# Patient Record
Sex: Male | Born: 1960
Health system: Southern US, Community
[De-identification: ages and names within clinical notes are randomized; demographics above are authoritative.]

## PROBLEM LIST (undated history)

## (undated) DIAGNOSIS — I639 Cerebral infarction, unspecified: Secondary | ICD-10-CM

## (undated) DIAGNOSIS — C61 Malignant neoplasm of prostate: Secondary | ICD-10-CM

## (undated) DIAGNOSIS — G709 Myoneural disorder, unspecified: Secondary | ICD-10-CM

## (undated) DIAGNOSIS — G40909 Epilepsy, unspecified, not intractable, without status epilepticus: Secondary | ICD-10-CM

## (undated) DIAGNOSIS — I6789 Other cerebrovascular disease: Secondary | ICD-10-CM

## (undated) HISTORY — DX: Malignant neoplasm of prostate: C61

---

## 2009-04-27 ENCOUNTER — Ambulatory Visit: Payer: Self-pay

## 2009-05-17 ENCOUNTER — Ambulatory Visit: Payer: Self-pay

## 2009-05-18 ENCOUNTER — Ambulatory Visit: Payer: Self-pay

## 2009-05-28 ENCOUNTER — Ambulatory Visit: Payer: Self-pay

## 2010-03-01 ENCOUNTER — Emergency Department: Payer: Self-pay | Admitting: Emergency Medicine

## 2010-09-09 ENCOUNTER — Emergency Department: Payer: Self-pay | Admitting: Emergency Medicine

## 2010-09-18 ENCOUNTER — Emergency Department: Payer: Self-pay | Admitting: Emergency Medicine

## 2011-05-08 ENCOUNTER — Ambulatory Visit: Payer: Self-pay

## 2011-05-29 ENCOUNTER — Emergency Department: Payer: Self-pay | Admitting: Unknown Physician Specialty

## 2011-08-25 DIAGNOSIS — S5290XA Unspecified fracture of unspecified forearm, initial encounter for closed fracture: Secondary | ICD-10-CM | POA: Diagnosis not present

## 2011-10-23 DIAGNOSIS — S5290XA Unspecified fracture of unspecified forearm, initial encounter for closed fracture: Secondary | ICD-10-CM | POA: Diagnosis not present

## 2011-11-05 ENCOUNTER — Emergency Department: Payer: Self-pay | Admitting: Emergency Medicine

## 2011-11-05 DIAGNOSIS — N201 Calculus of ureter: Secondary | ICD-10-CM | POA: Diagnosis not present

## 2011-11-05 DIAGNOSIS — Z8673 Personal history of transient ischemic attack (TIA), and cerebral infarction without residual deficits: Secondary | ICD-10-CM | POA: Diagnosis not present

## 2011-11-05 DIAGNOSIS — F17200 Nicotine dependence, unspecified, uncomplicated: Secondary | ICD-10-CM | POA: Diagnosis not present

## 2011-11-05 LAB — URINALYSIS, COMPLETE
Bilirubin,UR: NEGATIVE
Ketone: NEGATIVE
Leukocyte Esterase: NEGATIVE
Ph: 5 (ref 4.5–8.0)
RBC,UR: 446 /HPF (ref 0–5)
Squamous Epithelial: <1

## 2011-11-05 LAB — BASIC METABOLIC PANEL
Anion Gap: 8 (ref 7–16)
Calcium, Total: 9 mg/dL (ref 8.5–10.1)
Chloride: 106 mmol/L (ref 98–107)
Creatinine: 0.7 mg/dL (ref 0.60–1.30)
EGFR (African American): >60
EGFR (Non-African Amer.): >60
Glucose: 105 mg/dL — ABNORMAL HIGH (ref 65–99)
Potassium: 3.6 mmol/L (ref 3.5–5.1)

## 2011-11-05 LAB — CBC
HGB: 14.9 g/dL (ref 13.0–18.0)
MCH: 32.6 pg (ref 26.0–34.0)
MCHC: 34 g/dL (ref 32.0–36.0)
MCV: 96 fL (ref 80–100)
Platelet: 177 10*3/uL (ref 150–440)
RBC: 4.57 10*6/uL (ref 4.40–5.90)

## 2011-11-10 DIAGNOSIS — H04129 Dry eye syndrome of unspecified lacrimal gland: Secondary | ICD-10-CM | POA: Insufficient documentation

## 2011-11-10 DIAGNOSIS — D332 Benign neoplasm of brain, unspecified: Secondary | ICD-10-CM | POA: Diagnosis not present

## 2011-11-10 DIAGNOSIS — H47629 Disorders of visual cortex in (due to) inflammatory disorders, unspecified side of brain: Secondary | ICD-10-CM | POA: Diagnosis not present

## 2011-11-10 DIAGNOSIS — H02059 Trichiasis without entropian unspecified eye, unspecified eyelid: Secondary | ICD-10-CM | POA: Insufficient documentation

## 2011-11-13 ENCOUNTER — Ambulatory Visit: Payer: Self-pay | Admitting: Oncology

## 2012-11-06 ENCOUNTER — Emergency Department: Payer: Self-pay | Admitting: Emergency Medicine

## 2012-11-06 DIAGNOSIS — F17200 Nicotine dependence, unspecified, uncomplicated: Secondary | ICD-10-CM | POA: Diagnosis not present

## 2012-11-06 DIAGNOSIS — Z8673 Personal history of transient ischemic attack (TIA), and cerebral infarction without residual deficits: Secondary | ICD-10-CM | POA: Diagnosis not present

## 2012-11-06 DIAGNOSIS — R109 Unspecified abdominal pain: Secondary | ICD-10-CM | POA: Diagnosis not present

## 2012-11-06 DIAGNOSIS — R319 Hematuria, unspecified: Secondary | ICD-10-CM | POA: Diagnosis not present

## 2012-11-06 DIAGNOSIS — R339 Retention of urine, unspecified: Secondary | ICD-10-CM | POA: Diagnosis not present

## 2012-11-06 LAB — URINALYSIS, COMPLETE
Bacteria: NONE SEEN
Bilirubin,UR: NEGATIVE
Glucose,UR: NEGATIVE mg/dL (ref 0–75)
Protein: NEGATIVE
Squamous Epithelial: <1

## 2012-11-06 LAB — BASIC METABOLIC PANEL
Calcium, Total: 8.8 mg/dL (ref 8.5–10.1)
Chloride: 108 mmol/L — ABNORMAL HIGH (ref 98–107)
Co2: 23 mmol/L (ref 21–32)
Creatinine: 0.56 mg/dL — ABNORMAL LOW (ref 0.60–1.30)
EGFR (African American): >60
EGFR (Non-African Amer.): >60
Glucose: 84 mg/dL (ref 65–99)
Osmolality: 277 (ref 275–301)
Potassium: 4 mmol/L (ref 3.5–5.1)

## 2012-11-06 LAB — CBC
MCH: 32.3 pg (ref 26.0–34.0)
MCV: 95 fL (ref 80–100)
RDW: 13.5 % (ref 11.5–14.5)
WBC: 14.6 10*3/uL — ABNORMAL HIGH (ref 3.8–10.6)

## 2014-04-19 DIAGNOSIS — H53459 Other localized visual field defect, unspecified eye: Secondary | ICD-10-CM | POA: Diagnosis not present

## 2014-06-19 DIAGNOSIS — R29898 Other symptoms and signs involving the musculoskeletal system: Secondary | ICD-10-CM | POA: Diagnosis not present

## 2014-06-19 DIAGNOSIS — C719 Malignant neoplasm of brain, unspecified: Secondary | ICD-10-CM | POA: Diagnosis not present

## 2014-06-26 ENCOUNTER — Emergency Department: Payer: Self-pay | Admitting: Student

## 2014-06-26 DIAGNOSIS — N508 Other specified disorders of male genital organs: Secondary | ICD-10-CM | POA: Diagnosis not present

## 2014-06-26 DIAGNOSIS — K8020 Calculus of gallbladder without cholecystitis without obstruction: Secondary | ICD-10-CM | POA: Diagnosis not present

## 2014-06-26 DIAGNOSIS — R109 Unspecified abdominal pain: Secondary | ICD-10-CM | POA: Diagnosis not present

## 2014-06-26 DIAGNOSIS — R319 Hematuria, unspecified: Secondary | ICD-10-CM | POA: Diagnosis not present

## 2014-06-27 DIAGNOSIS — R109 Unspecified abdominal pain: Secondary | ICD-10-CM | POA: Diagnosis not present

## 2014-06-27 DIAGNOSIS — N508 Other specified disorders of male genital organs: Secondary | ICD-10-CM | POA: Diagnosis not present

## 2014-06-27 DIAGNOSIS — K8020 Calculus of gallbladder without cholecystitis without obstruction: Secondary | ICD-10-CM | POA: Diagnosis not present

## 2014-06-27 LAB — COMPREHENSIVE METABOLIC PANEL
ALBUMIN: 3.9 g/dL (ref 3.4–5.0)
ANION GAP: 11 (ref 7–16)
AST: 19 U/L (ref 15–37)
Alkaline Phosphatase: 114 U/L
BILIRUBIN TOTAL: 0.6 mg/dL (ref 0.2–1.0)
BUN: 9 mg/dL (ref 7–18)
CALCIUM: 9.1 mg/dL (ref 8.5–10.1)
CHLORIDE: 101 mmol/L (ref 98–107)
CO2: 24 mmol/L (ref 21–32)
CREATININE: 0.78 mg/dL (ref 0.60–1.30)
EGFR (African American): >60
GLUCOSE: 124 mg/dL — AB (ref 65–99)
Osmolality: 272 (ref 275–301)
Potassium: 3.7 mmol/L (ref 3.5–5.1)
SGPT (ALT): 35 U/L
Sodium: 136 mmol/L (ref 136–145)
TOTAL PROTEIN: 7.3 g/dL (ref 6.4–8.2)

## 2014-06-27 LAB — CBC
HCT: 42.4 % (ref 40.0–52.0)
HGB: 14.4 g/dL (ref 13.0–18.0)
MCH: 31.7 pg (ref 26.0–34.0)
MCHC: 33.8 g/dL (ref 32.0–36.0)
MCV: 94 fL (ref 80–100)
Platelet: 225 10*3/uL (ref 150–440)
RBC: 4.53 10*6/uL (ref 4.40–5.90)
RDW: 13.3 % (ref 11.5–14.5)
WBC: 11 10*3/uL — ABNORMAL HIGH (ref 3.8–10.6)

## 2014-06-27 LAB — URINALYSIS, COMPLETE
Bacteria: NONE SEEN
Bilirubin,UR: NEGATIVE
Glucose,UR: 50 mg/dL (ref 0–75)
Ketone: NEGATIVE
LEUKOCYTE ESTERASE: NEGATIVE
NITRITE: NEGATIVE
PH: 5 (ref 4.5–8.0)
Protein: NEGATIVE
SQUAMOUS EPITHELIAL: NONE SEEN
Specific Gravity: 1.025 (ref 1.003–1.030)
WBC UR: 2 /HPF (ref 0–5)

## 2014-06-28 LAB — URINE CULTURE

## 2014-07-05 DIAGNOSIS — N400 Enlarged prostate without lower urinary tract symptoms: Secondary | ICD-10-CM | POA: Diagnosis not present

## 2014-07-05 DIAGNOSIS — R310 Gross hematuria: Secondary | ICD-10-CM | POA: Diagnosis not present

## 2014-07-12 DIAGNOSIS — G40909 Epilepsy, unspecified, not intractable, without status epilepticus: Secondary | ICD-10-CM | POA: Diagnosis not present

## 2014-07-12 DIAGNOSIS — Z0000 Encounter for general adult medical examination without abnormal findings: Secondary | ICD-10-CM | POA: Diagnosis not present

## 2014-07-12 DIAGNOSIS — C719 Malignant neoplasm of brain, unspecified: Secondary | ICD-10-CM | POA: Diagnosis not present

## 2014-07-12 DIAGNOSIS — R29898 Other symptoms and signs involving the musculoskeletal system: Secondary | ICD-10-CM | POA: Diagnosis not present

## 2014-07-16 DIAGNOSIS — C719 Malignant neoplasm of brain, unspecified: Secondary | ICD-10-CM | POA: Insufficient documentation

## 2014-07-16 DIAGNOSIS — G40909 Epilepsy, unspecified, not intractable, without status epilepticus: Secondary | ICD-10-CM | POA: Insufficient documentation

## 2014-07-16 DIAGNOSIS — R29898 Other symptoms and signs involving the musculoskeletal system: Secondary | ICD-10-CM | POA: Insufficient documentation

## 2014-07-16 DIAGNOSIS — I6789 Other cerebrovascular disease: Secondary | ICD-10-CM

## 2014-08-01 DIAGNOSIS — Z0000 Encounter for general adult medical examination without abnormal findings: Secondary | ICD-10-CM | POA: Diagnosis not present

## 2014-08-15 DIAGNOSIS — R972 Elevated prostate specific antigen [PSA]: Secondary | ICD-10-CM | POA: Diagnosis not present

## 2014-08-22 ENCOUNTER — Ambulatory Visit: Payer: Self-pay | Admitting: Urology

## 2014-09-06 ENCOUNTER — Ambulatory Visit: Payer: Self-pay | Admitting: Urology

## 2014-09-06 DIAGNOSIS — R310 Gross hematuria: Secondary | ICD-10-CM | POA: Diagnosis not present

## 2014-09-06 DIAGNOSIS — Z8673 Personal history of transient ischemic attack (TIA), and cerebral infarction without residual deficits: Secondary | ICD-10-CM | POA: Diagnosis not present

## 2014-09-06 DIAGNOSIS — C7911 Secondary malignant neoplasm of bladder: Secondary | ICD-10-CM | POA: Diagnosis not present

## 2014-09-06 DIAGNOSIS — I69351 Hemiplegia and hemiparesis following cerebral infarction affecting right dominant side: Secondary | ICD-10-CM | POA: Diagnosis not present

## 2014-09-06 DIAGNOSIS — N329 Bladder disorder, unspecified: Secondary | ICD-10-CM | POA: Diagnosis not present

## 2014-09-06 DIAGNOSIS — C61 Malignant neoplasm of prostate: Secondary | ICD-10-CM | POA: Diagnosis not present

## 2014-09-06 DIAGNOSIS — N402 Nodular prostate without lower urinary tract symptoms: Secondary | ICD-10-CM | POA: Diagnosis not present

## 2014-09-06 DIAGNOSIS — N403 Nodular prostate with lower urinary tract symptoms: Secondary | ICD-10-CM | POA: Diagnosis not present

## 2014-09-12 DIAGNOSIS — C61 Malignant neoplasm of prostate: Secondary | ICD-10-CM | POA: Diagnosis not present

## 2014-10-11 ENCOUNTER — Ambulatory Visit: Payer: Self-pay | Admitting: Urology

## 2014-10-11 DIAGNOSIS — M419 Scoliosis, unspecified: Secondary | ICD-10-CM | POA: Diagnosis not present

## 2014-10-11 DIAGNOSIS — M19012 Primary osteoarthritis, left shoulder: Secondary | ICD-10-CM | POA: Diagnosis not present

## 2014-10-11 DIAGNOSIS — C61 Malignant neoplasm of prostate: Secondary | ICD-10-CM | POA: Diagnosis not present

## 2014-10-17 ENCOUNTER — Ambulatory Visit: Payer: Self-pay | Admitting: Urology

## 2014-10-17 DIAGNOSIS — R160 Hepatomegaly, not elsewhere classified: Secondary | ICD-10-CM | POA: Diagnosis not present

## 2014-10-17 DIAGNOSIS — K828 Other specified diseases of gallbladder: Secondary | ICD-10-CM | POA: Diagnosis not present

## 2014-10-17 DIAGNOSIS — C61 Malignant neoplasm of prostate: Secondary | ICD-10-CM | POA: Diagnosis not present

## 2014-10-31 DIAGNOSIS — K7689 Other specified diseases of liver: Secondary | ICD-10-CM | POA: Diagnosis not present

## 2014-10-31 DIAGNOSIS — C61 Malignant neoplasm of prostate: Secondary | ICD-10-CM | POA: Diagnosis not present

## 2014-11-07 ENCOUNTER — Ambulatory Visit
Admit: 2014-11-07 | Disposition: A | Discharge status: Home / Self Care | Payer: Self-pay | Attending: Oncology | Admitting: Oncology

## 2014-11-08 ENCOUNTER — Ambulatory Visit
Admit: 2014-11-08 | Disposition: A | Discharge status: Home / Self Care | Payer: Self-pay | Attending: Oncology | Admitting: Oncology

## 2014-11-08 ENCOUNTER — Ambulatory Visit
Admit: 2014-11-08 | Disposition: A | Discharge status: Home / Self Care | Payer: Self-pay | Attending: Urology | Admitting: Urology

## 2014-11-08 DIAGNOSIS — C787 Secondary malignant neoplasm of liver and intrahepatic bile duct: Secondary | ICD-10-CM | POA: Diagnosis not present

## 2014-11-08 DIAGNOSIS — R911 Solitary pulmonary nodule: Secondary | ICD-10-CM | POA: Diagnosis not present

## 2014-11-08 DIAGNOSIS — J432 Centrilobular emphysema: Secondary | ICD-10-CM | POA: Diagnosis not present

## 2014-11-08 DIAGNOSIS — K769 Liver disease, unspecified: Secondary | ICD-10-CM | POA: Diagnosis not present

## 2014-11-08 DIAGNOSIS — C61 Malignant neoplasm of prostate: Secondary | ICD-10-CM | POA: Diagnosis not present

## 2014-11-08 DIAGNOSIS — K828 Other specified diseases of gallbladder: Secondary | ICD-10-CM | POA: Diagnosis not present

## 2014-11-08 DIAGNOSIS — I2510 Atherosclerotic heart disease of native coronary artery without angina pectoris: Secondary | ICD-10-CM | POA: Diagnosis not present

## 2014-11-08 DIAGNOSIS — C7951 Secondary malignant neoplasm of bone: Secondary | ICD-10-CM | POA: Diagnosis not present

## 2014-11-08 LAB — CBC CANCER CENTER
BASOS ABS: 0.1 x10 3/mm (ref 0.0–0.1)
Basophil %: 0.6 %
Eosinophil #: 0.1 x10 3/mm (ref 0.0–0.7)
Eosinophil %: 0.9 %
HCT: 44.5 % (ref 40.0–52.0)
HGB: 15.1 g/dL (ref 13.0–18.0)
LYMPHS PCT: 17.5 %
Lymphocyte #: 2 x10 3/mm (ref 1.0–3.6)
MCH: 31.2 pg (ref 26.0–34.0)
MCHC: 34 g/dL (ref 32.0–36.0)
MCV: 92 fL (ref 80–100)
Monocyte #: 0.8 x10 3/mm (ref 0.2–1.0)
Monocyte %: 7.4 %
NEUTROS ABS: 8.3 x10 3/mm — AB (ref 1.4–6.5)
Neutrophil %: 73.6 %
PLATELETS: 250 x10 3/mm (ref 150–440)
RBC: 4.84 10*6/uL (ref 4.40–5.90)
RDW: 13.2 % (ref 11.5–14.5)
WBC: 11.2 x10 3/mm — AB (ref 3.8–10.6)

## 2014-11-08 LAB — COMPREHENSIVE METABOLIC PANEL
ANION GAP: 7 (ref 7–16)
Albumin: 4.1 g/dL
Alkaline Phosphatase: 137 U/L — ABNORMAL HIGH
BILIRUBIN TOTAL: 0.5 mg/dL
BUN: 12 mg/dL
CHLORIDE: 105 mmol/L
CO2: 22 mmol/L
CREATININE: 0.59 mg/dL — AB
Calcium, Total: 9.2 mg/dL
EGFR (African American): >60
Glucose: 111 mg/dL — ABNORMAL HIGH
Potassium: 3.7 mmol/L
SGOT(AST): 27 U/L
SGPT (ALT): 26 U/L
Sodium: 134 mmol/L — ABNORMAL LOW
Total Protein: 7.3 g/dL

## 2014-11-08 LAB — APTT: Activated PTT: 28.9 secs (ref 23.6–35.9)

## 2014-11-08 LAB — PROTIME-INR
INR: 0.9
Prothrombin Time: 12.8 secs

## 2014-11-09 LAB — AFP TUMOR MARKER: AFP-Tumor Marker: 2.9 ng/mL

## 2014-11-09 LAB — CEA: CEA: 2.6 ng/mL

## 2014-11-20 ENCOUNTER — Ambulatory Visit
Admit: 2014-11-20 | Disposition: A | Discharge status: Home / Self Care | Payer: Self-pay | Attending: Oncology | Admitting: Oncology

## 2014-11-20 DIAGNOSIS — R160 Hepatomegaly, not elsewhere classified: Secondary | ICD-10-CM | POA: Diagnosis not present

## 2014-11-20 DIAGNOSIS — C61 Malignant neoplasm of prostate: Secondary | ICD-10-CM | POA: Diagnosis not present

## 2014-11-20 DIAGNOSIS — F17200 Nicotine dependence, unspecified, uncomplicated: Secondary | ICD-10-CM | POA: Diagnosis not present

## 2014-11-20 DIAGNOSIS — K769 Liver disease, unspecified: Secondary | ICD-10-CM | POA: Diagnosis not present

## 2014-11-20 DIAGNOSIS — Z8673 Personal history of transient ischemic attack (TIA), and cerebral infarction without residual deficits: Secondary | ICD-10-CM | POA: Diagnosis not present

## 2014-11-20 DIAGNOSIS — Z85841 Personal history of malignant neoplasm of brain: Secondary | ICD-10-CM | POA: Diagnosis not present

## 2014-11-20 DIAGNOSIS — K7581 Nonalcoholic steatohepatitis (NASH): Secondary | ICD-10-CM | POA: Diagnosis not present

## 2014-11-20 LAB — SURGICAL PATHOLOGY

## 2014-11-23 ENCOUNTER — Ambulatory Visit
Admit: 2014-11-23 | Disposition: A | Discharge status: Home / Self Care | Payer: Self-pay | Attending: Oncology | Admitting: Oncology

## 2014-11-23 LAB — SURGICAL PATHOLOGY

## 2014-11-24 ENCOUNTER — Other Ambulatory Visit: Payer: Self-pay | Admitting: Oncology

## 2014-11-24 DIAGNOSIS — K769 Liver disease, unspecified: Secondary | ICD-10-CM

## 2014-11-26 NOTE — Op Note (Signed)
PATIENT NAME:  Roger Sparks, Roger Sparks MR#:  [id number] DATE OF BIRTH:  Dec 05, 1960  DATE OF PROCEDURE:  09/06/2014  PREOPERATIVE DIAGNOSIS: Nodular prostate, hematuria.   POSTOPERATIVE DIAGNOSES: Nodular prostate, hematuria, bladder and prostatic urethral mass.     PROCEDURE PERFORMED:  Cystoscopy, bladder prostatic urethral biopsy, transrectal ultrasound guided prostate biopsy.   ANESTHESIA: General anesthesia.   ATTENDING SURGEON: Sherlynn Stalls, MD.   ESTIMATED BLOOD LOSS: Minimal.   DRAINS: None.   COMPLICATIONS: None.   SPECIMENS: Bladder/prostatic urethral biopsy, standard 12 core prostate biopsy, 4 random prostate biopsies.   INDICATION: This is a 54 year old male who presented for evaluation for a history of nodular prostate and elevated PSA to 6.2, he also has a history of hematuria. He underwent attempted transrectal biopsy of his prostate in the office, however was unable to tolerate this and now presents to have this done under anesthesia in the operating room. Risks and benefits of the procedure were explained in detail to the patient who agreed to proceed as planned.   PROCEDURE: The patient was correctly identified in the preoperative holding area and informed consent was confirmed. He was brought to the operating suite and placed on the table in the supine position. At this time universal timeout protocol was performed. All team members were identified. Venodyne boots were placed and he was administered 500 mg of IV Levaquin and 80 mg of IV gentamicin in the preoperative period. He was then placed under general anesthesia, repositioned lower in the bed in the dorsal lithotomy position, and prepped and draped in standard surgical fashion. Given his history of hematuria the plan was to proceed with cystoscopy and bilateral retrogrades.  At this point in time a rigid cystoscope using a 42 French access sheath was advanced per urethra into the bladder.  Within the prostatic urethra there  was an unusual-appearing prostatic fossa mass on the patient's left side, which appeared whitish-colored, round, circular, cluster type lesion with a mucosal overlay. This mass was large and had some good mass effect within the prostatic urethra. This extended all the way up into the bladder neck on the left side. Within the bladder several additional satellite lesions of this mass were seen on the right hemi-trigone which were on a stalk with this white clustered appearance covered in mucosa, they appeared much like fish eggs in appearance, there were 2-3 satellite lesions within the bladder in addition to the prostatic urethra. The remainder of the bladder was unremarkable. Given the mass effect and these lesions I was unable to identify the UOs to do bilateral retrograde pyelogram. At this point in time we did go ahead and biopsy several areas using cold cup biopsy forceps. The base of these lesions were fulgurated using Bugbee for hemostasis. I did not completely transect these lesions as given the unusual appearance it was very unclear what the pathologic diagnosis was at this point and also unclear if he would benefit from resection at this time given the good amount of hematuria at this point despite the Bugbee.  I did go ahead and place an 26 [place] Coude catheter for the purpose of urinary drainage and clot irrigation. The scope was then removed. The patient was repositioned in the lateral decubitus position with the left side down and his knees were brought to his chest. A transrectal ultrasound probe was then introduced per rectum. At this point in time due to some technical difficulties with the ultrasound probe we went ahead and took four random prostate biopsy  cores which were passed off the field as random prostate biopsies. After some adjustment to the ultrasound device we were eventually able to get the image adjusted in order to perform a standard 12 core prostate biopsy including 6 biopsies on the  left and right, starting at the base down to the apex including lateral cores.  I was unable to obtain a prostate volume at this time. Of note prior to performing the transrectal ultrasound I did perform a rectal exam which was grossly abnormal, firm prostate with multiple concerning nodules throughout the prostate bilaterally. The prostate was approximately 50 mL on examination. The patient was then returned to the supine position, reversed from anesthesia and taken to the PACU in stable condition. Of note I did remove the Foley catheter prior to extubating the patient as his urine had nearly completely cleared and the catheter was not deemed necessary.   PLAN: The patient will follow up in 1 week to discuss his pathology results. He will also eventually need a CT urogram to evaluate his upper tracts as this workup was incomplete given the inability to identify the UOs bilaterally. Of note in the PACU the patient did void a small amount, but had a markedly elevated PVR to nearly 700 mL, therefore a 16 French Coude catheter was replaced. This will be removed at the time of followup next week.     ____________________________ Sherlynn Stalls, MD ajb:bu D: 09/07/2014 15:07:56 ET T: 09/07/2014 19:35:52 ET JOB#: [id number]  cc: Sherlynn Stalls, MD, <Dictator> Sherlynn Stalls MD ELECTRONICALLY SIGNED 09/19/2014 12:37

## 2014-11-28 ENCOUNTER — Other Ambulatory Visit: Payer: Self-pay | Admitting: Radiology

## 2014-11-28 ENCOUNTER — Other Ambulatory Visit: Payer: Self-pay | Admitting: Interventional Radiology

## 2014-11-29 ENCOUNTER — Ambulatory Visit
Admission: RE | Admit: 2014-11-29 | Discharge: 2014-11-29 | Disposition: A | Discharge status: Home / Self Care | Payer: Medicare Other | Source: Ambulatory Visit | Attending: Oncology | Admitting: Oncology

## 2014-11-29 DIAGNOSIS — C787 Secondary malignant neoplasm of liver and intrahepatic bile duct: Secondary | ICD-10-CM | POA: Diagnosis not present

## 2014-11-29 DIAGNOSIS — G709 Myoneural disorder, unspecified: Secondary | ICD-10-CM | POA: Insufficient documentation

## 2014-11-29 DIAGNOSIS — K769 Liver disease, unspecified: Secondary | ICD-10-CM | POA: Diagnosis not present

## 2014-11-29 DIAGNOSIS — C61 Malignant neoplasm of prostate: Secondary | ICD-10-CM | POA: Diagnosis not present

## 2014-11-29 DIAGNOSIS — Z8546 Personal history of malignant neoplasm of prostate: Secondary | ICD-10-CM | POA: Diagnosis not present

## 2014-11-29 DIAGNOSIS — Z8673 Personal history of transient ischemic attack (TIA), and cerebral infarction without residual deficits: Secondary | ICD-10-CM | POA: Insufficient documentation

## 2014-11-29 DIAGNOSIS — K7689 Other specified diseases of liver: Secondary | ICD-10-CM | POA: Diagnosis present

## 2014-11-29 HISTORY — DX: Myoneural disorder, unspecified: G70.9

## 2014-11-29 HISTORY — DX: Cerebral infarction, unspecified: I63.9

## 2014-11-29 MED ORDER — FENTANYL CITRATE (PF) 100 MCG/2ML IJ SOLN
INTRAMUSCULAR | Status: AC
  Filled 2014-11-29: qty 2

## 2014-11-29 MED ORDER — MIDAZOLAM HCL 5 MG/5ML IJ SOLN
INTRAMUSCULAR | Status: AC | PRN
  Administered 2014-11-29 (×2): 1 mg via INTRAVENOUS

## 2014-11-29 MED ORDER — FENTANYL CITRATE (PF) 100 MCG/2ML IJ SOLN
INTRAMUSCULAR | Status: AC | PRN
  Administered 2014-11-29: 50 ug via INTRAVENOUS

## 2014-11-29 MED ORDER — MIDAZOLAM HCL 5 MG/5ML IJ SOLN
INTRAMUSCULAR | Status: AC
  Filled 2014-11-29: qty 5

## 2014-11-29 MED ORDER — SODIUM CHLORIDE 0.9 % IV SOLN
INTRAVENOUS | Status: DC
  Administered 2014-11-29: 09:00:00 via INTRAVENOUS

## 2014-11-29 NOTE — Consult Note (Signed)
Chief Complaint: No chief complaint on file.   Referring Physician(s): Choksi,Janak  History of Present Illness: Roger Sparks. is a 54 y.o. male with liver lesions. He underwent recent liver biopsy which was negative for malignancy. Repeat biopsy is requested.  Past Medical History  Diagnosis Date  . Stroke     at 54 years of age  . Neuromuscular disorder     brain tumor at 54 years of age  . Cancer     No past surgical history on file.  Allergies: Review of patient's allergies indicates no known allergies.  Medications: none Prior to Admission medications   Not on File     No family history on file.  History   Social History  . Marital Status: Married    Spouse Name: N/A  . Number of Children: N/A  . Years of Education: N/A   Social History Main Topics  . Smoking status: Not on file  . Smokeless tobacco: Not on file  . Alcohol Use: Not on file  . Drug Use: Not on file  . Sexual Activity: Not on file   Other Topics Concern  . Not on file   Social History Narrative  . No narrative on file     Review of Systems: A 12 point ROS discussed and pertinent positives are indicated in the HPI above.  All other systems are negative.  Review of Systems  Vital Signs: BP 125/91 mmHg  Pulse 95  Temp(Src) 98.4 F (36.9 C) (Oral)  Ht 5\' 7"  (1.702 m)  Wt 175 lb (79.379 kg)  BMI 27.40 kg/m2  Physical Exam  Constitutional: He is oriented to person, place, and time. He appears well-developed and well-nourished.  HENT:  Head: Normocephalic.  Neck: Normal range of motion. Neck supple.  Cardiovascular: Normal rate and regular rhythm.   Pulmonary/Chest: Effort normal and breath sounds normal.  Neurological: He is alert and oriented to person, place, and time.  RUE weakness  Skin: Skin is warm and dry.    Mallampati Score: 1    Imaging: US Thyroid Biopsy  11/20/2014   CLINICAL DATA:  Hepatic lesions. Current history of prostate cancer.  EXAM:  ULTRASOUND GUIDED core BIOPSY OF right hepatic lobe lesion.  MEDICATIONS: 3.0 mg IV Versed; 125 mcg IV Fentanyl  Total Moderate Sedation Time: 30 minutes.  PROCEDURE: The procedure, risks, benefits, and alternatives were explained to the patient, including infection, bleeding, pain and pneumothorax requiring chest tube placement and hospitalization. Questions regarding the procedure were encouraged and answered. The patient understands and consents to the procedure.  The right mid axillary line region was prepped with chlorhexidine in a sterile fashion, and a sterile drape was applied covering the operative field. Sterile gloves were used for the procedure. Local anesthesia was provided with 1% Lidocaine.  Under real-time ultrasound guidance, 17 gauge guiding needle was directed toward lesion in right hepatic lobe seen on prior CT scan. The needle appeared to be directed toward the inferior portion of the lesion. Two core samples were obtained using 18 gauge needle. Attempts were then made to direct the needle toward the more central portion of the lesion. However, the bulk of the lesion was underneath the rib making visualization difficult at the time of the procedure, and the procedure was discontinued as patient was developing abdominal pain. Consideration was then given to performing biopsy under CT guidance, but the scanner was not available as it was not operational today due to maintenance issues. Also,  Gel-Foam was not administered as the needle came out of hepatic parenchyma in attempting to redirect the needle tip. Appropriate dressing was applied.  COMPLICATIONS: Patient developed pain during procedure. Vital signs remain stable throughout the procedure.  FINDINGS: Right hepatic lesion as seen on prior CT scan.  IMPRESSION: Under real-time ultrasound guidance, biopsy was performed of right hepatic lesion. However, it was difficult to completely visualize lesion due to its position underneath rib. Two core  samples were obtained of what appears to be the inferior portion of the lesion. See above regarding attempts to direct more centrally. If pathology results are negative for malignancy or neoplasm, repeat biopsy under CT guidance with then be recommended.   Electronically Signed   By: Marijo Conception, M.D.   On: 11/20/2014 09:54    Labs:  CBC:  Recent Labs  06/27/14 0222 11/08/14 1009  WBC 11.0* 11.2*  HGB 14.4 15.1  HCT 42.4 44.5  PLT 225 250    COAGS:  Recent Labs  11/08/14 1009  INR 0.9  APTT 28.9    BMP:  Recent Labs  06/27/14 0222 11/08/14 1009  NA 136 134*  K 3.7 3.7  CL 101 105  CO2 24 22  GLUCOSE 124* 111*  BUN 9 12  CALCIUM 9.1 9.2  CREATININE 0.78 0.59*  GFRNONAA  --  >60  GFRAA  --  >60    LIVER FUNCTION TESTS:  Recent Labs  06/27/14 0222 11/08/14 1009  AST 19 27  ALT 35 26  ALKPHOS 114 137*  PROT 7.3 7.3  ALBUMIN 3.9 4.1    TUMOR MARKERS:  Recent Labs  11/08/14 1009  AFPTM 2.9  CEA 2.6    Assessment and Plan:  Here for liver biopsy.  Thank you for this interesting consult.  I greatly enjoyed meeting Roger Sparks. and look forward to participating in their care.  Signed: Anuj Summons, ART A 11/29/2014, 7:53 AM   I spent a total of 10 Minutes in face to face in clinical consultation, greater than 50% of which was counseling/coordinating care for liver lesions.

## 2014-11-29 NOTE — Procedures (Signed)
R lobe liver lesion Bx 18 g core times three No comp

## 2014-12-04 LAB — SURGICAL PATHOLOGY

## 2014-12-07 ENCOUNTER — Inpatient Hospital Stay: Payer: Medicare Other | Attending: Oncology | Admitting: Oncology

## 2014-12-07 ENCOUNTER — Telehealth: Payer: Self-pay

## 2014-12-07 VITALS — BP 114/84 | HR 90 | Wt 163.6 lb

## 2014-12-07 DIAGNOSIS — K7581 Nonalcoholic steatohepatitis (NASH): Secondary | ICD-10-CM | POA: Diagnosis not present

## 2014-12-07 DIAGNOSIS — C787 Secondary malignant neoplasm of liver and intrahepatic bile duct: Secondary | ICD-10-CM | POA: Diagnosis not present

## 2014-12-07 DIAGNOSIS — F17210 Nicotine dependence, cigarettes, uncomplicated: Secondary | ICD-10-CM

## 2014-12-07 DIAGNOSIS — Z8673 Personal history of transient ischemic attack (TIA), and cerebral infarction without residual deficits: Secondary | ICD-10-CM | POA: Insufficient documentation

## 2014-12-07 DIAGNOSIS — C61 Malignant neoplasm of prostate: Secondary | ICD-10-CM | POA: Diagnosis not present

## 2014-12-07 DIAGNOSIS — Z85841 Personal history of malignant neoplasm of brain: Secondary | ICD-10-CM | POA: Diagnosis not present

## 2014-12-07 DIAGNOSIS — K769 Liver disease, unspecified: Secondary | ICD-10-CM | POA: Diagnosis not present

## 2014-12-07 NOTE — Telephone Encounter (Signed)
Called Mr Pickrel to confirm appt today at 11:00. Pt confirmed. Readback performed.

## 2014-12-07 NOTE — Progress Notes (Signed)
At present Roger Sparks is refusing any further treatment, including his Lupron injections, stating he doesn't want anything else in his body. Educated him at length about port a caths and how they used. Showed and allowed him to handle a port a cath model. Explained process of accessing the port including numbing skin being accessing. Spoke with his wife, who states she wants him to get treatment but that it is his decision. He is to think about it and call me if he changes his mind regarding treatment. Notified Dr Erlene Quan as well.

## 2014-12-09 ENCOUNTER — Encounter: Payer: Self-pay | Admitting: Oncology

## 2014-12-09 DIAGNOSIS — C61 Malignant neoplasm of prostate: Secondary | ICD-10-CM

## 2014-12-09 DIAGNOSIS — C7951 Secondary malignant neoplasm of bone: Secondary | ICD-10-CM

## 2014-12-09 HISTORY — DX: Malignant neoplasm of prostate: C61

## 2014-12-09 NOTE — Progress Notes (Signed)
Roger Sparks @ [facility] [phone]  Fax:(336) [place]. OB: 01/27/1961  MR#: [id number]  JYN#:[id number]  Patient Care Team: No Pcp Per Patient as PCP - General (General Practice) Clent Jacks, RN as Registered Nurse  CHIEF COMPLAINT:  Chief Complaint  Patient presents with  . Follow-up    Oncology History   Carcinoma prostate adenocarcinoma in multiple fragments.  Gleason grade 8.  Involving neutral space.  Large duct and S cylinder-type.  Poorly differentiated.  Lateral neck involvement.Marland Kitchen PSAs low 2.4.  CT scan of abdomen was abnormal with multiple liver lesions.  Diagnosis in April of 2016 Clinical staging T1 cN0 M1 stage IV disease 2, CT-guided liver biopsies consistent with metastases from the prostate cancer        Cancer of prostate   12/09/2014 Initial Diagnosis Cancer of prostate    No flowsheet data found.  INTERVAL HISTORY: 54 year old gentleman went to liver biopsy which was consistent with metastases from the prostate gland came today for the follow-up.  Patient has a progressive disease even on Lupron.  Patient does not want androgen deprivation therapy.  Says that it feels like cement going to his body.  A very difficult patient with lack of understanding of the disease process. Here to discuss the results and further planning of treatment  REVIEW OF SYSTEMS:   Gen. status: Very angry and frustrated patient not any acute distress HEENT: No difficulty swallowing no soreness in the mouth.  No headache.  Cardiac: No chest pain or shortness of breath lungs: No cough.  Continues to smoke.  GI: No nausea no vomiting or diarrhea GU: No hematuria no dysuria skin: No rash.  Musculoskeletal system no bony As per HPI. Otherwise, a complete review of systems is negatve neurological system: Weakness in the right upper and lower extremity.  PAST MEDICAL HISTORY: Past Medical History  Diagnosis Date  . Stroke     at 54 years of age  .  Neuromuscular disorder     brain tumor at 54 years of age  . Cancer   . Cancer of prostate 12/09/2014    PAST SURGICAL HISTORY: No past surgical history on file.  FAMILY HISTORY No family history on file.  GYNECOLOGIC HISTORY:  No LMP for male patient.     ADVANCED DIRECTIVES:   does not have advanced directive  HEALTH MAINTENANCE: History  Substance Use Topics  . Smoking status: Current Every Day Smoker -- 0.50 packs/day    Types: Cigarettes  . Smokeless tobacco: Not on file  . Alcohol Use: No     Colonoscopy:  PAP:  Bone density:  Lipid panel:  No Known Allergies  No current outpatient prescriptions on file.   No current facility-administered medications for this visit.    OBJECTIVE:  Filed Vitals:   12/07/14 1109  BP: 114/84  Pulse: 90     Body mass index is 25.61 kg/(m^2).    ECOG FS:1 - Symptomatic but completely ambulatory  PHYSICAL EXAM: GENERAL:  Well developed, well nourished, sitting comfortably in the exam room in no acute distress. MENTAL STATUS:  Alert and oriented to person, place and time. HEAD:  No abnormality detected ENT:  Oropharynx clear without lesion.  Tongue normal. Mucous membranes moist.  RESPIRATORY:  Clear to auscultation without rales, wheezes or rhonchi. CARDIOVASCULAR:  Regular rate and rhythm without murmur, rub or gallop. . ABDOMEN:  Soft, non-tender, with active bowel sounds, and no hepatosplenomegaly.  No masses. BACK:  No  CVA tenderness.  No tenderness on percussion of the back or rib cage. SKIN:  No rashes, ulcers or lesions. EXTREMITIES: No edema, no skin discoloration or tenderness.  No palpable cords. LYMPH NODES: No palpable cervical, supraclavicular, axillary or inguinal adenopathy  NEUROLOGICAL: Weakness in the right side PSYCH:  Appropriate.   LAB RESULTS:  No visits with results within 3 Day(s) from this visit. Latest known visit with results is:  Hospital Outpatient Visit on 11/20/2014  Component Date  Value Ref Range Status  . SURGICAL PATHOLOGY 11/20/2014    Final                   Value:Surgical Procedure CASE: [id number] PATIENT: Roger Sparks Surgical Pathology Report     SPECIMEN SUBMITTED: A. Liver, biopsy  CLINICAL HISTORY: None provided  PRE-OPERATIVE DIAGNOSIS:   POST-OPERATIVE DIAGNOSIS: None provided     DIAGNOSIS: LIVER MASS; NEEDLE BIOPSIES: - STEATOHEPATITIS WITH MILD ACTIVITY. - INCREASED STAINABLE IRON (3+/4+) IN HEPATOCYTES AND KUPFFER CELLS. - ANISONUCLEOSIS OF HEPATOCYTES, MILD TO MODERATE. - NO INCREASED FIBROSIS OR MALIGNANCY SEEN.  Comment: The following stains were performed and the results incorporated into the final diagnosis: iron, trichrome, and reticulin. The controls stain appropriately.   GROSS DESCRIPTION: A. Labeled: Liver mass Tissue Fragment(s): 2 Measurement: 1.2-1.5 cm in length by 0.1 cm in diameter Comment: Pink red cylindrically shaped tissue fragments  Entirely submitted in cassette(s): 1-2    Final Diagnosis performed by Hassan Buckler, MD.  Electronically signed 11/23/2014 12:31:2                         8PM    The electronic signature indicates that the named Attending Pathologist has evaluated the specimen  Technical component performed at [facility], [address], [place], [place] [address] Lab: [phone] Dir: Darrick Penna. Evette Doffing, MD  Professional component performed at [facility], [facility], [place], [place], [place] [address] Lab: [phone] Dir: Dellia Nims. Rubinas, MD      No results found for: LABCA2 No results found for: CA199 Lab Results  Component Value Date   CEA 2.6 11/08/2014   No results found for: PSA No results found for: CA125   STUDIES: US Thyroid Biopsy  11/20/2014   CLINICAL DATA:  Hepatic lesions. Current history of prostate cancer.  EXAM: ULTRASOUND GUIDED core BIOPSY OF right hepatic lobe lesion.  MEDICATIONS: 3.0 mg IV Versed; 125 mcg IV  Fentanyl  Total Moderate Sedation Time: 30 minutes.  PROCEDURE: The procedure, risks, benefits, and alternatives were explained to the patient, including infection, bleeding, pain and pneumothorax requiring chest tube placement and hospitalization. Questions regarding the procedure were encouraged and answered. The patient understands and consents to the procedure.  The right mid axillary line region was prepped with chlorhexidine in a sterile fashion, and a sterile drape was applied covering the operative field. Sterile gloves were used for the procedure. Local anesthesia was provided with 1% Lidocaine.  Under real-time ultrasound guidance, 17 gauge guiding needle was directed toward lesion in right hepatic lobe seen on prior CT scan. The needle appeared to be directed toward the inferior portion of the lesion. Two core samples were obtained using 18 gauge needle. Attempts were then made to direct the needle toward the more central portion of the lesion. However, the bulk of the lesion was underneath the rib making visualization difficult at the time of the procedure, and the procedure was discontinued as patient was developing abdominal pain. Consideration was  then given to performing biopsy under CT guidance, but the scanner was not available as it was not operational today due to maintenance issues. Also, Gel-Foam was not administered as the needle came out of hepatic parenchyma in attempting to redirect the needle tip. Appropriate dressing was applied.  COMPLICATIONS: Patient developed pain during procedure. Vital signs remain stable throughout the procedure.  FINDINGS: Right hepatic lesion as seen on prior CT scan.  IMPRESSION: Under real-time ultrasound guidance, biopsy was performed of right hepatic lesion. However, it was difficult to completely visualize lesion due to its position underneath rib. Two core samples were obtained of what appears to be the inferior portion of the lesion. See above regarding  attempts to direct more centrally. If pathology results are negative for malignancy or neoplasm, repeat biopsy under CT guidance with then be recommended.   Electronically Signed   By: Marijo Conception, M.D.   On: 11/20/2014 09:54   Ct Biopsy  11/29/2014   CLINICAL DATA:  liver lesion  EXAM: CT-GUIDED BIOPSY OF A RIGHT LOBE LIVER LESION.  CORE.  MEDICATIONS AND MEDICAL HISTORY: Versed two mg, Fentanyl 50 mcg.  Additional Medications: None.  ANESTHESIA/SEDATION: Moderate sedation time: 10 minutes  PROCEDURE: The procedure, risks, benefits, and alternatives were explained to the patient. Questions regarding the procedure were encouraged and answered. The patient understands and consents to the procedure.  The right upper quadrant was prepped with Betadine in a sterile fashion, and a sterile drape was applied covering the operative field. A sterile gown and sterile gloves were used for the procedure.  Under CT guidance, a(n) 17 gauge guide needle was advanced into the right lobe liver lesion. Subsequently 3 18 gauge core biopsies were obtained. Gel-Foam slurry was injected into the tract. The guide needle was removed. Final imaging was performed.  Patient tolerated the procedure well without complication. Vital sign monitoring by nursing staff during the procedure will continue as patient is in the special procedures unit for post procedure observation.  FINDINGS: The images document guide needle placement within the right lobe liver lesion. Post biopsy images demonstrate no hemorrhage.  COMPLICATIONS: None  IMPRESSION: Successful CT-guided core biopsy of a right lobe liver lesion.   Electronically Signed   By: Marybelle Killings M.D.   On: 11/29/2014 12:59    ASSESSMENT: Carcinoma prostate with metastases to liver biopsy on May 4 is consistent with prostatitic carcinoma. Previous history of glioma  MEDICAL DECISION MAKING:  I reviewed pathology report I discussed situation with Dr. Elson Areas pathologist.   Started discussing possibility of requiring chemotherapy patient was adamant that does not want to get a port placed.  Patient does not want any androgen deprivation therapy.  We went on discussing all options possibility of using ZYTIGA or XTANDI should be considered.  Taxotere would be a better choice at present time in addition to androgen deprivation therapy After prolonged discussion patient and family to make decision. Total duration of visit was 45 minutes.  50% or more time was spent in counseling patient and family regarding prognosis and options of treatment and available resources  Patient expressed understanding and was in agreement with this plan. He also understands that He can call clinic at any time with any questions, concerns, or complaints.    Cancer of prostate   Staging form: Prostate, AJCC 7th Edition     Clinical: Stage IV (T1c, N0, M1, PSA: Less than 10, Gleason 8-10 - Poorly differentiated/undifferentiated (marked anaplasia)) - Signed by Forest Gleason, MD  on 12/09/2014   Forest Gleason, MD   12/09/2014 8:00 AM

## 2014-12-13 ENCOUNTER — Telehealth: Payer: Self-pay

## 2014-12-13 ENCOUNTER — Inpatient Hospital Stay (HOSPITAL_BASED_OUTPATIENT_CLINIC_OR_DEPARTMENT_OTHER): Payer: Medicare Other | Admitting: Oncology

## 2014-12-13 VITALS — BP 121/85 | HR 73 | Temp 96.6°F | Wt 163.4 lb

## 2014-12-13 DIAGNOSIS — K7581 Nonalcoholic steatohepatitis (NASH): Secondary | ICD-10-CM

## 2014-12-13 DIAGNOSIS — Z8673 Personal history of transient ischemic attack (TIA), and cerebral infarction without residual deficits: Secondary | ICD-10-CM

## 2014-12-13 DIAGNOSIS — Z85841 Personal history of malignant neoplasm of brain: Secondary | ICD-10-CM

## 2014-12-13 DIAGNOSIS — C787 Secondary malignant neoplasm of liver and intrahepatic bile duct: Secondary | ICD-10-CM | POA: Diagnosis not present

## 2014-12-13 DIAGNOSIS — C61 Malignant neoplasm of prostate: Secondary | ICD-10-CM

## 2014-12-13 DIAGNOSIS — F17210 Nicotine dependence, cigarettes, uncomplicated: Secondary | ICD-10-CM | POA: Diagnosis not present

## 2014-12-13 DIAGNOSIS — K769 Liver disease, unspecified: Secondary | ICD-10-CM | POA: Diagnosis not present

## 2014-12-13 NOTE — Telephone Encounter (Signed)
Oncology Nurse Navigator Documentation  Oncology Nurse Navigator Flowsheets 12/13/2014  Navigator Encounter Type Telephone  Time Spent with Patient 30   Spoke with Roger Sparks on the phone. He is able to come in today to discuss Zytiga with Dr Oliva Bustard as a treatment option. Appt arranged for 1530. He was made aware.

## 2014-12-20 ENCOUNTER — Telehealth: Payer: Self-pay | Admitting: *Deleted

## 2014-12-20 DIAGNOSIS — C61 Malignant neoplasm of prostate: Secondary | ICD-10-CM

## 2014-12-20 NOTE — Telephone Encounter (Signed)
Start zytiga and prednisone today, appt to see md in 4 weeks with CBC, METC , PSA. Returned call to wife who said he will start it in the morning and will wait to here from scheduler for appt

## 2014-12-24 ENCOUNTER — Encounter: Payer: Self-pay | Admitting: Oncology

## 2014-12-24 NOTE — Progress Notes (Signed)
[place] @ [facility] [phone]  Fax:([phone] [place]. OB: [birth year]  MR#: [id number]  JQZ#:[id number]  Patient Care Team: No Pcp Per Patient as PCP - General (General Practice) Clent Jacks, RN as Registered Nurse  CHIEF COMPLAINT:  Chief Complaint  Patient presents with  . Follow-up    Oncology History   Carcinoma prostate adenocarcinoma in multiple fragments.  Gleason grade 8.  Involving neutral space.  Large duct and S cylinder-type.  Poorly differentiated.  Lateral neck involvement.Marland Kitchen PSAs low 2.4.  CT scan of abdomen was abnormal with multiple liver lesions.  Diagnosis in April of 2016 Clinical staging T1 cN0 M1 stage IV disease 2, CT-guided liver biopsies consistent with metastases from the prostate cancer        Cancer of prostate   12/09/2014 Initial Diagnosis Cancer of prostate    No flowsheet data found.  INTERVAL HISTORY:   Street 54 year old gentleman with a history of stage IV carcinoma of prostate refuses to get androgen deprivation therapy with Lupron.  Patient refuses port placement and unwilling to get Taxotere chemotherapy Here to discuss further planning of treatment. REVIEW OF SYSTEMS:   GENERAL:  Feels good.  Active.  No fevers, sweats or weight loss. PERFORMANCE STATUS (ECOG):01 HEENT:  No visual changes, runny nose, sore throat, mouth sores or tenderness. Lungs: No shortness of breath or cough.  No hemoptysis. Cardiac:  No chest pain, palpitations, orthopnea, or PND. GI:  No nausea, vomiting, diarrhea, constipation, melena or hematochezia. GU:  No urgency, frequency, dysuria, or hematuria. Musculoskeletal:  No back pain.  No joint pain.  No muscle tenderness. Extremities:  No pain or swelling. Skin:  No rashes or skin changes. Neuro:  No headache, numbness or weakness, balance or coordination issues. Endocrine:  No diabetes, thyroid issues, hot flashes or night sweats. Psych:  No mood changes, depression or  anxiety. Pain:  No focal pain. Review of systems:  All other systems reviewed and found to be negative.  As per HPI. Otherwise, a complete review of systems is negatve.  PAST MEDICAL HISTORY: Past Medical History  Diagnosis Date  . Stroke     at 54 years of age  . Neuromuscular disorder     brain tumor at 54 years of age  . Cancer   . Cancer of prostate 12/09/2014    PAST SURGICAL HISTORY: No past surgical history on file.  FAMILY HISTORY No family history on file.  GYNECOLOGIC HISTORY:  No LMP for male patient.     ADVANCED DIRECTIVES:    HEALTH MAINTENANCE: History  Substance Use Topics  . Smoking status: Current Every Day Smoker -- 0.50 packs/day    Types: Cigarettes  . Smokeless tobacco: Not on file  . Alcohol Use: No     Colonoscopy:  PAP:  Bone density:  Lipid panel:  No Known Allergies  No current outpatient prescriptions on file.   No current facility-administered medications for this visit.    OBJECTIVE:  Filed Vitals:   12/13/14 1539  BP: 121/85  Pulse: 73  Temp: 96.6 F (35.9 C)     Body mass index is 25.58 kg/(m^2).    ECOG FS:0 - Asymptomatic  PHYSICAL EXAM Goal status: Performance status is good.  Patient has not lost significant weight HEENT: No evidence of stomatitis. Sclera and conjunctivae :: No jaundice.   pale looking. Lungs: Air  entry equal on both sides.  No rhonchi.  No rales.  Cardiac: Heart sounds are  normal.  No pericardial rub.  No murmur. Lymphatic system: Cervical, axillary, inguinal, lymph nodes not palpable GI: Abdomen is soft.  No ascites.  Liver spleen not palpable.  No tenderness.  Bowel sounds are within normal limit Lower extremity: No edema Neurological system: Higher functions, cranial nerves intact no evidence of peripheral neuropathy. Skin: No rash.  No ecchymosis.Marland Kitchen   LAB RESULTS:  No visits with results within 3 Day(s) from this visit. Latest known visit with results is:  Hospital Outpatient Visit on  11/20/2014  Component Date Value Ref Range Status  . SURGICAL PATHOLOGY 11/20/2014    Final                   Value:Surgical Procedure CASE: [id number] PATIENT: Theodosia Paling Surgical Pathology Report     SPECIMEN SUBMITTED: A. Liver, biopsy  CLINICAL HISTORY: None provided  PRE-OPERATIVE DIAGNOSIS:   POST-OPERATIVE DIAGNOSIS: None provided     DIAGNOSIS: LIVER MASS; NEEDLE BIOPSIES: - STEATOHEPATITIS WITH MILD ACTIVITY. - INCREASED STAINABLE IRON (3+/4+) IN HEPATOCYTES AND KUPFFER CELLS. - ANISONUCLEOSIS OF HEPATOCYTES, MILD TO MODERATE. - NO INCREASED FIBROSIS OR MALIGNANCY SEEN.  Comment: The following stains were performed and the results incorporated into the final diagnosis: iron, trichrome, and reticulin. The controls stain appropriately.   GROSS DESCRIPTION: A. Labeled: Liver mass Tissue Fragment(s): 2 Measurement: 1.2-1.5 cm in length by 0.1 cm in diameter Comment: Pink red cylindrically shaped tissue fragments  Entirely submitted in cassette(s): 1-2    Final Diagnosis performed by Hassan Buckler, MD.  Electronically signed 11/23/2014 12:31:2                         8PM    The electronic signature indicates that the named Attending Pathologist has evaluated the specimen  Technical component performed at [facility], [address], [place], [place] [address] Lab: [phone] Dir: Darrick Penna. Evette Doffing, MD  Professional component performed at [facility], [facility], [place], [place], [place] [address] Lab: [phone] Dir: Dellia Nims. Rubinas, MD      No results found for: LABCA2 No results found for: CA199 Lab Results  Component Value Date   CEA 2.6 11/08/2014   No results found for: PSA No results found for: CA125   STUDIES: Ct Biopsy  11/29/2014   CLINICAL DATA:  liver lesion  EXAM: CT-GUIDED BIOPSY OF A RIGHT LOBE LIVER LESION.  CORE.  MEDICATIONS AND MEDICAL HISTORY: Versed two mg, Fentanyl 50 mcg.  Additional  Medications: None.  ANESTHESIA/SEDATION: Moderate sedation time: 10 minutes  PROCEDURE: The procedure, risks, benefits, and alternatives were explained to the patient. Questions regarding the procedure were encouraged and answered. The patient understands and consents to the procedure.  The right upper quadrant was prepped with Betadine in a sterile fashion, and a sterile drape was applied covering the operative field. A sterile gown and sterile gloves were used for the procedure.  Under CT guidance, a(n) 17 gauge guide needle was advanced into the right lobe liver lesion. Subsequently 3 18 gauge core biopsies were obtained. Gel-Foam slurry was injected into the tract. The guide needle was removed. Final imaging was performed.  Patient tolerated the procedure well without complication. Vital sign monitoring by nursing staff during the procedure will continue as patient is in the special procedures unit for post procedure observation.  FINDINGS: The images document guide needle placement within the right lobe liver lesion. Post biopsy images demonstrate no hemorrhage.  COMPLICATIONS: None  IMPRESSION: Successful CT-guided core  biopsy of a right lobe liver lesion.   Electronically Signed   By: Marybelle Killings M.D.   On: 11/29/2014 12:59    ASSESSMENT: 54 year old gentleman with stage IV carcinoma of prostate Refuses   any further androgen deprivation therapy with Lupron or any other injection    MEDICAL DECISION MAKING:  I had a long discussion with patient and family wife was present.  Our nurse navigator was present. Patient absolutely refuses port placement any chemotherapy or any other form of injection Possibility of no further therapy and palliative care option versus possibility of trying ZYTIGA had been discussed. Patient will make up his mind and will let us know Total duration of visit was 45 minutes.  50% or more time was spent in counseling patient and family regarding prognosis and options of  treatment and available resources  Patient expressed understanding and was in agreement with this plan. He also understands that He can call clinic at any time with any questions, concerns, or complaints.    Cancer of prostate   Staging form: Prostate, AJCC 7th Edition     Clinical: Stage IV (T1c, N0, M1, PSA: Less than 10, Gleason 8-10 - Poorly differentiated/undifferentiated (marked anaplasia)) - Signed by Forest Gleason, MD on 12/09/2014   Forest Gleason, MD   12/24/2014 11:25 AM

## 2015-01-17 ENCOUNTER — Inpatient Hospital Stay: Payer: Medicare Other | Attending: Oncology | Admitting: Oncology

## 2015-01-17 ENCOUNTER — Other Ambulatory Visit: Payer: Medicare Other

## 2015-01-17 ENCOUNTER — Inpatient Hospital Stay: Payer: Medicare Other

## 2015-01-17 ENCOUNTER — Ambulatory Visit: Payer: Medicare Other | Admitting: Oncology

## 2015-01-17 VITALS — BP 148/97 | HR 84 | Temp 97.1°F | Wt 163.1 lb

## 2015-01-17 DIAGNOSIS — Z79899 Other long term (current) drug therapy: Secondary | ICD-10-CM | POA: Diagnosis not present

## 2015-01-17 DIAGNOSIS — Z7952 Long term (current) use of systemic steroids: Secondary | ICD-10-CM | POA: Diagnosis not present

## 2015-01-17 DIAGNOSIS — F17210 Nicotine dependence, cigarettes, uncomplicated: Secondary | ICD-10-CM | POA: Diagnosis not present

## 2015-01-17 DIAGNOSIS — C61 Malignant neoplasm of prostate: Secondary | ICD-10-CM | POA: Diagnosis not present

## 2015-01-17 DIAGNOSIS — Z8673 Personal history of transient ischemic attack (TIA), and cerebral infarction without residual deficits: Secondary | ICD-10-CM | POA: Insufficient documentation

## 2015-01-17 DIAGNOSIS — C787 Secondary malignant neoplasm of liver and intrahepatic bile duct: Secondary | ICD-10-CM | POA: Diagnosis not present

## 2015-01-17 LAB — COMPREHENSIVE METABOLIC PANEL
ALK PHOS: 124 U/L (ref 38–126)
ALT: 22 U/L (ref 17–63)
AST: 22 U/L (ref 15–41)
Albumin: 4 g/dL (ref 3.5–5.0)
Anion gap: 6 (ref 5–15)
BILIRUBIN TOTAL: 0.4 mg/dL (ref 0.3–1.2)
BUN: 10 mg/dL (ref 6–20)
CHLORIDE: 103 mmol/L (ref 101–111)
CO2: 27 mmol/L (ref 22–32)
Calcium: 8.9 mg/dL (ref 8.9–10.3)
Creatinine, Ser: 0.65 mg/dL (ref 0.61–1.24)
GFR calc non Af Amer: >60 mL/min (ref >60)
GLUCOSE: 99 mg/dL (ref 65–99)
POTASSIUM: 3.3 mmol/L — AB (ref 3.5–5.1)
Sodium: 136 mmol/L (ref 135–145)
Total Protein: 7.2 g/dL (ref 6.5–8.1)

## 2015-01-17 LAB — PSA: PSA: 0.03 ng/mL (ref 0.00–4.00)

## 2015-01-17 LAB — CBC WITH DIFFERENTIAL/PLATELET
Basophils Absolute: 0.1 10*3/uL (ref 0–0.1)
Basophils Relative: 1 %
EOS ABS: 0.1 10*3/uL (ref 0–0.7)
EOS PCT: 1 %
HCT: 43.4 % (ref 40.0–52.0)
Hemoglobin: 14.7 g/dL (ref 13.0–18.0)
LYMPHS ABS: 2.3 10*3/uL (ref 1.0–3.6)
LYMPHS PCT: 27 %
MCH: 31.2 pg (ref 26.0–34.0)
MCHC: 34 g/dL (ref 32.0–36.0)
MCV: 91.7 fL (ref 80.0–100.0)
Monocytes Absolute: 0.8 10*3/uL (ref 0.2–1.0)
Monocytes Relative: 9 %
Neutro Abs: 5.1 10*3/uL (ref 1.4–6.5)
Neutrophils Relative %: 62 %
Platelets: 241 10*3/uL (ref 150–440)
RBC: 4.73 MIL/uL (ref 4.40–5.90)
RDW: 13.7 % (ref 11.5–14.5)
WBC: 8.3 10*3/uL (ref 3.8–10.6)

## 2015-01-17 MED ORDER — POTASSIUM CHLORIDE CRYS ER 20 MEQ PO TBCR: 20.0000 meq | EXTENDED_RELEASE_TABLET | Freq: Every day | ORAL | Status: DC

## 2015-01-17 NOTE — Progress Notes (Signed)
Patient does not have living will.  Currently smokes. 

## 2015-01-30 ENCOUNTER — Encounter: Payer: Self-pay | Admitting: Oncology

## 2015-01-30 NOTE — Progress Notes (Signed)
Cancer [facility] @ [facility] [phone]  [phone]     Roger Sparks. OB: 01/07/1961  MR#: [id number]  CSN#:[id number]  Patient Care Team: No Pcp Per Patient as PCP - General (General Practice) Kristi D Stanton, RN as Registered Nurse  CHIEF COMPLAINT:  Chief Complaint  Patient presents with  . Follow-up    Oncology History   Carcinoma prostate adenocarcinoma in multiple fragments.  Gleason grade 8.  Involving neutral space.  Large duct and S cylinder-type.  Poorly differentiated.  Lateral neck involvement.. PSAs low 2.4.  CT scan of abdomen was abnormal with multiple liver lesions.  Diagnosis in April of 2016 Clinical staging T1 cN0 M1 stage IV disease 2, CT-guided liver biopsies consistent with metastases from the prostate cancer   3.  Patient refused any chemotherapy or Lupron Started on ZYTIGA and prednisone from May of 2016     Cancer of prostate   12/09/2014 Initial Diagnosis Cancer of prostate    No flowsheet data found.  INTERVAL HISTORY:   53-year-old gentleman with a history of stage IV carcinoma of prostate refuses to get androgen deprivation therapy with Lupron.  Patient refuses port placement and unwilling to get Taxotere chemotherapy Here to discuss further planning of treatment.. January 17, 2015 Patient is here for further evaluation regarding stage IV carcinoma prostate.  Taking ZYTIGA and tolerating very well.  In the past patient has refused chemotherapy as well as Lupron therapy. No nausea no vomiting no diarrhea.  Patient is getting regular supply of ZYTIGA.  REVIEW OF SYSTEMS:   GENERAL:  Feels good.  Active.  No fevers, sweats or weight loss. PERFORMANCE STATUS (ECOG):01 HEENT:  No visual changes, runny nose, sore throat, mouth sores or tenderness. Lungs: No shortness of breath or cough.  No hemoptysis. Cardiac:  No chest pain, palpitations, orthopnea, or PND. GI:  No nausea, vomiting, diarrhea, constipation, melena or  hematochezia. GU:  No urgency, frequency, dysuria, or hematuria. Musculoskeletal:  No back pain.  No joint pain.  No muscle tenderness. Extremities:  No pain or swelling. Skin:  No rashes or skin changes. Neuro:  No headache, numbness or weakness, balance or coordination issues. Endocrine:  No diabetes, thyroid issues, hot flashes or night sweats. Psych:  No mood changes, depression or anxiety. Pain:  No focal pain. Review of systems:  All other systems reviewed and found to be negative.  As per HPI. Otherwise, a complete review of systems is negatve.  PAST MEDICAL HISTORY: Past Medical History  Diagnosis Date  . Stroke     at 54 years of age  . Neuromuscular disorder     brain tumor at 54 years of age  . Cancer   . Cancer of prostate 12/09/2014    PAST SURGICAL HISTORY: H/0 glioma and status post resection. FAMILY HISTORY There is no significant family history of breast cancer, ovarian cancer, colon cancer      ADVANCED DIRECTIVES: Does not have any living will refuse to consider at this point in time HEALTH MAINTENANCE: History  Substance Use Topics  . Smoking status: Current Every Day Smoker -- 0.50 packs/day    Types: Cigarettes  . Smokeless tobacco: Not on file  . Alcohol Use: No     Colonoscopy:  PAP:  Bone density:  Lipid panel:  No Known Allergies Current Outpatient Prescriptions  Medication Sig Dispense Refill  . potassium chloride SA (K-DUR,KLOR-CON) 20 MEQ tablet Take 1 tablet (20 mEq total) by mouth daily. 30 tablet 3  . predniSONE (  DELTASONE) 5 MG tablet     . ZYTIGA 250 MG tablet      No current facility-administered medications for this visit.    OBJECTIVE:  Filed Vitals:   01/17/15 1129  BP: 148/97  Pulse: 84  Temp: 97.1 F (36.2 C)     Body mass index is 25.55 kg/(m^2).    ECOG FS:0 - Asymptomatic  PHYSICAL EXAM Goal status: Performance status is good.  Patient has not lost significant weight HEENT: No evidence of stomatitis. Sclera  and conjunctivae :: No jaundice.   pale looking. Lungs: Air  entry equal on both sides.  No rhonchi.  No rales.  Cardiac: Heart sounds are normal.  No pericardial rub.  No murmur. Lymphatic system: Cervical, axillary, inguinal, lymph nodes not palpable GI: Abdomen is soft.  No ascites.  Liver spleen not palpable.  No tenderness.  Bowel sounds are within normal limit Lower extremity: No edema Neurological system: Higher functions, cranial nerves intact no evidence of peripheral neuropathy. Skin: No rash.  No ecchymosis.Marland Kitchen   LAB RESULTS:  Appointment on 01/17/2015  Component Date Value Ref Range Status  . WBC 01/17/2015 8.3  3.8 - 10.6 K/uL Final  . RBC 01/17/2015 4.73  4.40 - 5.90 MIL/uL Final  . Hemoglobin 01/17/2015 14.7  13.0 - 18.0 g/dL Final  . HCT 01/17/2015 43.4  40.0 - 52.0 % Final  . MCV 01/17/2015 91.7  80.0 - 100.0 fL Final  . MCH 01/17/2015 31.2  26.0 - 34.0 pg Final  . MCHC 01/17/2015 34.0  32.0 - 36.0 g/dL Final  . RDW 01/17/2015 13.7  11.5 - 14.5 % Final  . Platelets 01/17/2015 241  150 - 440 K/uL Final  . Neutrophils Relative % 01/17/2015 62   Final  . Neutro Abs 01/17/2015 5.1  1.4 - 6.5 K/uL Final  . Lymphocytes Relative 01/17/2015 27   Final  . Lymphs Abs 01/17/2015 2.3  1.0 - 3.6 K/uL Final  . Monocytes Relative 01/17/2015 9   Final  . Monocytes Absolute 01/17/2015 0.8  0.2 - 1.0 K/uL Final  . Eosinophils Relative 01/17/2015 1   Final  . Eosinophils Absolute 01/17/2015 0.1  0 - 0.7 K/uL Final  . Basophils Relative 01/17/2015 1   Final  . Basophils Absolute 01/17/2015 0.1  0 - 0.1 K/uL Final  . Sodium 01/17/2015 136  135 - 145 mmol/L Final  . Potassium 01/17/2015 3.3* 3.5 - 5.1 mmol/L Final  . Chloride 01/17/2015 103  101 - 111 mmol/L Final  . CO2 01/17/2015 27  22 - 32 mmol/L Final  . Glucose, Bld 01/17/2015 99  65 - 99 mg/dL Final  . BUN 01/17/2015 10  6 - 20 mg/dL Final  . Creatinine, Ser 01/17/2015 0.65  0.61 - 1.24 mg/dL Final  . Calcium 01/17/2015 8.9   8.9 - 10.3 mg/dL Final  . Total Protein 01/17/2015 7.2  6.5 - 8.1 g/dL Final  . Albumin 01/17/2015 4.0  3.5 - 5.0 g/dL Final  . AST 01/17/2015 22  15 - 41 U/L Final  . ALT 01/17/2015 22  17 - 63 U/L Final  . Alkaline Phosphatase 01/17/2015 124  38 - 126 U/L Final  . Total Bilirubin 01/17/2015 0.4  0.3 - 1.2 mg/dL Final  . GFR calc non Af Amer 01/17/2015 >60  >60 mL/min Final  . GFR calc Af Amer 01/17/2015 >60  >60 mL/min Final   Comment: (NOTE) The eGFR has been calculated using the CKD EPI equation. This calculation has not been  validated in all clinical situations. eGFR's persistently <60 mL/min signify possible Chronic Kidney Disease.   . Anion gap 01/17/2015 6  5 - 15 Final  . PSA 01/17/2015 0.03  0.00 - 4.00 ng/mL Final   Comment: (NOTE) While PSA levels of <=4.0 ng/ml are reported as reference range, some men with levels below 4.0 ng/ml can have prostate cancer and many men with PSA above 4.0 ng/ml do not have prostate cancer.  Other tests such as free PSA, age specific reference ranges, PSA velocity and PSA doubling time may be helpful especially in men less than 48 years old. Performed at [facility]     No results found for: LABCA2 No results found for: CA199 Lab Results  Component Value Date   CEA 2.6 11/08/2014    Lab Results  Component Value Date   PSA 0.03 01/17/2015     ASSESSMENT: 54 year old gentleman with stage IV carcinoma of prostate Patient is on ZYTIGA are tolerating treatment very well.  In the past patient has refused Lupron or Taxotere chemotherapy   MEDICAL DECISION MAKING:  All lab data has been reviewed.  Index 3 months patient will undergo repeat CT scan of abdomen to assess liver metastases  Patient expressed understanding and was in agreement with this plan. He also understands that He can call clinic at any time with any questions, concerns, or complaints.    Cancer of prostate   Staging form: Prostate, AJCC 7th Edition      Clinical: Stage IV (T1c, N0, M1, PSA: Less than 10, Gleason 8-10 - Poorly differentiated/undifferentiated (marked anaplasia)) - Signed by Forest Gleason, MD on 12/09/2014   Forest Gleason, MD   01/30/2015 8:42 AM

## 2015-02-13 ENCOUNTER — Other Ambulatory Visit: Payer: Self-pay | Admitting: *Deleted

## 2015-02-13 DIAGNOSIS — C61 Malignant neoplasm of prostate: Secondary | ICD-10-CM

## 2015-02-14 ENCOUNTER — Inpatient Hospital Stay: Payer: Medicare Other | Attending: Oncology | Admitting: Oncology

## 2015-02-14 ENCOUNTER — Inpatient Hospital Stay: Payer: Medicare Other

## 2015-02-14 ENCOUNTER — Encounter: Payer: Self-pay | Admitting: Oncology

## 2015-02-14 VITALS — BP 111/71 | HR 103 | Temp 97.8°F | Wt 161.2 lb

## 2015-02-14 DIAGNOSIS — R109 Unspecified abdominal pain: Secondary | ICD-10-CM | POA: Diagnosis not present

## 2015-02-14 DIAGNOSIS — Z8669 Personal history of other diseases of the nervous system and sense organs: Secondary | ICD-10-CM | POA: Diagnosis not present

## 2015-02-14 DIAGNOSIS — C787 Secondary malignant neoplasm of liver and intrahepatic bile duct: Secondary | ICD-10-CM | POA: Diagnosis not present

## 2015-02-14 DIAGNOSIS — Z79899 Other long term (current) drug therapy: Secondary | ICD-10-CM | POA: Insufficient documentation

## 2015-02-14 DIAGNOSIS — F17210 Nicotine dependence, cigarettes, uncomplicated: Secondary | ICD-10-CM | POA: Diagnosis not present

## 2015-02-14 DIAGNOSIS — C61 Malignant neoplasm of prostate: Secondary | ICD-10-CM

## 2015-02-14 DIAGNOSIS — Z8673 Personal history of transient ischemic attack (TIA), and cerebral infarction without residual deficits: Secondary | ICD-10-CM | POA: Insufficient documentation

## 2015-02-14 LAB — COMPREHENSIVE METABOLIC PANEL
ALBUMIN: 4.4 g/dL (ref 3.5–5.0)
ALT: 17 U/L (ref 17–63)
AST: 23 U/L (ref 15–41)
Alkaline Phosphatase: 136 U/L — ABNORMAL HIGH (ref 38–126)
Anion gap: 7 (ref 5–15)
BUN: 11 mg/dL (ref 6–20)
CALCIUM: 9.1 mg/dL (ref 8.9–10.3)
CO2: 26 mmol/L (ref 22–32)
Chloride: 104 mmol/L (ref 101–111)
Creatinine, Ser: 0.76 mg/dL (ref 0.61–1.24)
GFR calc Af Amer: >60 mL/min (ref >60)
GFR calc non Af Amer: >60 mL/min (ref >60)
Glucose, Bld: 105 mg/dL — ABNORMAL HIGH (ref 65–99)
POTASSIUM: 3.3 mmol/L — AB (ref 3.5–5.1)
SODIUM: 137 mmol/L (ref 135–145)
TOTAL PROTEIN: 7.3 g/dL (ref 6.5–8.1)
Total Bilirubin: 0.9 mg/dL (ref 0.3–1.2)

## 2015-02-14 LAB — CBC WITH DIFFERENTIAL/PLATELET
BASOS PCT: 1 %
Basophils Absolute: 0.1 10*3/uL (ref 0–0.1)
Eosinophils Absolute: 0.1 10*3/uL (ref 0–0.7)
Eosinophils Relative: 2 %
HEMATOCRIT: 45.3 % (ref 40.0–52.0)
HEMOGLOBIN: 15.4 g/dL (ref 13.0–18.0)
LYMPHS PCT: 30 %
Lymphs Abs: 2.6 10*3/uL (ref 1.0–3.6)
MCH: 31.4 pg (ref 26.0–34.0)
MCHC: 34.1 g/dL (ref 32.0–36.0)
MCV: 92 fL (ref 80.0–100.0)
MONOS PCT: 10 %
Monocytes Absolute: 0.8 10*3/uL (ref 0.2–1.0)
NEUTROS PCT: 57 %
Neutro Abs: 5 10*3/uL (ref 1.4–6.5)
PLATELETS: 234 10*3/uL (ref 150–440)
RBC: 4.92 MIL/uL (ref 4.40–5.90)
RDW: 13.6 % (ref 11.5–14.5)
WBC: 8.6 10*3/uL (ref 3.8–10.6)

## 2015-02-14 LAB — PSA: PSA: 0.02 ng/mL (ref 0.00–4.00)

## 2015-02-14 MED ORDER — OMEPRAZOLE 20 MG PO CPDR: 20.0000 mg | DELAYED_RELEASE_CAPSULE | Freq: Every day | ORAL | Status: DC

## 2015-02-14 MED ORDER — ALUM & MAG HYDROXIDE-SIMETH 200-200-20 MG/5ML PO SUSP: 15.0000 mL | Freq: Every day | ORAL | Status: DC

## 2015-02-14 NOTE — Progress Notes (Signed)
[place] @ [facility] [phone]) [phone]  Fax:(336) [place]. OB: 03/01/1961  MR#: [id number]  FOY#:[id number]  Patient Care Team: No Pcp Per Patient as PCP - General (General Practice) Clent Jacks, RN as Registered Nurse  CHIEF COMPLAINT:  Chief Complaint  Patient presents with  . Follow-up    Oncology History   Carcinoma prostate adenocarcinoma in multiple fragments.  Gleason grade 8.  Involving neutral space.  Large duct and S cylinder-type.  Poorly differentiated.  Lateral neck involvement.Marland Kitchen PSAs low 2.4.  CT scan of abdomen was abnormal with multiple liver lesions.  Diagnosis in April of 2016 Clinical staging T1 cN0 M1 stage IV disease 2, CT-guided liver biopsies consistent with metastases from the prostate cancer   3.  Patient refused any chemotherapy or Lupron Started on ZYTIGA and prednisone from May of 2016     Cancer of prostate   12/09/2014 Initial Diagnosis Cancer of prostate    No flowsheet data found.  INTERVAL HISTORY:   54 year old gentleman with a history of stage IV carcinoma of prostate refuses to get androgen deprivation therapy with Lupron.  Patient refuses port placement and unwilling to get Taxotere chemotherapy Here to discuss further planning of treatment.. January 17, 2015 Patient is here for further evaluation regarding stage IV carcinoma prostate.  Taking ZYTIGA and tolerating very well.  In the past patient has refused chemotherapy as well as Lupron therapy. No nausea no vomiting no diarrhea.  Patient is getting regular supply of ZYTIGA. February 14, 2015 Patient is here for ongoing evaluation regarding stage IV carcinoma prostate would refused any further androgen deprivation therapy.  And was started on Zytiga now patient complains of epigastric discomfort with prednisone and is stop taking prednisone.  Appetite has been stable.  Patient continues to smoke and refuses to quit smoking.  No nausea or vomiting.  REVIEW OF  SYSTEMS:   GENERAL:  Feels good.  Active.  No fevers, sweats or weight loss. PERFORMANCE STATUS (ECOG):01 HEENT:  No visual changes, runny nose, sore throat, mouth sores or tenderness. Lungs: No shortness of breath or cough.  No hemoptysis. Cardiac:  No chest pain, palpitations, orthopnea, or PND. GI:  No nausea, vomiting, diarrhea, constipation, melena or hematochezia. GU:  No urgency, frequency, dysuria, or hematuria. Musculoskeletal:  No back pain.  No joint pain.  No muscle tenderness. Extremities:  No pain or swelling. Skin:  No rashes or skin changes. Neuro:  No headache, numbness or weakness, balance or coordination issues. Endocrine:  No diabetes, thyroid issues, hot flashes or night sweats. Psych:  No mood changes, depression or anxiety. Pain:  No focal pain. Review of systems:  All other systems reviewed and found to be negative.  As per HPI. Otherwise, a complete review of systems is negatve.  PAST MEDICAL HISTORY: Past Medical History  Diagnosis Date  . Stroke     at 54 years of age  . Neuromuscular disorder     brain tumor at 54 years of age  . Cancer   . Cancer of prostate 12/09/2014    PAST SURGICAL HISTORY: H/0 glioma and status post resection. FAMILY HISTORY There is no significant family history of breast cancer, ovarian cancer, colon cancer      ADVANCED DIRECTIVES: Does not have any living will refuse to consider at this point in time HEALTH MAINTENANCE: History  Substance Use Topics  . Smoking status: Current Every Day Smoker -- 0.50 packs/day    Types: Cigarettes  .  Smokeless tobacco: Not on file  . Alcohol Use: No     Colonoscopy:  PAP:  Bone density:  Lipid panel:  No Known Allergies Current Outpatient Prescriptions  Medication Sig Dispense Refill  . potassium chloride SA (K-DUR,KLOR-CON) 20 MEQ tablet Take 1 tablet (20 mEq total) by mouth daily. 30 tablet 3  . ZYTIGA 250 MG tablet     . alum & mag hydroxide-simeth (MYLANTA) 200-200-20  MG/5ML suspension Take 15 mLs by mouth daily. Before prednisone. 355 mL 3  . omeprazole (PRILOSEC) 20 MG capsule Take 1 capsule (20 mg total) by mouth daily. 30 capsule 3  . predniSONE (DELTASONE) 5 MG tablet      No current facility-administered medications for this visit.    OBJECTIVE:  Filed Vitals:   02/14/15 1029  BP: 111/71  Pulse: 103  Temp: 97.8 F (36.6 C)     Body mass index is 25.23 kg/(m^2).    ECOG FS:0 - Asymptomatic  PHYSICAL EXAM Goal status: Performance status is good.  Patient has not lost significant weight HEENT: No evidence of stomatitis. Sclera and conjunctivae :: No jaundice.   pale looking. Lungs: Air  entry equal on both sides.  No rhonchi.  No rales.  Cardiac: Heart sounds are normal.  No pericardial rub.  No murmur. Lymphatic system: Cervical, axillary, inguinal, lymph nodes not palpable GI: Abdomen is soft.  No ascites.  Liver spleen not palpable.  No tenderness.  Bowel sounds are within normal limit Lower extremity: No edema Neurological system: Higher functions, cranial nerves intact no evidence of peripheral neuropathy. Skin: No rash.  No ecchymosis.Marland Kitchen   LAB RESULTS:  Appointment on 02/14/2015  Component Date Value Ref Range Status  . WBC 02/14/2015 8.6  3.8 - 10.6 K/uL Final  . RBC 02/14/2015 4.92  4.40 - 5.90 MIL/uL Final  . Hemoglobin 02/14/2015 15.4  13.0 - 18.0 g/dL Final  . HCT 02/14/2015 45.3  40.0 - 52.0 % Final  . MCV 02/14/2015 92.0  80.0 - 100.0 fL Final  . MCH 02/14/2015 31.4  26.0 - 34.0 pg Final  . MCHC 02/14/2015 34.1  32.0 - 36.0 g/dL Final  . RDW 02/14/2015 13.6  11.5 - 14.5 % Final  . Platelets 02/14/2015 234  150 - 440 K/uL Final  . Neutrophils Relative % 02/14/2015 57   Final  . Neutro Abs 02/14/2015 5.0  1.4 - 6.5 K/uL Final  . Lymphocytes Relative 02/14/2015 30   Final  . Lymphs Abs 02/14/2015 2.6  1.0 - 3.6 K/uL Final  . Monocytes Relative 02/14/2015 10   Final  . Monocytes Absolute 02/14/2015 0.8  0.2 - 1.0 K/uL Final   . Eosinophils Relative 02/14/2015 2   Final  . Eosinophils Absolute 02/14/2015 0.1  0 - 0.7 K/uL Final  . Basophils Relative 02/14/2015 1   Final  . Basophils Absolute 02/14/2015 0.1  0 - 0.1 K/uL Final  . Sodium 02/14/2015 137  135 - 145 mmol/L Final  . Potassium 02/14/2015 3.3* 3.5 - 5.1 mmol/L Final  . Chloride 02/14/2015 104  101 - 111 mmol/L Final  . CO2 02/14/2015 26  22 - 32 mmol/L Final  . Glucose, Bld 02/14/2015 105* 65 - 99 mg/dL Final  . BUN 02/14/2015 11  6 - 20 mg/dL Final  . Creatinine, Ser 02/14/2015 0.76  0.61 - 1.24 mg/dL Final  . Calcium 02/14/2015 9.1  8.9 - 10.3 mg/dL Final  . Total Protein 02/14/2015 7.3  6.5 - 8.1 g/dL Final  . Albumin  02/14/2015 4.4  3.5 - 5.0 g/dL Final  . AST 02/14/2015 23  15 - 41 U/L Final  . ALT 02/14/2015 17  17 - 63 U/L Final  . Alkaline Phosphatase 02/14/2015 136* 38 - 126 U/L Final  . Total Bilirubin 02/14/2015 0.9  0.3 - 1.2 mg/dL Final  . GFR calc non Af Amer 02/14/2015 >60  >60 mL/min Final  . GFR calc Af Amer 02/14/2015 >60  >60 mL/min Final   Comment: (NOTE) The eGFR has been calculated using the CKD EPI equation. This calculation has not been validated in all clinical situations. eGFR's persistently <60 mL/min signify possible Chronic Kidney Disease.   . Anion gap 02/14/2015 7  5 - 15 Final    No results found for: LABCA2 No results found for: CA199 Lab Results  Component Value Date   CEA 2.6 11/08/2014    Lab Results  Component Value Date   PSA 0.03 01/17/2015     ASSESSMENT: 54 year old gentleman with stage IV carcinoma of prostate Patient is on ZYTIGA are tolerating treatment very well.  In the past patient has refused Lupron or Taxotere chemotherapy Epigastric comfort either secondary to prednisone therapy or due to progressing cancer in the liver.  MEDICAL DECISION MAKING:  All lab data has been reviewed.  Index 3 months patient will undergo repeat CT scan of abdomen to assess liver metastases Detailed  discussion with patient that he has to take prednisone otherwise will have to stop ZYTIGA.  We prescribed Mylanta and Prilosec before prednisone Also will repeat CT scan prior to next appointment to see whether ZYTIGA is helping. Total duration of visit was 28mnutes.  50% or more time was spent in counseling patient and family regarding prognosis and options of treatment and available resources  Patient expressed understanding and was in agreement with this plan. He also understands that He can call clinic at any time with any questions, concerns, or complaints.    Cancer of prostate   Staging form: Prostate, AJCC 7th Edition     Clinical: Stage IV (T1c, N0, M1, PSA: Less than 10, Gleason 8-10 - Poorly differentiated/undifferentiated (marked anaplasia)) - Signed by JForest Gleason MD on 12/09/2014   JForest Gleason MD   02/14/2015 11:00 AM

## 2015-02-14 NOTE — Progress Notes (Signed)
Patient does not have living will.  Currently smokes. 

## 2015-03-12 ENCOUNTER — Ambulatory Visit: Payer: Medicare Other

## 2015-03-13 ENCOUNTER — Ambulatory Visit
Admission: RE | Admit: 2015-03-13 | Discharge: 2015-03-13 | Disposition: A | Discharge status: Home / Self Care | Payer: Medicare Other | Source: Ambulatory Visit | Attending: Oncology | Admitting: Oncology

## 2015-03-13 ENCOUNTER — Encounter
Admission: RE | Admit: 2015-03-13 | Discharge: 2015-03-13 | Disposition: A | Discharge status: Home / Self Care | Payer: Medicare Other | Source: Ambulatory Visit | Attending: Oncology | Admitting: Oncology

## 2015-03-13 ENCOUNTER — Ambulatory Visit: Payer: Medicare Other

## 2015-03-13 DIAGNOSIS — M25512 Pain in left shoulder: Secondary | ICD-10-CM | POA: Insufficient documentation

## 2015-03-13 DIAGNOSIS — M25511 Pain in right shoulder: Secondary | ICD-10-CM | POA: Insufficient documentation

## 2015-03-13 DIAGNOSIS — C787 Secondary malignant neoplasm of liver and intrahepatic bile duct: Secondary | ICD-10-CM | POA: Diagnosis present

## 2015-03-13 DIAGNOSIS — M25519 Pain in unspecified shoulder: Secondary | ICD-10-CM | POA: Diagnosis present

## 2015-03-13 DIAGNOSIS — C61 Malignant neoplasm of prostate: Secondary | ICD-10-CM

## 2015-03-13 DIAGNOSIS — K8020 Calculus of gallbladder without cholecystitis without obstruction: Secondary | ICD-10-CM | POA: Diagnosis not present

## 2015-03-13 MED ORDER — TECHNETIUM TC 99M MEDRONATE IV KIT
25.0000 | PACK | Freq: Once | INTRAVENOUS | Status: AC | PRN
  Administered 2015-03-13: 22.77 via INTRAVENOUS

## 2015-03-13 MED ORDER — IOHEXOL 300 MG/ML  SOLN
100.0000 mL | Freq: Once | INTRAMUSCULAR | Status: AC | PRN
  Administered 2015-03-13: 100 mL via INTRAVENOUS

## 2015-03-14 ENCOUNTER — Inpatient Hospital Stay: Payer: Medicare Other | Attending: Oncology | Admitting: Oncology

## 2015-03-14 ENCOUNTER — Inpatient Hospital Stay: Payer: Medicare Other

## 2015-03-14 VITALS — BP 148/82 | HR 86 | Temp 97.5°F | Wt 159.5 lb

## 2015-03-14 DIAGNOSIS — Z7952 Long term (current) use of systemic steroids: Secondary | ICD-10-CM | POA: Diagnosis not present

## 2015-03-14 DIAGNOSIS — K8020 Calculus of gallbladder without cholecystitis without obstruction: Secondary | ICD-10-CM | POA: Insufficient documentation

## 2015-03-14 DIAGNOSIS — Z79899 Other long term (current) drug therapy: Secondary | ICD-10-CM | POA: Insufficient documentation

## 2015-03-14 DIAGNOSIS — Z8673 Personal history of transient ischemic attack (TIA), and cerebral infarction without residual deficits: Secondary | ICD-10-CM | POA: Diagnosis not present

## 2015-03-14 DIAGNOSIS — Z8589 Personal history of malignant neoplasm of other organs and systems: Secondary | ICD-10-CM

## 2015-03-14 DIAGNOSIS — F17210 Nicotine dependence, cigarettes, uncomplicated: Secondary | ICD-10-CM

## 2015-03-14 DIAGNOSIS — E876 Hypokalemia: Secondary | ICD-10-CM | POA: Insufficient documentation

## 2015-03-14 DIAGNOSIS — C61 Malignant neoplasm of prostate: Secondary | ICD-10-CM | POA: Diagnosis not present

## 2015-03-14 DIAGNOSIS — C787 Secondary malignant neoplasm of liver and intrahepatic bile duct: Secondary | ICD-10-CM | POA: Insufficient documentation

## 2015-03-14 LAB — COMPREHENSIVE METABOLIC PANEL
ALT: 14 U/L — ABNORMAL LOW (ref 17–63)
AST: 19 U/L (ref 15–41)
Albumin: 4.1 g/dL (ref 3.5–5.0)
Alkaline Phosphatase: 116 U/L (ref 38–126)
Anion gap: 6 (ref 5–15)
BUN: 9 mg/dL (ref 6–20)
CHLORIDE: 102 mmol/L (ref 101–111)
CO2: 29 mmol/L (ref 22–32)
Calcium: 8.7 mg/dL — ABNORMAL LOW (ref 8.9–10.3)
Creatinine, Ser: 0.64 mg/dL (ref 0.61–1.24)
GFR calc Af Amer: >60 mL/min (ref >60)
GFR calc non Af Amer: >60 mL/min (ref >60)
Glucose, Bld: 98 mg/dL (ref 65–99)
Potassium: 2.8 mmol/L — CL (ref 3.5–5.1)
SODIUM: 137 mmol/L (ref 135–145)
Total Bilirubin: 0.4 mg/dL (ref 0.3–1.2)
Total Protein: 7.2 g/dL (ref 6.5–8.1)

## 2015-03-14 LAB — CBC WITH DIFFERENTIAL/PLATELET
BASOS PCT: 1 %
Basophils Absolute: 0.1 10*3/uL (ref 0–0.1)
EOS ABS: 0.1 10*3/uL (ref 0–0.7)
Eosinophils Relative: 1 %
HCT: 43.4 % (ref 40.0–52.0)
Hemoglobin: 15.2 g/dL (ref 13.0–18.0)
Lymphocytes Relative: 15 %
Lymphs Abs: 2 10*3/uL (ref 1.0–3.6)
MCH: 31.7 pg (ref 26.0–34.0)
MCHC: 34.9 g/dL (ref 32.0–36.0)
MCV: 90.6 fL (ref 80.0–100.0)
MONO ABS: 0.8 10*3/uL (ref 0.2–1.0)
Monocytes Relative: 6 %
Neutro Abs: 10.8 10*3/uL — ABNORMAL HIGH (ref 1.4–6.5)
Neutrophils Relative %: 77 %
PLATELETS: 232 10*3/uL (ref 150–440)
RBC: 4.79 MIL/uL (ref 4.40–5.90)
RDW: 13.3 % (ref 11.5–14.5)
WBC: 13.8 10*3/uL — AB (ref 3.8–10.6)

## 2015-03-14 MED ORDER — POTASSIUM CHLORIDE CRYS ER 20 MEQ PO TBCR: 20.0000 meq | EXTENDED_RELEASE_TABLET | Freq: Two times a day (BID) | ORAL | Status: DC

## 2015-03-14 NOTE — Progress Notes (Signed)
MD aware of critical potassium of 2.8. 03/14/15 @ 1145

## 2015-03-14 NOTE — Progress Notes (Signed)
Patient does not have living will.  Current every day smoker.  States he is aching all over.

## 2015-03-24 ENCOUNTER — Encounter: Payer: Self-pay | Admitting: Oncology

## 2015-03-24 NOTE — Progress Notes (Signed)
[place] @ [facility] [phone]) [phone]  Fax:(336) [place]. OB: 10-15-1960  MR#: [id number]  ENI#:[id number]  Patient Care Team: No Pcp Per Patient as PCP - General (General Practice) Clent Jacks, RN as Registered Nurse  CHIEF COMPLAINT:  Chief Complaint  Patient presents with  . Follow-up    Oncology History   Carcinoma prostate adenocarcinoma in multiple fragments.  Gleason grade 8.  Involving neutral space.  Large duct and S cylinder-type.  Poorly differentiated.  Lateral neck involvement.Marland Kitchen PSAs low 2.4.  CT scan of abdomen was abnormal with multiple liver lesions.  Diagnosis in April of 2016 Clinical staging T1 cN0 M1 stage IV disease 2, CT-guided liver biopsies consistent with metastases from the prostate cancer   3.  Patient refused any chemotherapy or Lupron Started on ZYTIGA and prednisone from May of 2016     Cancer of prostate   12/09/2014 Initial Diagnosis Cancer of prostate    No flowsheet data found.  INTERVAL HISTORY:   54 year old gentleman with a history of stage IV carcinoma of prostate refuses to get androgen deprivation therapy with Lupron.  Patient refuses port placement and unwilling to get Taxotere chemotherapy Here to discuss further planning of treatment.. January 17, 2015 Patient is here for further evaluation regarding stage IV carcinoma prostate.  Taking ZYTIGA and tolerating very well.  In the past patient has refused chemotherapy as well as Lupron therapy. No nausea no vomiting no diarrhea.  Patient is getting regular supply of ZYTIGA. February 14, 2015 Patient is here for ongoing evaluation regarding stage IV carcinoma prostate would refused any further androgen deprivation therapy.  And was started on Zytiga now patient complains of epigastric discomfort with prednisone and is stop taking prednisone.  Appetite has been stable.  Patient continues to smoke and refuses to quit smoking.  No nausea or vomiting. August of  2016  Patient is here for further follow-up and treatment consideration.  Taking ZYTIGA and prednisone.  Tolerating treatment very well.  Patient had a CT scan and bone scan done  tolerated treatment very well. No bony pains.  No difficulty passing urine  REVIEW OF SYSTEMS:   GENERAL:  Feels good.  Active.  No fevers, sweats or weight loss. PERFORMANCE STATUS (ECOG):01 HEENT:  No visual changes, runny nose, sore throat, mouth sores or tenderness. Lungs: No shortness of breath or cough.  No hemoptysis. Cardiac:  No chest pain, palpitations, orthopnea, or PND. GI:  No nausea, vomiting, diarrhea, constipation, melena or hematochezia. GU:  No urgency, frequency, dysuria, or hematuria. Musculoskeletal:  No back pain.  No joint pain.  No muscle tenderness. Extremities:  No pain or swelling. Skin:  No rashes or skin changes. Neuro:  No headache, numbness or weakness, balance or coordination issues. Endocrine:  No diabetes, thyroid issues, hot flashes or night sweats. Psych:  No mood changes, depression or anxiety. Pain:  No focal pain. Review of systems:  All other systems reviewed and found to be negative.  As per HPI. Otherwise, a complete review of systems is negatve.  PAST MEDICAL HISTORY: Past Medical History  Diagnosis Date  . Stroke     at 54 years of age  . Neuromuscular disorder     brain tumor at 54 years of age  . Cancer   . Cancer of prostate 12/09/2014    PAST SURGICAL HISTORY: H/0 glioma and status post resection. FAMILY HISTORY There is no significant family history of breast cancer, ovarian cancer, colon  cancer      ADVANCED DIRECTIVES: Does not have any living will refuse to consider at this point in time HEALTH MAINTENANCE: Social History  Substance Use Topics  . Smoking status: Current Every Day Smoker -- 0.50 packs/day    Types: Cigarettes  . Smokeless tobacco: None  . Alcohol Use: No     Colonoscopy:  PAP:  Bone density:  Lipid panel:  No Known  Allergies Current Outpatient Prescriptions  Medication Sig Dispense Refill  . alum & mag hydroxide-simeth (MYLANTA) 200-200-20 MG/5ML suspension Take 15 mLs by mouth daily. Before prednisone. 355 mL 3  . omeprazole (PRILOSEC) 20 MG capsule Take 1 capsule (20 mg total) by mouth daily. 30 capsule 3  . potassium chloride SA (K-DUR,KLOR-CON) 20 MEQ tablet Take 1 tablet (20 mEq total) by mouth 2 (two) times daily. 30 tablet 3  . predniSONE (DELTASONE) 5 MG tablet     . ZYTIGA 250 MG tablet      No current facility-administered medications for this visit.    OBJECTIVE:  Filed Vitals:   03/14/15 1053  BP: 148/82  Pulse: 86  Temp: 97.5 F (36.4 C)     Body mass index is 24.98 kg/(m^2).    ECOG FS:0 - Asymptomatic  PHYSICAL EXAM Goal status: Performance status is good.  Patient has not lost significant weight HEENT: No evidence of stomatitis. Sclera and conjunctivae :: No jaundice.   pale looking. Lungs: Air  entry equal on both sides.  No rhonchi.  No rales.  Cardiac: Heart sounds are normal.  No pericardial rub.  No murmur. Lymphatic system: Cervical, axillary, inguinal, lymph nodes not palpable GI: Abdomen is soft.  No ascites.  Liver spleen not palpable.  No tenderness.  Bowel sounds are within normal limit Lower extremity: No edema Neurological system: Higher functions, cranial nerves intact no evidence of peripheral neuropathy. Skin: No rash.  No ecchymosis.Marland Kitchen   LAB RESULTS:  Appointment on 03/14/2015  Component Date Value Ref Range Status  . WBC 03/14/2015 13.8* 3.8 - 10.6 K/uL Final  . RBC 03/14/2015 4.79  4.40 - 5.90 MIL/uL Final  . Hemoglobin 03/14/2015 15.2  13.0 - 18.0 g/dL Final  . HCT 03/14/2015 43.4  40.0 - 52.0 % Final  . MCV 03/14/2015 90.6  80.0 - 100.0 fL Final  . MCH 03/14/2015 31.7  26.0 - 34.0 pg Final  . MCHC 03/14/2015 34.9  32.0 - 36.0 g/dL Final  . RDW 03/14/2015 13.3  11.5 - 14.5 % Final  . Platelets 03/14/2015 232  150 - 440 K/uL Final  . Neutrophils  Relative % 03/14/2015 77   Final  . Neutro Abs 03/14/2015 10.8* 1.4 - 6.5 K/uL Final  . Lymphocytes Relative 03/14/2015 15   Final  . Lymphs Abs 03/14/2015 2.0  1.0 - 3.6 K/uL Final  . Monocytes Relative 03/14/2015 6   Final  . Monocytes Absolute 03/14/2015 0.8  0.2 - 1.0 K/uL Final  . Eosinophils Relative 03/14/2015 1   Final  . Eosinophils Absolute 03/14/2015 0.1  0 - 0.7 K/uL Final  . Basophils Relative 03/14/2015 1   Final  . Basophils Absolute 03/14/2015 0.1  0 - 0.1 K/uL Final  . Sodium 03/14/2015 137  135 - 145 mmol/L Final  . Potassium 03/14/2015 2.8* 3.5 - 5.1 mmol/L Final   Comment: RESULTS VERIFIED BY REPEAT TESTING CRITICAL RESULT CALLED TO, READ BACK BY AND VERIFIED WITH NYO TO HAYLEY RHODE 03/14/15 1107   . Chloride 03/14/2015 102  101 - 111 mmol/L  Final  . CO2 03/14/2015 29  22 - 32 mmol/L Final  . Glucose, Bld 03/14/2015 98  65 - 99 mg/dL Final  . BUN 03/14/2015 9  6 - 20 mg/dL Final  . Creatinine, Ser 03/14/2015 0.64  0.61 - 1.24 mg/dL Final  . Calcium 03/14/2015 8.7* 8.9 - 10.3 mg/dL Final  . Total Protein 03/14/2015 7.2  6.5 - 8.1 g/dL Final  . Albumin 03/14/2015 4.1  3.5 - 5.0 g/dL Final  . AST 03/14/2015 19  15 - 41 U/L Final  . ALT 03/14/2015 14* 17 - 63 U/L Final  . Alkaline Phosphatase 03/14/2015 116  38 - 126 U/L Final  . Total Bilirubin 03/14/2015 0.4  0.3 - 1.2 mg/dL Final  . GFR calc non Af Amer 03/14/2015 >60  >60 mL/min Final  . GFR calc Af Amer 03/14/2015 >60  >60 mL/min Final   Comment: (NOTE) The eGFR has been calculated using the CKD EPI equation. This calculation has not been validated in all clinical situations. eGFR's persistently <60 mL/min signify possible Chronic Kidney Disease.   . Anion gap 03/14/2015 6  5 - 15 Final    No results found for: LABCA2 No results found for: CA199 Lab Results  Component Value Date   CEA 2.6 11/08/2014    Lab Results  Component Value Date   PSA 0.02 02/14/2015   CT scan of abdomen on March 13, 2015 IMPRESSION: 1. Interval decrease in volume of hepatic metastasis. 2. No evidence of periaortic retroperitoneal adenopathy. 3. Cholelithiasis Bone scan (August, 2016) MPRESSION: 1. Increased activity previously noted of the left shoulder is again noted but is increased in prominence from prior exam. Although these changes are most likely degenerative, shoulder series suggested for further evaluation. 2. Subtle increased activity noted over the left ischium. Pelvic series suggested for further evaluation  ASSESSMENT: 54 year old gentleman with stage IV carcinoma of prostate Patient is on ZYTIGA are tolerating treatment very well.  In the past patient has refused Lupron or Taxotere chemotherapy Hypokalemia Patient did not want any intravenous potassium Finally convinced her to take by mouth potassium Problem associated including cardiac address has been explained to him with hypokalemia .  MEDICAL DECISION MAKING:   All lab data has been reviewed.  CT scan and bone scan has been reviewed independently.  There is no evidence of progressive disease.  I had a lot of explanation to do with patient's refusal to take prednisone.  Finally agreed to take a small dose with 4. Patient expressed understanding and was in agreement with this plan. He also understands that He can call clinic at any time with any questions, concerns, or complaints.    Cancer of prostate   Staging form: Prostate, AJCC 7th Edition     Clinical: Stage IV (T1c, N0, M1, PSA: Less than 10, Gleason 8-10 - Poorly differentiated/undifferentiated (marked anaplasia)) - Signed by Forest Gleason, MD on 12/09/2014   Forest Gleason, MD   03/24/2015 4:27 PM

## 2015-04-11 ENCOUNTER — Encounter: Payer: Self-pay | Admitting: Oncology

## 2015-04-11 ENCOUNTER — Ambulatory Visit: Payer: Medicare Other | Admitting: Oncology

## 2015-04-11 ENCOUNTER — Other Ambulatory Visit: Payer: Medicare Other

## 2015-04-11 ENCOUNTER — Inpatient Hospital Stay (HOSPITAL_BASED_OUTPATIENT_CLINIC_OR_DEPARTMENT_OTHER): Payer: Medicare Other | Admitting: Oncology

## 2015-04-11 ENCOUNTER — Inpatient Hospital Stay: Payer: Medicare Other | Attending: Oncology

## 2015-04-11 VITALS — BP 125/89 | HR 88 | Temp 98.4°F | Wt 159.8 lb

## 2015-04-11 DIAGNOSIS — E876 Hypokalemia: Secondary | ICD-10-CM

## 2015-04-11 DIAGNOSIS — Z79899 Other long term (current) drug therapy: Secondary | ICD-10-CM

## 2015-04-11 DIAGNOSIS — Z85841 Personal history of malignant neoplasm of brain: Secondary | ICD-10-CM | POA: Diagnosis not present

## 2015-04-11 DIAGNOSIS — C787 Secondary malignant neoplasm of liver and intrahepatic bile duct: Secondary | ICD-10-CM

## 2015-04-11 DIAGNOSIS — C61 Malignant neoplasm of prostate: Secondary | ICD-10-CM | POA: Insufficient documentation

## 2015-04-11 DIAGNOSIS — Z7952 Long term (current) use of systemic steroids: Secondary | ICD-10-CM | POA: Diagnosis not present

## 2015-04-11 DIAGNOSIS — F17210 Nicotine dependence, cigarettes, uncomplicated: Secondary | ICD-10-CM

## 2015-04-11 DIAGNOSIS — Z8673 Personal history of transient ischemic attack (TIA), and cerebral infarction without residual deficits: Secondary | ICD-10-CM | POA: Diagnosis not present

## 2015-04-11 LAB — COMPREHENSIVE METABOLIC PANEL
ALBUMIN: 4.1 g/dL (ref 3.5–5.0)
ALT: 12 U/L — ABNORMAL LOW (ref 17–63)
ANION GAP: 7 (ref 5–15)
AST: 20 U/L (ref 15–41)
Alkaline Phosphatase: 127 U/L — ABNORMAL HIGH (ref 38–126)
BUN: 11 mg/dL (ref 6–20)
CHLORIDE: 104 mmol/L (ref 101–111)
CO2: 28 mmol/L (ref 22–32)
Calcium: 9.6 mg/dL (ref 8.9–10.3)
Creatinine, Ser: 0.66 mg/dL (ref 0.61–1.24)
GFR calc Af Amer: >60 mL/min (ref >60)
GFR calc non Af Amer: >60 mL/min (ref >60)
GLUCOSE: 80 mg/dL (ref 65–99)
POTASSIUM: 3.1 mmol/L — AB (ref 3.5–5.1)
SODIUM: 139 mmol/L (ref 135–145)
Total Bilirubin: 0.7 mg/dL (ref 0.3–1.2)
Total Protein: 7.2 g/dL (ref 6.5–8.1)

## 2015-04-11 LAB — PSA: PSA: 0.01 ng/mL (ref 0.00–4.00)

## 2015-04-11 LAB — CBC WITH DIFFERENTIAL/PLATELET
BASOS PCT: 0 %
Basophils Absolute: 0 10*3/uL (ref 0–0.1)
EOS ABS: 0.1 10*3/uL (ref 0–0.7)
Eosinophils Relative: 1 %
HCT: 45.2 % (ref 40.0–52.0)
HEMOGLOBIN: 15.3 g/dL (ref 13.0–18.0)
Lymphocytes Relative: 28 %
Lymphs Abs: 2.3 10*3/uL (ref 1.0–3.6)
MCH: 31.7 pg (ref 26.0–34.0)
MCHC: 33.9 g/dL (ref 32.0–36.0)
MCV: 93.5 fL (ref 80.0–100.0)
Monocytes Absolute: 0.9 10*3/uL (ref 0.2–1.0)
Monocytes Relative: 10 %
NEUTROS PCT: 61 %
Neutro Abs: 5.1 10*3/uL (ref 1.4–6.5)
Platelets: 213 10*3/uL (ref 150–440)
RBC: 4.83 MIL/uL (ref 4.40–5.90)
RDW: 13.5 % (ref 11.5–14.5)
WBC: 8.4 10*3/uL (ref 3.8–10.6)

## 2015-04-11 MED ORDER — POTASSIUM CHLORIDE CRYS ER 20 MEQ PO TBCR: 20.0000 meq | EXTENDED_RELEASE_TABLET | Freq: Two times a day (BID) | ORAL | Status: DC

## 2015-04-11 NOTE — Progress Notes (Signed)
[place] @ [facility] Telephone:(336) [phone]  Fax:(336) [place]. OB: [birth year]  MR#: [id number]  YQI#:[id number]  Patient Care Team: No Pcp Per Patient as PCP - General (General Practice) Clent Jacks, RN as Registered Nurse  CHIEF COMPLAINT:  No chief complaint on file.   Oncology History   Carcinoma prostate adenocarcinoma in multiple fragments.  Gleason grade 8.  Involving neutral space.  Large duct and S cylinder-type.  Poorly differentiated.  Lateral neck involvement.Marland Kitchen PSAs low 2.4.  CT scan of abdomen was abnormal with multiple liver lesions.  Diagnosis in April of 2016 Clinical staging T1 cN0 M1 stage IV disease 2, CT-guided liver biopsies consistent with metastases from the prostate cancer   3.  Patient refused any chemotherapy or Lupron Started on ZYTIGA and prednisone from May of 2016     Cancer of prostate   12/09/2014 Initial Diagnosis Cancer of prostate    No flowsheet data found.  INTERVAL HISTORY:   54 year old gentleman with a history of stage IV carcinoma of prostate refuses to get androgen deprivation therapy with Lupron.  Patient refuses port placement and unwilling to get Taxotere chemotherapy Here to discuss further planning of treatment.. January 17, 2015 Patient is here for further evaluation regarding stage IV carcinoma prostate.  Taking ZYTIGA and tolerating very well.  In the past patient has refused chemotherapy as well as Lupron therapy. No nausea no vomiting no diarrhea.  Patient is getting regular supply of ZYTIGA. February 14, 2015 Patient is here for ongoing evaluation regarding stage IV carcinoma prostate would refused any further androgen deprivation therapy.  And was started on Zytiga now patient complains of epigastric discomfort with prednisone and is stop taking prednisone.  Appetite has been stable.  Patient continues to smoke and refuses to quit smoking.  No nausea or vomiting. August of 2016  Patient is here for  further follow-up and treatment consideration.  Taking ZYTIGA and prednisone.  Tolerating treatment very well.  Patient had a CT scan and bone scan done  tolerated treatment very well. No bony pains.  No difficulty passing urine  REVIEW OF SYSTEMS:   GENERAL:  Feels good.  Active.  No fevers, sweats or weight loss. PERFORMANCE STATUS (ECOG):01 HEENT:  No visual changes, runny nose, sore throat, mouth sores or tenderness. Lungs: No shortness of breath or cough.  No hemoptysis. Cardiac:  No chest pain, palpitations, orthopnea, or PND. GI:  No nausea, vomiting, diarrhea, constipation, melena or hematochezia. GU:  No urgency, frequency, dysuria, or hematuria. Musculoskeletal:  No back pain.  No joint pain.  No muscle tenderness. Extremities:  No pain or swelling. Skin:  No rashes or skin changes. Neuro:  No headache, numbness or weakness, balance or coordination issues. Endocrine:  No diabetes, thyroid issues, hot flashes or night sweats. Psych:  No mood changes, depression or anxiety. Pain:  No focal pain. Review of systems:  All other systems reviewed and found to be negative.  As per HPI. Otherwise, a complete review of systems is negatve.  PAST MEDICAL HISTORY: Past Medical History  Diagnosis Date  . Stroke     at 54 years of age  . Neuromuscular disorder     brain tumor at 54 years of age  . Cancer   . Cancer of prostate 12/09/2014    PAST SURGICAL HISTORY: H/0 glioma and status post resection. FAMILY HISTORY There is no significant family history of breast cancer, ovarian cancer, colon cancer  ADVANCED DIRECTIVES: Does not have any living will refuse to consider at this point in time HEALTH MAINTENANCE: Social History  Substance Use Topics  . Smoking status: Current Every Day Smoker -- 0.50 packs/day    Types: Cigarettes  . Smokeless tobacco: None  . Alcohol Use: No     Colonoscopy:  PAP:  Bone density:  Lipid panel:  No Known Allergies Current Outpatient  Prescriptions  Medication Sig Dispense Refill  . alum & mag hydroxide-simeth (MYLANTA) 200-200-20 MG/5ML suspension Take 15 mLs by mouth daily. Before prednisone. 355 mL 3  . omeprazole (PRILOSEC) 20 MG capsule Take 1 capsule (20 mg total) by mouth daily. 30 capsule 3  . potassium chloride SA (K-DUR,KLOR-CON) 20 MEQ tablet Take 1 tablet (20 mEq total) by mouth 2 (two) times daily. 30 tablet 3  . predniSONE (DELTASONE) 5 MG tablet     . ZYTIGA 250 MG tablet      No current facility-administered medications for this visit.    OBJECTIVE:  Filed Vitals:   04/11/15 1047  BP: 125/89  Pulse: 88  Temp: 98.4 F (36.9 C)     Body mass index is 25.03 kg/(m^2).    ECOG FS:0 - Asymptomatic  PHYSICAL EXAM Goal status: Performance status is good.  Patient has not lost significant weight HEENT: No evidence of stomatitis. Sclera and conjunctivae :: No jaundice.   pale looking. Lungs: Air  entry equal on both sides.  No rhonchi.  No rales.  Cardiac: Heart sounds are normal.  No pericardial rub.  No murmur. Lymphatic system: Cervical, axillary, inguinal, lymph nodes not palpable GI: Abdomen is soft.  No ascites.  Liver spleen not palpable.  No tenderness.  Bowel sounds are within normal limit Lower extremity: No edema Neurological system: Higher functions, cranial nerves intact no evidence of peripheral neuropathy. Skin: No rash.  No ecchymosis.Marland Kitchen   LAB RESULTS:  Appointment on 04/11/2015  Component Date Value Ref Range Status  . WBC 04/11/2015 8.4  3.8 - 10.6 K/uL Final  . RBC 04/11/2015 4.83  4.40 - 5.90 MIL/uL Final  . Hemoglobin 04/11/2015 15.3  13.0 - 18.0 g/dL Final  . HCT 04/11/2015 45.2  40.0 - 52.0 % Final  . MCV 04/11/2015 93.5  80.0 - 100.0 fL Final  . MCH 04/11/2015 31.7  26.0 - 34.0 pg Final  . MCHC 04/11/2015 33.9  32.0 - 36.0 g/dL Final  . RDW 04/11/2015 13.5  11.5 - 14.5 % Final  . Platelets 04/11/2015 213  150 - 440 K/uL Final  . Neutrophils Relative % 04/11/2015 61   Final    . Neutro Abs 04/11/2015 5.1  1.4 - 6.5 K/uL Final  . Lymphocytes Relative 04/11/2015 28   Final  . Lymphs Abs 04/11/2015 2.3  1.0 - 3.6 K/uL Final  . Monocytes Relative 04/11/2015 10   Final  . Monocytes Absolute 04/11/2015 0.9  0.2 - 1.0 K/uL Final  . Eosinophils Relative 04/11/2015 1   Final  . Eosinophils Absolute 04/11/2015 0.1  0 - 0.7 K/uL Final  . Basophils Relative 04/11/2015 0   Final  . Basophils Absolute 04/11/2015 0.0  0 - 0.1 K/uL Final    No results found for: LABCA2 No results found for: CA199 Lab Results  Component Value Date   CEA 2.6 11/08/2014    Lab Results  Component Value Date   PSA 0.02 02/14/2015   CT scan of abdomen on March 13, 2015 IMPRESSION: 1. Interval decrease in volume of hepatic metastasis. 2. No  evidence of periaortic retroperitoneal adenopathy. 3. Cholelithiasis Bone scan (August, 2016) MPRESSION: 1. Increased activity previously noted of the left shoulder is again noted but is increased in prominence from prior exam. Although these changes are most likely degenerative, shoulder series suggested for further evaluation. 2. Subtle increased activity noted over the left ischium. Pelvic series suggested for further evaluation  ASSESSMENT: 54 year old gentleman with stage IV carcinoma of prostate Patient is on ZYTIGA are tolerating treatment very well.  In the past patient has refused Lupron or Taxotere chemotherapy Hypokalemia. Potassium level is improved to 3.1 continue potassium supplement Patient did not want any intravenous potassium Finally convinced her to take by mouth potassium Problem associated including cardiac address has been explained to him with hypokalemia .  MEDICAL DECISION MAKING:   All lab data has been reviewed.  CT scan and bone scan has been reviewed independently.  There is no evidence of progressive disease.  I had a lot of explanation to do with patient's refusal to take prednisone.  Finally agreed to take a  small dose with 4. Patient expressed understanding and was in agreement with this plan. He also understands that He can call clinic at any time with any questions, concerns, or complaints.    Cancer of prostate   Staging form: Prostate, AJCC 7th Edition     Clinical: Stage IV (T1c, N0, M1, PSA: Less than 10, Gleason 8-10 - Poorly differentiated/undifferentiated (marked anaplasia)) - Signed by Forest Gleason, MD on 12/09/2014   Forest Gleason, MD   04/11/2015 11:12 AM

## 2015-05-09 ENCOUNTER — Ambulatory Visit: Payer: Medicare Other | Admitting: Oncology

## 2015-05-09 ENCOUNTER — Inpatient Hospital Stay: Payer: Medicare Other

## 2015-05-09 ENCOUNTER — Inpatient Hospital Stay: Payer: Medicare Other | Admitting: Oncology

## 2015-05-09 ENCOUNTER — Other Ambulatory Visit: Payer: Medicare Other

## 2015-05-24 ENCOUNTER — Encounter: Payer: Self-pay | Admitting: Oncology

## 2015-05-24 ENCOUNTER — Inpatient Hospital Stay: Payer: Medicare Other | Attending: Oncology

## 2015-05-24 ENCOUNTER — Inpatient Hospital Stay (HOSPITAL_BASED_OUTPATIENT_CLINIC_OR_DEPARTMENT_OTHER): Payer: Medicare Other | Admitting: Oncology

## 2015-05-24 VITALS — BP 131/86 | HR 76 | Temp 97.7°F | Ht 67.0 in | Wt 163.1 lb

## 2015-05-24 DIAGNOSIS — Z79899 Other long term (current) drug therapy: Secondary | ICD-10-CM

## 2015-05-24 DIAGNOSIS — C61 Malignant neoplasm of prostate: Secondary | ICD-10-CM | POA: Diagnosis not present

## 2015-05-24 DIAGNOSIS — C787 Secondary malignant neoplasm of liver and intrahepatic bile duct: Secondary | ICD-10-CM | POA: Diagnosis not present

## 2015-05-24 DIAGNOSIS — E876 Hypokalemia: Secondary | ICD-10-CM | POA: Diagnosis not present

## 2015-05-24 DIAGNOSIS — Z8673 Personal history of transient ischemic attack (TIA), and cerebral infarction without residual deficits: Secondary | ICD-10-CM | POA: Diagnosis not present

## 2015-05-24 DIAGNOSIS — F17210 Nicotine dependence, cigarettes, uncomplicated: Secondary | ICD-10-CM

## 2015-05-24 LAB — CBC WITH DIFFERENTIAL/PLATELET
BASOS ABS: 0.1 10*3/uL (ref 0–0.1)
Basophils Relative: 1 %
Eosinophils Absolute: 0.1 10*3/uL (ref 0–0.7)
Eosinophils Relative: 1 %
HEMATOCRIT: 43.4 % (ref 40.0–52.0)
Hemoglobin: 15 g/dL (ref 13.0–18.0)
LYMPHS PCT: 23 %
Lymphs Abs: 2.2 10*3/uL (ref 1.0–3.6)
MCH: 31.5 pg (ref 26.0–34.0)
MCHC: 34.5 g/dL (ref 32.0–36.0)
MCV: 91.4 fL (ref 80.0–100.0)
Monocytes Absolute: 0.8 10*3/uL (ref 0.2–1.0)
Monocytes Relative: 8 %
NEUTROS ABS: 6.5 10*3/uL (ref 1.4–6.5)
Neutrophils Relative %: 67 %
PLATELETS: 277 10*3/uL (ref 150–440)
RBC: 4.75 MIL/uL (ref 4.40–5.90)
RDW: 13.4 % (ref 11.5–14.5)
WBC: 9.7 10*3/uL (ref 3.8–10.6)

## 2015-05-24 LAB — COMPREHENSIVE METABOLIC PANEL
ALT: 12 U/L — ABNORMAL LOW (ref 17–63)
ANION GAP: 7 (ref 5–15)
AST: 18 U/L (ref 15–41)
Albumin: 4.2 g/dL (ref 3.5–5.0)
Alkaline Phosphatase: 130 U/L — ABNORMAL HIGH (ref 38–126)
BILIRUBIN TOTAL: 0.4 mg/dL (ref 0.3–1.2)
BUN: 9 mg/dL (ref 6–20)
CO2: 27 mmol/L (ref 22–32)
Calcium: 8.5 mg/dL — ABNORMAL LOW (ref 8.9–10.3)
Chloride: 101 mmol/L (ref 101–111)
Creatinine, Ser: 0.62 mg/dL (ref 0.61–1.24)
Glucose, Bld: 99 mg/dL (ref 65–99)
Potassium: 2.8 mmol/L — CL (ref 3.5–5.1)
Sodium: 135 mmol/L (ref 135–145)
Total Protein: 7.3 g/dL (ref 6.5–8.1)

## 2015-05-24 LAB — PSA: PSA: 0.02 ng/mL (ref 0.00–4.00)

## 2015-05-24 MED ORDER — POTASSIUM CHLORIDE 20 MEQ/15ML (10%) PO SOLN: 20.0000 meq | Freq: Two times a day (BID) | ORAL | Status: DC

## 2015-05-24 MED ORDER — POTASSIUM CHLORIDE 20 MEQ PO PACK: 20.0000 meq | PACK | Freq: Two times a day (BID) | ORAL | Status: DC

## 2015-05-24 NOTE — Progress Notes (Signed)
Unable to verify current medication, patient did not know names of medication.

## 2015-05-26 ENCOUNTER — Encounter: Payer: Self-pay | Admitting: Oncology

## 2015-05-26 NOTE — Progress Notes (Signed)
[place] @ [facility] [phone]) [phone]  Fax:(336) [place]. OB: [birth year]  MR#: [id number]  TWS#:[id number]  Patient Care Team: No Pcp Per Patient as PCP - General (General Practice) Clent Jacks, RN as Registered Nurse  CHIEF COMPLAINT:  Chief Complaint  Patient presents with  . Prostate Cancer    Oncology History   Carcinoma prostate adenocarcinoma in multiple fragments.  Gleason grade 8.  Involving neutral space.  Large duct and S cylinder-type.  Poorly differentiated.  Lateral neck involvement.Marland Kitchen PSAs low 2.4.  CT scan of abdomen was abnormal with multiple liver lesions.  Diagnosis in April of 2016 Clinical staging T1 cN0 M1 stage IV disease 2, CT-guided liver biopsies consistent with metastases from the prostate cancer   3.  Patient refused any chemotherapy or Lupron Started on ZYTIGA and prednisone from May of 2016     Cancer of prostate [facility])   12/09/2014 Initial Diagnosis Cancer of prostate     INTERVAL HISTORY:   54 year old gentleman with a history of stage IV carcinoma of prostate refuses to get androgen deprivation therapy with Lupron.  Patient refuses port placement and unwilling to get Taxotere chemotherapy Here to discuss further planning of treatment.. Patient is here for further follow-up regarding carcinoma prostate.  Patient is getting ZYTIGA with taking prednisone only once a day. No chills.  No fever.  No nausea.  No vomiting.  No diarrhea. Patient refuses to take oral potassium because his is difficulty swallowing does not want intravenous potassium.  He remains hypokalemic.  REVIEW OF SYSTEMS:   GENERAL:  Feels good.  Active.  No fevers, sweats or weight loss. PERFORMANCE STATUS (ECOG):01 HEENT:  No visual changes, runny nose, sore throat, mouth sores or tenderness. Lungs: No shortness of breath or cough.  No hemoptysis. Cardiac:  No chest pain, palpitations, orthopnea, or PND. GI:  No nausea, vomiting, diarrhea,  constipation, melena or hematochezia. GU:  No urgency, frequency, dysuria, or hematuria. Musculoskeletal:  No back pain.  No joint pain.  No muscle tenderness. Extremities:  No pain or swelling. Skin:  No rashes or skin changes. Neuro:  No headache, numbness or weakness, balance or coordination issues. Endocrine:  No diabetes, thyroid issues, hot flashes or night sweats. Psych:  No mood changes, depression or anxiety. Pain:  No focal pain. Review of systems:  All other systems reviewed and found to be negative.  As per HPI. Otherwise, a complete review of systems is negatve.  PAST MEDICAL HISTORY: Past Medical History  Diagnosis Date  . Stroke [facility])     at 54 years of age  . Neuromuscular disorder ([place])     brain tumor at 54 years of age  . Cancer ([place])   . Cancer of prostate ([place]) 12/09/2014    PAST SURGICAL HISTORY: H/0 glioma and status post resection. FAMILY HISTORY There is no significant family history of breast cancer, ovarian cancer, colon cancer      ADVANCED DIRECTIVES: Does not have any living will refuse to consider at this point in time HEALTH MAINTENANCE: Social History  Substance Use Topics  . Smoking status: Current Every Day Smoker -- 0.50 packs/day    Types: Cigarettes  . Smokeless tobacco: None  . Alcohol Use: No     Colonoscopy:  PAP:  Bone density:  Lipid panel:  No Known Allergies Current Outpatient Prescriptions  Medication Sig Dispense Refill  . omeprazole (PRILOSEC) 20 MG capsule Take 1 capsule (20 mg total) by mouth daily.  30 capsule 3  . potassium chloride (KLOR-CON) 20 MEQ packet Take 20 mEq by mouth 2 (two) times daily. 60 packet 3  . predniSONE (DELTASONE) 5 MG tablet     . ZYTIGA 250 MG tablet      No current facility-administered medications for this visit.    OBJECTIVE:  Filed Vitals:   05/24/15 1211  BP: 131/86  Pulse: 76  Temp: 97.7 F (36.5 C)     Body mass index is 25.55 kg/(m^2).    ECOG FS:0 -  Asymptomatic  PHYSICAL EXAM Goal status: Performance status is good.  Patient has not lost significant weight HEENT: No evidence of stomatitis. Sclera and conjunctivae :: No jaundice.   pale looking. Lungs: Air  entry equal on both sides.  No rhonchi.  No rales.  Cardiac: Heart sounds are normal.  No pericardial rub.  No murmur. Lymphatic system: Cervical, axillary, inguinal, lymph nodes not palpable GI: Abdomen is soft.  No ascites.  Liver spleen not palpable.  No tenderness.  Bowel sounds are within normal limit Lower extremity: No edema Neurological system: Higher functions, cranial nerves intact no evidence of peripheral neuropathy. Skin: No rash.  No ecchymosis.Marland Kitchen   LAB RESULTS:  Appointment on 05/24/2015  Component Date Value Ref Range Status  . WBC 05/24/2015 9.7  3.8 - 10.6 K/uL Final  . RBC 05/24/2015 4.75  4.40 - 5.90 MIL/uL Final  . Hemoglobin 05/24/2015 15.0  13.0 - 18.0 g/dL Final  . HCT 05/24/2015 43.4  40.0 - 52.0 % Final  . MCV 05/24/2015 91.4  80.0 - 100.0 fL Final  . MCH 05/24/2015 31.5  26.0 - 34.0 pg Final  . MCHC 05/24/2015 34.5  32.0 - 36.0 g/dL Final  . RDW 05/24/2015 13.4  11.5 - 14.5 % Final  . Platelets 05/24/2015 277  150 - 440 K/uL Final  . Neutrophils Relative % 05/24/2015 67   Final  . Neutro Abs 05/24/2015 6.5  1.4 - 6.5 K/uL Final  . Lymphocytes Relative 05/24/2015 23   Final  . Lymphs Abs 05/24/2015 2.2  1.0 - 3.6 K/uL Final  . Monocytes Relative 05/24/2015 8   Final  . Monocytes Absolute 05/24/2015 0.8  0.2 - 1.0 K/uL Final  . Eosinophils Relative 05/24/2015 1   Final  . Eosinophils Absolute 05/24/2015 0.1  0 - 0.7 K/uL Final  . Basophils Relative 05/24/2015 1   Final  . Basophils Absolute 05/24/2015 0.1  0 - 0.1 K/uL Final  . Sodium 05/24/2015 135  135 - 145 mmol/L Final  . Potassium 05/24/2015 2.8* 3.5 - 5.1 mmol/L Final   Comment: RESULT REPEATED AND VERIFIED CRITICAL RESULT CALLED TO, READ BACK BY AND VERIFIED WITH: NYO TO HAYLEY RHODE [id number]  05/24/15   . Chloride 05/24/2015 101  101 - 111 mmol/L Final  . CO2 05/24/2015 27  22 - 32 mmol/L Final  . Glucose, Bld 05/24/2015 99  65 - 99 mg/dL Final  . BUN 05/24/2015 9  6 - 20 mg/dL Final  . Creatinine, Ser 05/24/2015 0.62  0.61 - 1.24 mg/dL Final  . Calcium 05/24/2015 8.5* 8.9 - 10.3 mg/dL Final  . Total Protein 05/24/2015 7.3  6.5 - 8.1 g/dL Final  . Albumin 05/24/2015 4.2  3.5 - 5.0 g/dL Final  . AST 05/24/2015 18  15 - 41 U/L Final  . ALT 05/24/2015 12* 17 - 63 U/L Final  . Alkaline Phosphatase 05/24/2015 130* 38 - 126 U/L Final  . Total Bilirubin 05/24/2015 0.4  0.3 -  1.2 mg/dL Final  . GFR calc non Af Amer 05/24/2015 >60  >60 mL/min Final  . GFR calc Af Amer 05/24/2015 >60  >60 mL/min Final   Comment: (NOTE) The eGFR has been calculated using the CKD EPI equation. This calculation has not been validated in all clinical situations. eGFR's persistently <60 mL/min signify possible Chronic Kidney Disease.   . Anion gap 05/24/2015 7  5 - 15 Final  . PSA 05/24/2015 0.02  0.00 - 4.00 ng/mL Final   Comment: (NOTE) While PSA levels of <=4.0 ng/ml are reported as reference range, some men with levels below 4.0 ng/ml can have prostate cancer and many men with PSA above 4.0 ng/ml do not have prostate cancer.  Other tests such as free PSA, age specific reference ranges, PSA velocity and PSA doubling time may be helpful especially in men less than 24 years old. Performed at [facility]     No results found for: LABCA2 No results found for: CA199 Lab Results  Component Value Date   CEA 2.6 11/08/2014    Lab Results  Component Value Date   PSA 0.02 05/24/2015   CT scan of abdomen on March 13, 2015 IMPRESSION: 1. Interval decrease in volume of hepatic metastasis. 2. No evidence of periaortic retroperitoneal adenopathy. 3. Cholelithiasis Bone scan (August, 2016) MPRESSION: 1. Increased activity previously noted of the left shoulder is again noted but is  increased in prominence from prior exam. Although these changes are most likely degenerative, shoulder series suggested for further evaluation. 2. Subtle increased activity noted over the left ischium. Pelvic series suggested for further evaluation  ASSESSMENT: 54 year old gentleman with stage IV carcinoma of prostate Patient is on ZYTIGA are tolerating treatment very well.  In the past patient has refused Lupron or Taxotere chemotherapy Hypokalemia. Patient refuses intravenous potassium.  Has difficulty swallowing potassium by mouth.  So potassium powder or liquid potassium has been recommended Continue ZYTIGA Last CT scan was in August so will be repeated in February along with a bone scan .  MEDICAL DECISION MAKING:   Lab data has been reviewed  PSAs 0.02 Cancer of prostate   Staging form: Prostate, AJCC 7th Edition     Clinical: Stage IV (T1c, N0, M1, PSA: Less than 10, Gleason 8-10 - Poorly differentiated/undifferentiated (marked anaplasia)) - Signed by Forest Gleason, MD on 12/09/2014   Forest Gleason, MD   05/26/2015 7:57 AM

## 2015-07-05 ENCOUNTER — Encounter: Payer: Self-pay | Admitting: Oncology

## 2015-07-05 ENCOUNTER — Inpatient Hospital Stay (HOSPITAL_BASED_OUTPATIENT_CLINIC_OR_DEPARTMENT_OTHER): Payer: Medicare Other | Admitting: Oncology

## 2015-07-05 ENCOUNTER — Inpatient Hospital Stay: Payer: Medicare Other | Attending: Oncology

## 2015-07-05 VITALS — BP 127/86 | HR 85 | Temp 97.2°F | Wt 157.4 lb

## 2015-07-05 DIAGNOSIS — M899 Disorder of bone, unspecified: Secondary | ICD-10-CM

## 2015-07-05 DIAGNOSIS — Z79899 Other long term (current) drug therapy: Secondary | ICD-10-CM

## 2015-07-05 DIAGNOSIS — Z85841 Personal history of malignant neoplasm of brain: Secondary | ICD-10-CM

## 2015-07-05 DIAGNOSIS — C61 Malignant neoplasm of prostate: Secondary | ICD-10-CM | POA: Diagnosis not present

## 2015-07-05 DIAGNOSIS — Z8739 Personal history of other diseases of the musculoskeletal system and connective tissue: Secondary | ICD-10-CM

## 2015-07-05 DIAGNOSIS — K8020 Calculus of gallbladder without cholecystitis without obstruction: Secondary | ICD-10-CM | POA: Diagnosis not present

## 2015-07-05 DIAGNOSIS — Z7952 Long term (current) use of systemic steroids: Secondary | ICD-10-CM | POA: Diagnosis not present

## 2015-07-05 DIAGNOSIS — E876 Hypokalemia: Secondary | ICD-10-CM | POA: Insufficient documentation

## 2015-07-05 DIAGNOSIS — F17210 Nicotine dependence, cigarettes, uncomplicated: Secondary | ICD-10-CM | POA: Insufficient documentation

## 2015-07-05 DIAGNOSIS — C787 Secondary malignant neoplasm of liver and intrahepatic bile duct: Secondary | ICD-10-CM | POA: Insufficient documentation

## 2015-07-05 LAB — CBC WITH DIFFERENTIAL/PLATELET
BASOS PCT: 1 %
Basophils Absolute: 0.1 10*3/uL (ref 0–0.1)
Eosinophils Absolute: 0.1 10*3/uL (ref 0–0.7)
Eosinophils Relative: 1 %
HEMATOCRIT: 46.1 % (ref 40.0–52.0)
Hemoglobin: 15.8 g/dL (ref 13.0–18.0)
LYMPHS PCT: 26 %
Lymphs Abs: 2.8 10*3/uL (ref 1.0–3.6)
MCH: 31.7 pg (ref 26.0–34.0)
MCHC: 34.2 g/dL (ref 32.0–36.0)
MCV: 92.6 fL (ref 80.0–100.0)
MONO ABS: 0.9 10*3/uL (ref 0.2–1.0)
MONOS PCT: 9 %
NEUTROS ABS: 6.9 10*3/uL — AB (ref 1.4–6.5)
NEUTROS PCT: 63 %
Platelets: 218 10*3/uL (ref 150–440)
RBC: 4.98 MIL/uL (ref 4.40–5.90)
RDW: 13.8 % (ref 11.5–14.5)
WBC: 10.8 10*3/uL — ABNORMAL HIGH (ref 3.8–10.6)

## 2015-07-05 LAB — COMPREHENSIVE METABOLIC PANEL
ALBUMIN: 4.3 g/dL (ref 3.5–5.0)
ALK PHOS: 119 U/L (ref 38–126)
ALT: 13 U/L — ABNORMAL LOW (ref 17–63)
ANION GAP: 8 (ref 5–15)
AST: 16 U/L (ref 15–41)
BILIRUBIN TOTAL: 0.6 mg/dL (ref 0.3–1.2)
BUN: 13 mg/dL (ref 6–20)
CALCIUM: 9.4 mg/dL (ref 8.9–10.3)
CO2: 24 mmol/L (ref 22–32)
Chloride: 103 mmol/L (ref 101–111)
Creatinine, Ser: 0.57 mg/dL — ABNORMAL LOW (ref 0.61–1.24)
GFR calc Af Amer: >60 mL/min (ref >60)
GLUCOSE: 102 mg/dL — AB (ref 65–99)
Potassium: 3.4 mmol/L — ABNORMAL LOW (ref 3.5–5.1)
Sodium: 135 mmol/L (ref 135–145)
TOTAL PROTEIN: 7.3 g/dL (ref 6.5–8.1)

## 2015-07-05 LAB — PSA: PSA: 0.05 ng/mL (ref 0.00–4.00)

## 2015-07-05 NOTE — Progress Notes (Signed)
Patient states he is having joint pain in his hands.  Also complains of night sweats.

## 2015-07-06 ENCOUNTER — Encounter: Payer: Self-pay | Admitting: Oncology

## 2015-07-06 NOTE — Progress Notes (Signed)
[place] @ [facility] [phone]  Fax:([phone]) [place]. OB: Jun 08, 1961  MR#: [id number]  FAO#:[id number]  Patient Care Team: No Pcp Per Patient as PCP - General (General Practice) Clent Jacks, RN as Registered Nurse  CHIEF COMPLAINT:  Chief Complaint  Patient presents with  . Prostate Cancer    Oncology History   Carcinoma prostate adenocarcinoma in multiple fragments.  Gleason grade 8.  Involving neutral space.  Large duct and S cylinder-type.  Poorly differentiated.  Lateral neck involvement.Marland Kitchen PSAs low 2.4.  CT scan of abdomen was abnormal with multiple liver lesions.  Diagnosis in April of 2016 Clinical staging T1 cN0 M1 stage IV disease 2, CT-guided liver biopsies consistent with metastases from the prostate cancer   3.  Patient refused any chemotherapy or Lupron Started on ZYTIGA and prednisone from May of 2016     Cancer of prostate [facility])   12/09/2014 Initial Diagnosis Cancer of prostate     INTERVAL HISTORY:   54 year old gentleman with a history of stage IV carcinoma of prostate refuses to get androgen deprivation therapy with Lupron.  Patient refuses port placement and unwilling to get Taxotere chemotherapy Here to discuss further planning of treatment.. Patient is taking ZYTIGA not taking prednisone as prescribed.  Patient does not want any Lupron injection.  On Xgeva injection.  Here for stage IV carcinoma prostate Denies any aches and pains.  No nausea no vomiting no diarrhea appetite remains stable  REVIEW OF SYSTEMS:   GENERAL:  Feels good.  Active.  No fevers, sweats or weight loss. PERFORMANCE STATUS (ECOG):01 HEENT:  No visual changes, runny nose, sore throat, mouth sores or tenderness. Lungs: No shortness of breath or cough.  No hemoptysis. Cardiac:  No chest pain, palpitations, orthopnea, or PND. GI:  No nausea, vomiting, diarrhea, constipation, melena or hematochezia. GU:  No urgency, frequency, dysuria, or  hematuria. Musculoskeletal:  No back pain.  No joint pain.  No muscle tenderness. Extremities:  No pain or swelling. Skin:  No rashes or skin changes. Neuro:  No headache, numbness or weakness, balance or coordination issues. Endocrine:  No diabetes, thyroid issues, hot flashes or night sweats. Psych:  No mood changes, depression or anxiety. Pain:  No focal pain. Review of systems:  All other systems reviewed and found to be negative.  As per HPI. Otherwise, a complete review of systems is negatve.  PAST MEDICAL HISTORY: Past Medical History  Diagnosis Date  . Stroke [facility])     at 54 years of age  . Neuromuscular disorder ([place])     brain tumor at 54 years of age  . Cancer ([place])   . Cancer of prostate ([place]) 12/09/2014    PAST SURGICAL HISTORY: H/0 glioma and status post resection. FAMILY HISTORY There is no significant family history of breast cancer, ovarian cancer, colon cancer      ADVANCED DIRECTIVES: Does not have any living will refuse to consider at this point in time HEALTH MAINTENANCE: Social History  Substance Use Topics  . Smoking status: Current Every Day Smoker -- 0.50 packs/day    Types: Cigarettes  . Smokeless tobacco: None  . Alcohol Use: No     Colonoscopy:  PAP:  Bone density:  Lipid panel:  No Known Allergies Current Outpatient Prescriptions  Medication Sig Dispense Refill  . omeprazole (PRILOSEC) 20 MG capsule Take 1 capsule (20 mg total) by mouth daily. 30 capsule 3  . potassium chloride (KLOR-CON) 20 MEQ packet Take  20 mEq by mouth 2 (two) times daily. 60 packet 3  . predniSONE (DELTASONE) 5 MG tablet     . ZYTIGA 250 MG tablet      No current facility-administered medications for this visit.    OBJECTIVE:  Filed Vitals:   07/05/15 1120  BP: 127/86  Pulse: 85  Temp: 97.2 F (36.2 C)     Body mass index is 24.65 kg/(m^2).    ECOG FS:0 - Asymptomatic  PHYSICAL EXAM Goal status: Performance status is good.  Patient has not lost  significant weight HEENT: No evidence of stomatitis. Sclera and conjunctivae :: No jaundice.   pale looking. Lungs: Air  entry equal on both sides.  No rhonchi.  No rales.  Cardiac: Heart sounds are normal.  No pericardial rub.  No murmur. Lymphatic system: Cervical, axillary, inguinal, lymph nodes not palpable GI: Abdomen is soft.  No ascites.  Liver spleen not palpable.  No tenderness.  Bowel sounds are within normal limit Lower extremity: No edema Neurological system: Higher functions, cranial nerves intact no evidence of peripheral neuropathy. Skin: No rash.  No ecchymosis.Marland Kitchen   LAB RESULTS:  Appointment on 07/05/2015  Component Date Value Ref Range Status  . WBC 07/05/2015 10.8* 3.8 - 10.6 K/uL Final  . RBC 07/05/2015 4.98  4.40 - 5.90 MIL/uL Final  . Hemoglobin 07/05/2015 15.8  13.0 - 18.0 g/dL Final  . HCT 07/05/2015 46.1  40.0 - 52.0 % Final  . MCV 07/05/2015 92.6  80.0 - 100.0 fL Final  . MCH 07/05/2015 31.7  26.0 - 34.0 pg Final  . MCHC 07/05/2015 34.2  32.0 - 36.0 g/dL Final  . RDW 07/05/2015 13.8  11.5 - 14.5 % Final  . Platelets 07/05/2015 218  150 - 440 K/uL Final  . Neutrophils Relative % 07/05/2015 63   Final  . Neutro Abs 07/05/2015 6.9* 1.4 - 6.5 K/uL Final  . Lymphocytes Relative 07/05/2015 26   Final  . Lymphs Abs 07/05/2015 2.8  1.0 - 3.6 K/uL Final  . Monocytes Relative 07/05/2015 9   Final  . Monocytes Absolute 07/05/2015 0.9  0.2 - 1.0 K/uL Final  . Eosinophils Relative 07/05/2015 1   Final  . Eosinophils Absolute 07/05/2015 0.1  0 - 0.7 K/uL Final  . Basophils Relative 07/05/2015 1   Final  . Basophils Absolute 07/05/2015 0.1  0 - 0.1 K/uL Final  . Sodium 07/05/2015 135  135 - 145 mmol/L Final  . Potassium 07/05/2015 3.4* 3.5 - 5.1 mmol/L Final  . Chloride 07/05/2015 103  101 - 111 mmol/L Final  . CO2 07/05/2015 24  22 - 32 mmol/L Final  . Glucose, Bld 07/05/2015 102* 65 - 99 mg/dL Final  . BUN 07/05/2015 13  6 - 20 mg/dL Final  . Creatinine, Ser  07/05/2015 0.57* 0.61 - 1.24 mg/dL Final  . Calcium 07/05/2015 9.4  8.9 - 10.3 mg/dL Final  . Total Protein 07/05/2015 7.3  6.5 - 8.1 g/dL Final  . Albumin 07/05/2015 4.3  3.5 - 5.0 g/dL Final  . AST 07/05/2015 16  15 - 41 U/L Final  . ALT 07/05/2015 13* 17 - 63 U/L Final  . Alkaline Phosphatase 07/05/2015 119  38 - 126 U/L Final  . Total Bilirubin 07/05/2015 0.6  0.3 - 1.2 mg/dL Final  . GFR calc non Af Amer 07/05/2015 >60  >60 mL/min Final  . GFR calc Af Amer 07/05/2015 >60  >60 mL/min Final   Comment: (NOTE) The eGFR has been calculated using  the CKD EPI equation. This calculation has not been validated in all clinical situations. eGFR's persistently <60 mL/min signify possible Chronic Kidney Disease.   . Anion gap 07/05/2015 8  5 - 15 Final  . PSA 07/05/2015 0.05  0.00 - 4.00 ng/mL Final   Comment: (NOTE) While PSA levels of <=4.0 ng/ml are reported as reference range, some men with levels below 4.0 ng/ml can have prostate cancer and many men with PSA above 4.0 ng/ml do not have prostate cancer.  Other tests such as free PSA, age specific reference ranges, PSA velocity and PSA doubling time may be helpful especially in men less than 70 years old. Performed at [facility]     No results found for: LABCA2 No results found for: CA199 Lab Results  Component Value Date   CEA 2.6 11/08/2014    Lab Results  Component Value Date   PSA 0.05 07/05/2015   CT scan of abdomen on March 13, 2015 IMPRESSION: 1. Interval decrease in volume of hepatic metastasis. 2. No evidence of periaortic retroperitoneal adenopathy. 3. Cholelithiasis Bone scan (August, 2016) MPRESSION: 1. Increased activity previously noted of the left shoulder is again noted but is increased in prominence from prior exam. Although these changes are most likely degenerative, shoulder series suggested for further evaluation. 2. Subtle increased activity noted over the left ischium. Pelvic series  suggested for further evaluation  ASSESSMENT: 54 year old gentleman with stage IV carcinoma of prostate Patient is on ZYTIGA are tolerating treatment very well.  In the past patient has refused Lupron or Taxotere chemotherapy Hypokalemia. Patient refuses intravenous potassium.  Has difficulty swallowing potassium by mouth.  So potassium powder or liquid potassium has been recommended All lab data has been reviewed.  Potassium still low but improved than before.  Will continue oral potassium.  Patient denies any bony pain.  Does not want any flu shot already Lupron injection or any other chemotherapy .  MEDICAL DECISION MAKING:   Lab data has been reviewed  PSAs 0.02 Cancer of prostate   Staging form: Prostate, AJCC 7th Edition     Clinical: Stage IV (T1c, N0, M1, PSA: Less than 10, Gleason 8-10 - Poorly differentiated/undifferentiated (marked anaplasia)) - Signed by Forest Gleason, MD on 12/09/2014   Forest Gleason, MD   07/06/2015 2:41 PM

## 2015-08-02 ENCOUNTER — Inpatient Hospital Stay: Payer: Medicare Other | Attending: Oncology

## 2015-08-02 ENCOUNTER — Encounter: Payer: Self-pay | Admitting: Oncology

## 2015-08-02 ENCOUNTER — Inpatient Hospital Stay (HOSPITAL_BASED_OUTPATIENT_CLINIC_OR_DEPARTMENT_OTHER): Payer: Medicare Other | Admitting: Oncology

## 2015-08-02 VITALS — BP 142/98 | HR 79 | Temp 98.0°F | Resp 18 | Wt 160.2 lb

## 2015-08-02 DIAGNOSIS — C61 Malignant neoplasm of prostate: Secondary | ICD-10-CM | POA: Diagnosis not present

## 2015-08-02 DIAGNOSIS — E876 Hypokalemia: Secondary | ICD-10-CM | POA: Insufficient documentation

## 2015-08-02 DIAGNOSIS — C787 Secondary malignant neoplasm of liver and intrahepatic bile duct: Secondary | ICD-10-CM | POA: Diagnosis not present

## 2015-08-02 DIAGNOSIS — K8020 Calculus of gallbladder without cholecystitis without obstruction: Secondary | ICD-10-CM | POA: Diagnosis not present

## 2015-08-02 DIAGNOSIS — Z79899 Other long term (current) drug therapy: Secondary | ICD-10-CM

## 2015-08-02 DIAGNOSIS — Z8673 Personal history of transient ischemic attack (TIA), and cerebral infarction without residual deficits: Secondary | ICD-10-CM

## 2015-08-02 DIAGNOSIS — C7951 Secondary malignant neoplasm of bone: Secondary | ICD-10-CM

## 2015-08-02 DIAGNOSIS — Z7982 Long term (current) use of aspirin: Secondary | ICD-10-CM | POA: Insufficient documentation

## 2015-08-02 DIAGNOSIS — Z85841 Personal history of malignant neoplasm of brain: Secondary | ICD-10-CM

## 2015-08-02 LAB — CBC WITH DIFFERENTIAL/PLATELET
BASOS PCT: 1 %
Basophils Absolute: 0.1 10*3/uL (ref 0–0.1)
EOS ABS: 0.1 10*3/uL (ref 0–0.7)
EOS PCT: 1 %
HCT: 43.9 % (ref 40.0–52.0)
Hemoglobin: 15.2 g/dL (ref 13.0–18.0)
LYMPHS ABS: 2.6 10*3/uL (ref 1.0–3.6)
Lymphocytes Relative: 25 %
MCH: 31.8 pg (ref 26.0–34.0)
MCHC: 34.5 g/dL (ref 32.0–36.0)
MCV: 92.2 fL (ref 80.0–100.0)
MONOS PCT: 9 %
Monocytes Absolute: 0.9 10*3/uL (ref 0.2–1.0)
NEUTROS PCT: 64 %
Neutro Abs: 6.8 10*3/uL — ABNORMAL HIGH (ref 1.4–6.5)
PLATELETS: 233 10*3/uL (ref 150–440)
RBC: 4.76 MIL/uL (ref 4.40–5.90)
RDW: 13.5 % (ref 11.5–14.5)
WBC: 10.6 10*3/uL (ref 3.8–10.6)

## 2015-08-02 LAB — COMPREHENSIVE METABOLIC PANEL
ALBUMIN: 4.1 g/dL (ref 3.5–5.0)
ALT: 14 U/L — ABNORMAL LOW (ref 17–63)
ANION GAP: 6 (ref 5–15)
AST: 16 U/L (ref 15–41)
Alkaline Phosphatase: 130 U/L — ABNORMAL HIGH (ref 38–126)
BUN: 12 mg/dL (ref 6–20)
CALCIUM: 9.3 mg/dL (ref 8.9–10.3)
CHLORIDE: 103 mmol/L (ref 101–111)
CO2: 27 mmol/L (ref 22–32)
Creatinine, Ser: 0.61 mg/dL (ref 0.61–1.24)
GFR calc non Af Amer: >60 mL/min (ref >60)
GLUCOSE: 104 mg/dL — AB (ref 65–99)
POTASSIUM: 3.8 mmol/L (ref 3.5–5.1)
SODIUM: 136 mmol/L (ref 135–145)
Total Bilirubin: 0.5 mg/dL (ref 0.3–1.2)
Total Protein: 7.2 g/dL (ref 6.5–8.1)

## 2015-08-02 LAB — PSA: PSA: 0.07 ng/mL (ref 0.00–4.00)

## 2015-08-02 MED ORDER — POTASSIUM CHLORIDE 20 MEQ PO PACK: 20.0000 meq | PACK | Freq: Two times a day (BID) | ORAL | Status: DC

## 2015-08-02 NOTE — Progress Notes (Signed)
Patient states he is getting a cold. Otherwise, offers no complaints.

## 2015-08-03 ENCOUNTER — Encounter: Payer: Self-pay | Admitting: Oncology

## 2015-08-03 NOTE — Progress Notes (Signed)
[place] @ [facility] [phone]  Fax:(336) [place]. OB: January 18, 1961  MR#: [id number]  GGE#:[id number]  Patient Care Team: No Pcp Per Patient as PCP - General (General Practice) Clent Jacks, RN as Registered Nurse  CHIEF COMPLAINT:  Chief Complaint  Patient presents with  . Prostate Cancer    Oncology History   Carcinoma prostate adenocarcinoma in multiple fragments.  Gleason grade 8.  Involving neutral space.  Large duct and S cylinder-type.  Poorly differentiated.  Lateral neck involvement.Marland Kitchen PSAs low 2.4.  CT scan of abdomen was abnormal with multiple liver lesions.  Diagnosis in April of 2016 Clinical staging T1 cN0 M1 stage IV disease 2, CT-guided liver biopsies consistent with metastases from the prostate cancer   3.  Patient refused any chemotherapy or Lupron Started on ZYTIGA and prednisone from May of 2016     Cancer of prostate [facility])   12/09/2014 Initial Diagnosis Cancer of prostate     INTERVAL HISTORY:   55 year old gentleman with a history of stage IV carcinoma of prostate refuses to get androgen deprivation therapy with Lupron.  Patient refuses port placement and unwilling to get Taxotere chemotherapy Here to discuss further planning of treatment.. Patient is taking ZYTIGA not taking prednisone as prescribed.  Patient does not want any Lupron injection.  On Xgeva injection.  Here for stage IV carcinoma prostate Denies any aches and pains.  No nausea no vomiting no diarrhea appetite remains stable  REVIEW OF SYSTEMS:   GENERAL:  Feels good.  Active.  No fevers, sweats or weight loss. PERFORMANCE STATUS (ECOG):01 HEENT:  No visual changes, runny nose, sore throat, mouth sores or tenderness. Lungs: No shortness of breath or cough.  No hemoptysis. Cardiac:  No chest pain, palpitations, orthopnea, or PND. GI:  No nausea, vomiting, diarrhea, constipation, melena or hematochezia. GU:  No urgency, frequency, dysuria, or  hematuria. Musculoskeletal:  No back pain.  No joint pain.  No muscle tenderness. Extremities:  No pain or swelling. Skin:  No rashes or skin changes. Neuro:  No headache, numbness or weakness, balance or coordination issues. Endocrine:  No diabetes, thyroid issues, hot flashes or night sweats. Psych:  No mood changes, depression or anxiety. Pain:  No focal pain. Review of systems:  All other systems reviewed and found to be negative.  As per HPI. Otherwise, a complete review of systems is negatve.  PAST MEDICAL HISTORY: Past Medical History  Diagnosis Date  . Stroke [facility])     at 55 years of age  . Neuromuscular disorder ([place])     brain tumor at 56 years of age  . Cancer ([place])   . Cancer of prostate ([place]) 12/09/2014    PAST SURGICAL HISTORY: H/0 glioma and status post resection. FAMILY HISTORY There is no significant family history of breast cancer, ovarian cancer, colon cancer      ADVANCED DIRECTIVES: Does not have any living will refuse to consider at this point in time HEALTH MAINTENANCE   No Known Allergies Current Outpatient Prescriptions  Medication Sig Dispense Refill  . omeprazole (PRILOSEC) 20 MG capsule Take 1 capsule (20 mg total) by mouth daily. 30 capsule 3  . predniSONE (DELTASONE) 5 MG tablet Take 5 mg by mouth daily with breakfast.     . ZYTIGA 250 MG tablet Take 1,000 mg by mouth daily.     . potassium chloride (KLOR-CON) 20 MEQ packet Take 20 mEq by mouth 2 (two) times daily. 60 packet 3  No current facility-administered medications for this visit.    OBJECTIVE:  Filed Vitals:   08/02/15 1145  BP: 142/98  Pulse: 79  Temp: 98 F (36.7 C)  Resp: 18     Body mass index is 25.09 kg/(m^2).    ECOG FS:0 - Asymptomatic  PHYSICAL EXAM Goal status: Performance status is good.  Patient has not lost significant weight HEENT: No evidence of stomatitis. Sclera and conjunctivae :: No jaundice.   pale looking. Lungs: Air  entry equal on both sides.  No  rhonchi.  No rales.  Cardiac: Heart sounds are normal.  No pericardial rub.  No murmur. Lymphatic system: Cervical, axillary, inguinal, lymph nodes not palpable GI: Abdomen is soft.  No ascites.  Liver spleen not palpable.  No tenderness.  Bowel sounds are within normal limit Lower extremity: No edema Neurological system: Higher functions, cranial nerves intact no evidence of peripheral neuropathy. Skin: No rash.  No ecchymosis.Marland Kitchen   LAB RESULTS:  Appointment on 08/02/2015  Component Date Value Ref Range Status  . WBC 08/02/2015 10.6  3.8 - 10.6 K/uL Final  . RBC 08/02/2015 4.76  4.40 - 5.90 MIL/uL Final  . Hemoglobin 08/02/2015 15.2  13.0 - 18.0 g/dL Final  . HCT 08/02/2015 43.9  40.0 - 52.0 % Final  . MCV 08/02/2015 92.2  80.0 - 100.0 fL Final  . MCH 08/02/2015 31.8  26.0 - 34.0 pg Final  . MCHC 08/02/2015 34.5  32.0 - 36.0 g/dL Final  . RDW 08/02/2015 13.5  11.5 - 14.5 % Final  . Platelets 08/02/2015 233  150 - 440 K/uL Final  . Neutrophils Relative % 08/02/2015 64   Final  . Neutro Abs 08/02/2015 6.8* 1.4 - 6.5 K/uL Final  . Lymphocytes Relative 08/02/2015 25   Final  . Lymphs Abs 08/02/2015 2.6  1.0 - 3.6 K/uL Final  . Monocytes Relative 08/02/2015 9   Final  . Monocytes Absolute 08/02/2015 0.9  0.2 - 1.0 K/uL Final  . Eosinophils Relative 08/02/2015 1   Final  . Eosinophils Absolute 08/02/2015 0.1  0 - 0.7 K/uL Final  . Basophils Relative 08/02/2015 1   Final  . Basophils Absolute 08/02/2015 0.1  0 - 0.1 K/uL Final  . Sodium 08/02/2015 136  135 - 145 mmol/L Final  . Potassium 08/02/2015 3.8  3.5 - 5.1 mmol/L Final  . Chloride 08/02/2015 103  101 - 111 mmol/L Final  . CO2 08/02/2015 27  22 - 32 mmol/L Final  . Glucose, Bld 08/02/2015 104* 65 - 99 mg/dL Final  . BUN 08/02/2015 12  6 - 20 mg/dL Final  . Creatinine, Ser 08/02/2015 0.61  0.61 - 1.24 mg/dL Final  . Calcium 08/02/2015 9.3  8.9 - 10.3 mg/dL Final  . Total Protein 08/02/2015 7.2  6.5 - 8.1 g/dL Final  . Albumin  08/02/2015 4.1  3.5 - 5.0 g/dL Final  . AST 08/02/2015 16  15 - 41 U/L Final  . ALT 08/02/2015 14* 17 - 63 U/L Final  . Alkaline Phosphatase 08/02/2015 130* 38 - 126 U/L Final  . Total Bilirubin 08/02/2015 0.5  0.3 - 1.2 mg/dL Final  . GFR calc non Af Amer 08/02/2015 >60  >60 mL/min Final  . GFR calc Af Amer 08/02/2015 >60  >60 mL/min Final   Comment: (NOTE) The eGFR has been calculated using the CKD EPI equation. This calculation has not been validated in all clinical situations. eGFR's persistently <60 mL/min signify possible Chronic Kidney Disease.   Georgiann Hahn  gap 08/02/2015 6  5 - 15 Final  . PSA 08/02/2015 0.07  0.00 - 4.00 ng/mL Final   Comment: (NOTE) While PSA levels of <=4.0 ng/ml are reported as reference range, some men with levels below 4.0 ng/ml can have prostate cancer and many men with PSA above 4.0 ng/ml do not have prostate cancer.  Other tests such as free PSA, age specific reference ranges, PSA velocity and PSA doubling time may be helpful especially in men less than 32 years old. Performed at [facility]     No results found for: LABCA2 No results found for: CA199 Lab Results  Component Value Date   CEA 2.6 11/08/2014    Lab Results  Component Value Date   PSA 0.07 08/02/2015   CT scan of abdomen on March 13, 2015 IMPRESSION: 1. Interval decrease in volume of hepatic metastasis. 2. No evidence of periaortic retroperitoneal adenopathy. 3. Cholelithiasis Bone scan (August, 2016) MPRESSION: 1. Increased activity previously noted of the left shoulder is again noted but is increased in prominence from prior exam. Although these changes are most likely degenerative, shoulder series suggested for further evaluation. 2. Subtle increased activity noted over the left ischium. Pelvic series suggested for further evaluation  ASSESSMENT: 55 year old gentleman with stage IV carcinoma of prostate Patient is on ZYTIGA are tolerating treatment very  well.  In the past patient has refused Lupron or Taxotere chemotherapy Hypokalemia. Potassium level is improved .  Is 3.8. PSA 0.7   MEDICAL DECISION MAKING:   Lab data has been reviewed  Repeat CT scan and bone scan before next appointment. Continue ZYTIGA Cancer of prostate   Staging form: Prostate, AJCC 7th Edition     Clinical: Stage IV (T1c, N0, M1, PSA: Less than 10, Gleason 8-10 - Poorly differentiated/undifferentiated (marked anaplasia)) - Signed by Forest Gleason, MD on 12/09/2014   Forest Gleason, MD   08/03/2015 5:09 PM

## 2015-09-26 ENCOUNTER — Encounter
Admission: RE | Admit: 2015-09-26 | Discharge: 2015-09-26 | Disposition: A | Discharge status: Home / Self Care | Payer: Medicare Other | Source: Ambulatory Visit | Attending: Oncology | Admitting: Oncology

## 2015-09-26 ENCOUNTER — Ambulatory Visit
Admission: RE | Admit: 2015-09-26 | Discharge: 2015-09-26 | Disposition: A | Discharge status: Home / Self Care | Payer: Medicare Other | Source: Ambulatory Visit | Attending: Oncology | Admitting: Oncology

## 2015-09-26 ENCOUNTER — Ambulatory Visit: Payer: Medicare Other

## 2015-09-26 DIAGNOSIS — K8020 Calculus of gallbladder without cholecystitis without obstruction: Secondary | ICD-10-CM | POA: Insufficient documentation

## 2015-09-26 DIAGNOSIS — I2510 Atherosclerotic heart disease of native coronary artery without angina pectoris: Secondary | ICD-10-CM | POA: Insufficient documentation

## 2015-09-26 DIAGNOSIS — C61 Malignant neoplasm of prostate: Secondary | ICD-10-CM | POA: Diagnosis not present

## 2015-09-26 DIAGNOSIS — C787 Secondary malignant neoplasm of liver and intrahepatic bile duct: Secondary | ICD-10-CM

## 2015-09-26 DIAGNOSIS — M899 Disorder of bone, unspecified: Secondary | ICD-10-CM | POA: Diagnosis not present

## 2015-09-26 DIAGNOSIS — C7951 Secondary malignant neoplasm of bone: Secondary | ICD-10-CM | POA: Diagnosis not present

## 2015-09-26 DIAGNOSIS — Z8546 Personal history of malignant neoplasm of prostate: Secondary | ICD-10-CM | POA: Diagnosis not present

## 2015-09-26 LAB — POCT I-STAT CREATININE: CREATININE: 0.6 mg/dL — AB (ref 0.61–1.24)

## 2015-09-26 MED ORDER — TECHNETIUM TC 99M MEDRONATE IV KIT
25.0000 | PACK | Freq: Once | INTRAVENOUS | Status: AC | PRN
  Administered 2015-09-26: 22.1 via INTRAVENOUS

## 2015-09-26 MED ORDER — IOHEXOL 300 MG/ML  SOLN
100.0000 mL | Freq: Once | INTRAMUSCULAR | Status: AC | PRN
  Administered 2015-09-26: 100 mL via INTRAVENOUS

## 2015-10-01 ENCOUNTER — Inpatient Hospital Stay: Payer: Medicare Other

## 2015-10-01 ENCOUNTER — Inpatient Hospital Stay: Payer: Medicare Other | Attending: Oncology | Admitting: Oncology

## 2015-10-01 ENCOUNTER — Encounter: Payer: Self-pay | Admitting: Oncology

## 2015-10-01 VITALS — BP 141/90 | HR 78 | Temp 96.9°F | Resp 22 | Wt 159.5 lb

## 2015-10-01 DIAGNOSIS — C61 Malignant neoplasm of prostate: Secondary | ICD-10-CM

## 2015-10-01 DIAGNOSIS — E786 Lipoprotein deficiency: Secondary | ICD-10-CM | POA: Diagnosis not present

## 2015-10-01 DIAGNOSIS — K8020 Calculus of gallbladder without cholecystitis without obstruction: Secondary | ICD-10-CM | POA: Insufficient documentation

## 2015-10-01 DIAGNOSIS — Z79899 Other long term (current) drug therapy: Secondary | ICD-10-CM

## 2015-10-01 DIAGNOSIS — C787 Secondary malignant neoplasm of liver and intrahepatic bile duct: Secondary | ICD-10-CM

## 2015-10-01 DIAGNOSIS — E876 Hypokalemia: Secondary | ICD-10-CM | POA: Diagnosis not present

## 2015-10-01 DIAGNOSIS — I2510 Atherosclerotic heart disease of native coronary artery without angina pectoris: Secondary | ICD-10-CM | POA: Diagnosis not present

## 2015-10-01 DIAGNOSIS — Z8673 Personal history of transient ischemic attack (TIA), and cerebral infarction without residual deficits: Secondary | ICD-10-CM | POA: Diagnosis not present

## 2015-10-01 DIAGNOSIS — M899 Disorder of bone, unspecified: Secondary | ICD-10-CM | POA: Insufficient documentation

## 2015-10-01 DIAGNOSIS — C7951 Secondary malignant neoplasm of bone: Secondary | ICD-10-CM

## 2015-10-01 LAB — CBC WITH DIFFERENTIAL/PLATELET
BASOS ABS: 0.1 10*3/uL (ref 0–0.1)
BASOS PCT: 1 %
EOS ABS: 0.1 10*3/uL (ref 0–0.7)
Eosinophils Relative: 1 %
HCT: 44.3 % (ref 40.0–52.0)
Hemoglobin: 15.4 g/dL (ref 13.0–18.0)
Lymphocytes Relative: 23 %
Lymphs Abs: 2.2 10*3/uL (ref 1.0–3.6)
MCH: 32.2 pg (ref 26.0–34.0)
MCHC: 34.8 g/dL (ref 32.0–36.0)
MCV: 92.5 fL (ref 80.0–100.0)
MONO ABS: 0.8 10*3/uL (ref 0.2–1.0)
MONOS PCT: 9 %
NEUTROS ABS: 6.3 10*3/uL (ref 1.4–6.5)
Neutrophils Relative %: 66 %
PLATELETS: 212 10*3/uL (ref 150–440)
RBC: 4.79 MIL/uL (ref 4.40–5.90)
RDW: 13.4 % (ref 11.5–14.5)
WBC: 9.5 10*3/uL (ref 3.8–10.6)

## 2015-10-01 LAB — COMPREHENSIVE METABOLIC PANEL
ALBUMIN: 4.3 g/dL (ref 3.5–5.0)
ALT: 11 U/L — ABNORMAL LOW (ref 17–63)
ANION GAP: 8 (ref 5–15)
AST: 14 U/L — AB (ref 15–41)
Alkaline Phosphatase: 117 U/L (ref 38–126)
BUN: 12 mg/dL (ref 6–20)
CHLORIDE: 102 mmol/L (ref 101–111)
CO2: 28 mmol/L (ref 22–32)
Calcium: 8.9 mg/dL (ref 8.9–10.3)
Creatinine, Ser: 0.77 mg/dL (ref 0.61–1.24)
GFR calc Af Amer: >60 mL/min (ref >60)
GFR calc non Af Amer: >60 mL/min (ref >60)
GLUCOSE: 96 mg/dL (ref 65–99)
POTASSIUM: 2.9 mmol/L — AB (ref 3.5–5.1)
SODIUM: 138 mmol/L (ref 135–145)
TOTAL PROTEIN: 7.2 g/dL (ref 6.5–8.1)
Total Bilirubin: 0.6 mg/dL (ref 0.3–1.2)

## 2015-10-01 LAB — PSA: PSA: 0.1 ng/mL (ref 0.00–4.00)

## 2015-10-01 MED ORDER — TAMSULOSIN HCL 0.4 MG PO CAPS: 0.4000 mg | ORAL_CAPSULE | Freq: Every day | ORAL | Status: DC

## 2015-10-01 NOTE — Progress Notes (Signed)
Pt having increased urinary frequency. Wakes up every 2 hours at night.

## 2015-10-01 NOTE — Progress Notes (Signed)
[place] @ [facility] Telephone:(336) [phone]  Fax:(336) [place]. OB: [birth year]  MR#: [id number]  IPJ#:[id number]  Patient Care Team: Forest Gleason, MD as PCP - General (Oncology) Clent Jacks, RN as Registered Nurse  CHIEF COMPLAINT:  Chief Complaint  Patient presents with  . Prostate Cancer    Oncology History   Carcinoma prostate adenocarcinoma in multiple fragments.  Gleason grade 8.  Involving neutral space.  Large duct and S cylinder-type.  Poorly differentiated.  Lateral neck involvement.Marland Kitchen PSAs low 2.4.  CT scan of abdomen was abnormal with multiple liver lesions.  Diagnosis in April of 2016 Clinical staging T1 cN0 M1 stage IV disease 2, CT-guided liver biopsies consistent with metastases from the prostate cancer   3.  Patient refused any chemotherapy or Lupron Started on ZYTIGA and prednisone from May of 2016     Cancer of prostate [facility])   12/09/2014 Initial Diagnosis Cancer of prostate     INTERVAL HISTORY:   55 year old gentleman with a history of stage IV carcinoma of prostate refuses to get androgen deprivation therapy with Lupron.  Patient refuses port placement and unwilling to get Taxotere chemotherapy Here to discuss further planning of treatment.. Patient is taking ZYTIGA not taking prednisone as prescribed.  Patient does not want any Lupron injection.  On Xgeva injection.  Here for stage IV carcinoma prostate Denies any aches and pains.  No nausea no vomiting no diarrhea appetite remains stable Patient is continuing ZYTIGA not taking prednisone on a regular basis.  Patient also has hypokalemia and does not want to take in oral potassium and trying to drink orange juice does not want any intravenous potassium. Had a repeat CT scan.  Which has been reviewed independently and reviewed with the patient today. REVIEW OF SYSTEMS:   GENERAL:  Feels good.  Active.  No fevers, sweats or weight loss. PERFORMANCE STATUS (ECOG):01 HEENT:  No  visual changes, runny nose, sore throat, mouth sores or tenderness. Lungs: No shortness of breath or cough.  No hemoptysis. Cardiac:  No chest pain, palpitations, orthopnea, or PND. GI:  No nausea, vomiting, diarrhea, constipation, melena or hematochezia. GU:  No urgency, frequency, dysuria, or hematuria. Musculoskeletal:  No back pain.  No joint pain.  No muscle tenderness. Extremities:  No pain or swelling. Skin:  No rashes or skin changes. Neuro:  No headache, numbness or weakness, balance or coordination issues. Endocrine:  No diabetes, thyroid issues, hot flashes or night sweats. Psych:  No mood changes, depression or anxiety. Pain:  No focal pain. Review of systems:  All other systems reviewed and found to be negative.  As per HPI. Otherwise, a complete review of systems is negatve.  PAST MEDICAL HISTORY: Past Medical History  Diagnosis Date  . Stroke [facility])     at 55 years of age  . Neuromuscular disorder ([place])     brain tumor at 55 years of age  . Cancer ([place])   . Cancer of prostate ([place]) 12/09/2014    PAST SURGICAL HISTORY: H/0 glioma and status post resection. FAMILY HISTORY There is no significant family history of breast cancer, ovarian cancer, colon cancer      ADVANCED DIRECTIVES: Does not have any living will refuse to consider at this point in time HEALTH MAINTENANCE   No Known Allergies Current Outpatient Prescriptions  Medication Sig Dispense Refill  . omeprazole (PRILOSEC) 20 MG capsule Take 1 capsule (20 mg total) by mouth daily. 30 capsule 3  .  potassium chloride (KLOR-CON) 20 MEQ packet Take 20 mEq by mouth 2 (two) times daily. 60 packet 3  . predniSONE (DELTASONE) 5 MG tablet Take 5 mg by mouth daily with breakfast.     . ZYTIGA 250 MG tablet Take 1,000 mg by mouth daily.     . tamsulosin (FLOMAX) 0.4 MG CAPS capsule Take 1 capsule (0.4 mg total) by mouth daily after breakfast. 30 capsule 3   No current facility-administered medications for this  visit.    OBJECTIVE:  Filed Vitals:   10/01/15 1114  BP: 141/90  Pulse: 78  Temp: 96.9 F (36.1 C)  Resp: 22     Body mass index is 24.98 kg/(m^2).    ECOG FS:0 - Asymptomatic  PHYSICAL EXAM Goal status: Performance status is good.  Patient has not lost significant weight HEENT: No evidence of stomatitis. Sclera and conjunctivae :: No jaundice.   pale looking. Lungs: Air  entry equal on both sides.  No rhonchi.  No rales.  Cardiac: Heart sounds are normal.  No pericardial rub.  No murmur. Lymphatic system: Cervical, axillary, inguinal, lymph nodes not palpable GI: Abdomen is soft.  No ascites.  Liver spleen not palpable.  No tenderness.  Bowel sounds are within normal limit Lower extremity: No edema Neurological system: Higher functions, cranial nerves intact no evidence of peripheral neuropathy. Skin: No rash.  No ecchymosis.Marland Kitchen   LAB RESULTS:  Appointment on 10/01/2015  Component Date Value Ref Range Status  . WBC 10/01/2015 9.5  3.8 - 10.6 K/uL Final  . RBC 10/01/2015 4.79  4.40 - 5.90 MIL/uL Final  . Hemoglobin 10/01/2015 15.4  13.0 - 18.0 g/dL Final  . HCT 10/01/2015 44.3  40.0 - 52.0 % Final  . MCV 10/01/2015 92.5  80.0 - 100.0 fL Final  . MCH 10/01/2015 32.2  26.0 - 34.0 pg Final  . MCHC 10/01/2015 34.8  32.0 - 36.0 g/dL Final  . RDW 10/01/2015 13.4  11.5 - 14.5 % Final  . Platelets 10/01/2015 212  150 - 440 K/uL Final  . Neutrophils Relative % 10/01/2015 66   Final  . Neutro Abs 10/01/2015 6.3  1.4 - 6.5 K/uL Final  . Lymphocytes Relative 10/01/2015 23   Final  . Lymphs Abs 10/01/2015 2.2  1.0 - 3.6 K/uL Final  . Monocytes Relative 10/01/2015 9   Final  . Monocytes Absolute 10/01/2015 0.8  0.2 - 1.0 K/uL Final  . Eosinophils Relative 10/01/2015 1   Final  . Eosinophils Absolute 10/01/2015 0.1  0 - 0.7 K/uL Final  . Basophils Relative 10/01/2015 1   Final  . Basophils Absolute 10/01/2015 0.1  0 - 0.1 K/uL Final  . Sodium 10/01/2015 138  135 - 145 mmol/L Final  .  Potassium 10/01/2015 2.9* 3.5 - 5.1 mmol/L Final   Comment: RESULTS VERIFIED BY REPEAT TESTING CRITICAL RESULT CALLED TO, READ BACK BY AND VERIFIED WITH HAYLEY RHODE AT 1114 10/01/2015 KMR   . Chloride 10/01/2015 102  101 - 111 mmol/L Final  . CO2 10/01/2015 28  22 - 32 mmol/L Final  . Glucose, Bld 10/01/2015 96  65 - 99 mg/dL Final  . BUN 10/01/2015 12  6 - 20 mg/dL Final  . Creatinine, Ser 10/01/2015 0.77  0.61 - 1.24 mg/dL Final  . Calcium 10/01/2015 8.9  8.9 - 10.3 mg/dL Final  . Total Protein 10/01/2015 7.2  6.5 - 8.1 g/dL Final  . Albumin 10/01/2015 4.3  3.5 - 5.0 g/dL Final  . AST 10/01/2015 14*  15 - 41 U/L Final  . ALT 10/01/2015 11* 17 - 63 U/L Final  . Alkaline Phosphatase 10/01/2015 117  38 - 126 U/L Final  . Total Bilirubin 10/01/2015 0.6  0.3 - 1.2 mg/dL Final  . GFR calc non Af Amer 10/01/2015 >60  >60 mL/min Final  . GFR calc Af Amer 10/01/2015 >60  >60 mL/min Final   Comment: (NOTE) The eGFR has been calculated using the CKD EPI equation. This calculation has not been validated in all clinical situations. eGFR's persistently <60 mL/min signify possible Chronic Kidney Disease.   . Anion gap 10/01/2015 8  5 - 15 Final    No results found for: LABCA2 No results found for: CA199 Lab Results  Component Value Date   CEA 2.6 11/08/2014    Lab Results  Component Value Date   PSA 0.07 08/02/2015  IMPRESSION: 1. Similar to minimal improvement in hepatic metastasis. 2. Left-sided L5 pedicle sclerotic lesion is suspicious for osseous metastasis. Minimally decreased since the prior exam. Recommend attention on today's bone scan. 3. Age advanced coronary artery atherosclerosis. Recommend assessment of coronary risk factors and consideration of medical therapy. 4. Cholelithiasis IMPRESSION: 1. No new focus of increased activity is seen. 2. The foci of activity noted previously primarily in the left [facility] joint, right seventh costovertebral junction, and left ischium  are unchanged.   Electronically Signed  By: Ivar Drape M.D.  On: 09/26/2015 11:39  ASSESSMENT: 55 year old gentleman with stage IV carcinoma of prostate Patient is on ZYTIGA are tolerating treatment very well.  In the past patient has refused Lupron or Taxotere chemotherapy   Patient does not have any back pain.  CT scan and a bone scan has been reviewed independently.  Liver metastases have decreased in size Patient does not want any radiation or XGEVA.  We will continue ZYTIGA.  He was encouraged to eat but none and orange juice has potassium level is 2.9 Reevaluate patient in few months More than 25 minutes was spent in reviewing (a total visit of 50 minutes) reviewing the records CT scan with the patient and discussing options of therapy and need for continuing ZYTIGA.   MEDICAL DECISION MAKING:   Lab data has been reviewed  Repeat CT scan and bone scan before next appointment. Continue ZYTIGA Cancer of prostate   Staging form: Prostate, AJCC 7th Edition     Clinical: Stage IV (T1c, N0, M1, PSA: Less than 10, Gleason 8-10 - Poorly differentiated/undifferentiated (marked anaplasia)) - Signed by Forest Gleason, MD on 12/09/2014   Forest Gleason, MD   10/01/2015 2:23 PM

## 2015-11-02 ENCOUNTER — Telehealth: Payer: Self-pay | Admitting: *Deleted

## 2015-11-02 DIAGNOSIS — C61 Malignant neoplasm of prostate: Secondary | ICD-10-CM

## 2015-11-02 MED ORDER — ZYTIGA 250 MG PO TABS: 1000.0000 mg | ORAL_TABLET | Freq: Every day | ORAL | Status: DC

## 2015-11-02 MED ORDER — PREDNISONE 5 MG PO TABS: 5.0000 mg | ORAL_TABLET | Freq: Every day | ORAL | Status: DC

## 2015-11-02 NOTE — Telephone Encounter (Signed)
Refill for zytiga and prednisone sent to [facility]. Per last MD note, pt is to continue zytiga and prednisone at this time.

## 2015-11-05 DIAGNOSIS — H5340 Unspecified visual field defects: Secondary | ICD-10-CM | POA: Diagnosis not present

## 2015-11-13 ENCOUNTER — Inpatient Hospital Stay: Payer: Medicare Other

## 2015-11-13 ENCOUNTER — Inpatient Hospital Stay: Payer: Medicare Other | Admitting: Oncology

## 2015-11-16 ENCOUNTER — Encounter: Payer: Self-pay | Admitting: *Deleted

## 2015-11-22 DIAGNOSIS — M6281 Muscle weakness (generalized): Secondary | ICD-10-CM | POA: Diagnosis not present

## 2015-11-22 DIAGNOSIS — C61 Malignant neoplasm of prostate: Secondary | ICD-10-CM | POA: Diagnosis not present

## 2015-11-22 DIAGNOSIS — Z0289 Encounter for other administrative examinations: Secondary | ICD-10-CM | POA: Diagnosis not present

## 2015-12-12 ENCOUNTER — Encounter: Payer: Self-pay | Admitting: Family Medicine

## 2015-12-12 ENCOUNTER — Inpatient Hospital Stay: Payer: Medicare Other

## 2015-12-12 ENCOUNTER — Inpatient Hospital Stay: Payer: Medicare Other | Attending: Oncology | Admitting: Family Medicine

## 2015-12-12 VITALS — BP 166/90 | HR 80 | Temp 97.1°F | Resp 19 | Wt 157.0 lb

## 2015-12-12 DIAGNOSIS — C7951 Secondary malignant neoplasm of bone: Secondary | ICD-10-CM | POA: Diagnosis not present

## 2015-12-12 DIAGNOSIS — Z79899 Other long term (current) drug therapy: Secondary | ICD-10-CM | POA: Insufficient documentation

## 2015-12-12 DIAGNOSIS — F17210 Nicotine dependence, cigarettes, uncomplicated: Secondary | ICD-10-CM | POA: Diagnosis not present

## 2015-12-12 DIAGNOSIS — C787 Secondary malignant neoplasm of liver and intrahepatic bile duct: Secondary | ICD-10-CM | POA: Insufficient documentation

## 2015-12-12 DIAGNOSIS — Z7952 Long term (current) use of systemic steroids: Secondary | ICD-10-CM | POA: Insufficient documentation

## 2015-12-12 DIAGNOSIS — Z8673 Personal history of transient ischemic attack (TIA), and cerebral infarction without residual deficits: Secondary | ICD-10-CM | POA: Diagnosis not present

## 2015-12-12 DIAGNOSIS — Z8669 Personal history of other diseases of the nervous system and sense organs: Secondary | ICD-10-CM | POA: Diagnosis not present

## 2015-12-12 DIAGNOSIS — E876 Hypokalemia: Secondary | ICD-10-CM | POA: Diagnosis not present

## 2015-12-12 DIAGNOSIS — C61 Malignant neoplasm of prostate: Secondary | ICD-10-CM

## 2015-12-12 LAB — CBC WITH DIFFERENTIAL/PLATELET
BASOS ABS: 0.1 10*3/uL (ref 0–0.1)
BASOS PCT: 1 %
EOS ABS: 0.1 10*3/uL (ref 0–0.7)
Eosinophils Relative: 1 %
HEMATOCRIT: 42.7 % (ref 40.0–52.0)
HEMOGLOBIN: 15.2 g/dL (ref 13.0–18.0)
Lymphocytes Relative: 24 %
Lymphs Abs: 2.5 10*3/uL (ref 1.0–3.6)
MCH: 32.6 pg (ref 26.0–34.0)
MCHC: 35.6 g/dL (ref 32.0–36.0)
MCV: 91.7 fL (ref 80.0–100.0)
Monocytes Absolute: 0.8 10*3/uL (ref 0.2–1.0)
Monocytes Relative: 8 %
NEUTROS ABS: 7 10*3/uL — AB (ref 1.4–6.5)
NEUTROS PCT: 66 %
Platelets: 214 10*3/uL (ref 150–440)
RBC: 4.65 MIL/uL (ref 4.40–5.90)
RDW: 13.6 % (ref 11.5–14.5)
WBC: 10.5 10*3/uL (ref 3.8–10.6)

## 2015-12-12 LAB — COMPREHENSIVE METABOLIC PANEL
ALBUMIN: 4.3 g/dL (ref 3.5–5.0)
ALK PHOS: 115 U/L (ref 38–126)
ALT: 11 U/L — ABNORMAL LOW (ref 17–63)
AST: 19 U/L (ref 15–41)
Anion gap: 6 (ref 5–15)
BILIRUBIN TOTAL: 0.7 mg/dL (ref 0.3–1.2)
BUN: 9 mg/dL (ref 6–20)
CO2: 27 mmol/L (ref 22–32)
Calcium: 9.2 mg/dL (ref 8.9–10.3)
Chloride: 105 mmol/L (ref 101–111)
Creatinine, Ser: 0.7 mg/dL (ref 0.61–1.24)
GFR calc Af Amer: >60 mL/min (ref >60)
GFR calc non Af Amer: >60 mL/min (ref >60)
GLUCOSE: 114 mg/dL — AB (ref 65–99)
POTASSIUM: 2.3 mmol/L — AB (ref 3.5–5.1)
SODIUM: 138 mmol/L (ref 135–145)
TOTAL PROTEIN: 7.1 g/dL (ref 6.5–8.1)

## 2015-12-12 LAB — PSA: PSA: 0.12 ng/mL (ref 0.00–4.00)

## 2015-12-12 NOTE — Progress Notes (Signed)
Pt here for Prostate cancer. Pt is taking 2 Zytiga per day. Never got a refill since April. Appetite is good. Denies constipation. Voids all the time. Flomax makes him nauseated, he quit taking it. Drinks OJ for low potassium. Hates bananas. Stays hot, sweats all the time, he has been like that his whole life. Has a cough occ.productive yellow in color.Marland Kitchen

## 2015-12-12 NOTE — Progress Notes (Signed)
[place]  Telephone:(336) [phone]  Fax:(336) [place] DOB: 1961/02/12  MR#: [id number]  KYH#:[id number]  Patient Care Team: Forest Gleason, MD as PCP - General (Oncology) Clent Jacks, RN as Registered Nurse  CHIEF COMPLAINT:  Chief Complaint  Patient presents with  . Follow-up    Prostate Cancer    INTERVAL HISTORY: Patient is here for further follow-up regarding stage IV carcinoma of the prostate with metastases to the liver and bone. Patient reports overall feeling extremely well. He continues to drink alcohol and smokes cigarettes daily. He states that he has not received his May shipment of Fabio Asa so he has stopped taking 4 capsules a day and is currently only taking 2 in order to spread them out. Patient had some forms that needed to be filled out an order for Wynetta Emery and [employer] to supply drug to him, and these were filled out my office today. Patient also reports not taking any of his other medications as directed and states that he just does not want to. He refuses to take prednisone along with Zytiga as well as his Flomax and any potassium supplements that have been ordered. Patient also refuses to take any dietary supplementation of potassium.  REVIEW OF SYSTEMS:   Review of Systems  Constitutional: Negative for fever, chills, weight loss, malaise/fatigue and diaphoresis.  HENT: Negative.   Eyes: Negative.   Respiratory: Negative for cough, hemoptysis, sputum production, shortness of breath and wheezing.   Cardiovascular: Negative for chest pain, palpitations, orthopnea, claudication, leg swelling and PND.  Gastrointestinal: Negative for heartburn, nausea, vomiting, abdominal pain, diarrhea, constipation, blood in stool and melena.  Genitourinary: Negative.   Musculoskeletal: Negative.   Skin: Negative.   Neurological: Negative for dizziness, tingling, focal weakness, seizures and weakness.  Endo/Heme/Allergies: Does not  bruise/bleed easily.  Psychiatric/Behavioral: Negative for depression. The patient is not nervous/anxious and does not have insomnia.     As per HPI. Otherwise, a complete review of systems is negatve.  ONCOLOGY HISTORY: Oncology History   Carcinoma prostate adenocarcinoma in multiple fragments.  Gleason grade 8.  Involving neutral space.  Large duct and S cylinder-type.  Poorly differentiated.  Lateral neck involvement.Marland Kitchen PSAs low 2.4.  CT scan of abdomen was abnormal with multiple liver lesions.  Diagnosis in April of 2016 Clinical staging T1 cN0 M1 stage IV disease 2, CT-guided liver biopsies consistent with metastases from the prostate cancer   3.  Patient refused any chemotherapy or Lupron Started on ZYTIGA and prednisone from May of 2016     Cancer of prostate [facility])   12/09/2014 Initial Diagnosis Cancer of prostate    PAST MEDICAL HISTORY: Past Medical History  Diagnosis Date  . Stroke [facility])     at 55 years of age  . Neuromuscular disorder ([place])     brain tumor at 54 years of age  . Cancer ([place])   . Cancer of prostate ([place]) 12/09/2014    PAST SURGICAL HISTORY: No past surgical history on file.  FAMILY HISTORY No family history on file.  GYNECOLOGIC HISTORY:  No LMP for male patient.     ADVANCED DIRECTIVES:    HEALTH MAINTENANCE: Social History  Substance Use Topics  . Smoking status: Current Every Day Smoker -- 0.50 packs/day    Types: Cigarettes  . Smokeless tobacco: Not on file  . Alcohol Use: No      No Known Allergies  Current Outpatient Prescriptions  Medication Sig Dispense Refill  .  ZYTIGA 250 MG tablet Take 4 tablets (1,000 mg total) by mouth daily. 120 tablet 3  . omeprazole (PRILOSEC) 20 MG capsule Take 1 capsule (20 mg total) by mouth daily. (Patient not taking: Reported on 12/12/2015) 30 capsule 3  . potassium chloride (KLOR-CON) 20 MEQ packet Take 20 mEq by mouth 2 (two) times daily. (Patient not taking: Reported on 12/12/2015) 60 packet 3  .  predniSONE (DELTASONE) 5 MG tablet Take 1 tablet (5 mg total) by mouth daily with breakfast. (Patient not taking: Reported on 12/12/2015) 30 tablet 3  . tamsulosin (FLOMAX) 0.4 MG CAPS capsule Take 1 capsule (0.4 mg total) by mouth daily after breakfast. (Patient not taking: Reported on 12/12/2015) 30 capsule 3   No current facility-administered medications for this visit.    OBJECTIVE: BP 166/90 mmHg  Pulse 80  Temp(Src) 97.1 F (36.2 C) (Tympanic)  Resp 19  Wt 156 lb 15.5 oz (71.2 kg)   Body mass index is 24.58 kg/(m^2).    ECOG FS:0 - Asymptomatic  General: Well-developed, well-nourished, no acute distress. Lungs: Clear to auscultation bilaterally. Heart: Regular rate and rhythm. No rubs, murmurs, or gallops. Abdomen: Soft, nontender, nondistended. No organomegaly noted, normoactive bowel sounds. Musculoskeletal: No edema, cyanosis, or clubbing. Neuro: Alert, answering all questions appropriately. Cranial nerves grossly intact. Skin: No rashes or petechiae noted. Psych: Normal affect. Lymphatics: No cervical, clavicular LAD.   LAB RESULTS:  Appointment on 12/12/2015  Component Date Value Ref Range Status  . WBC 12/12/2015 10.5  3.8 - 10.6 K/uL Final  . RBC 12/12/2015 4.65  4.40 - 5.90 MIL/uL Final  . Hemoglobin 12/12/2015 15.2  13.0 - 18.0 g/dL Final  . HCT 12/12/2015 42.7  40.0 - 52.0 % Final  . MCV 12/12/2015 91.7  80.0 - 100.0 fL Final  . MCH 12/12/2015 32.6  26.0 - 34.0 pg Final  . MCHC 12/12/2015 35.6  32.0 - 36.0 g/dL Final  . RDW 12/12/2015 13.6  11.5 - 14.5 % Final  . Platelets 12/12/2015 214  150 - 440 K/uL Final  . Neutrophils Relative % 12/12/2015 66   Final  . Neutro Abs 12/12/2015 7.0* 1.4 - 6.5 K/uL Final  . Lymphocytes Relative 12/12/2015 24   Final  . Lymphs Abs 12/12/2015 2.5  1.0 - 3.6 K/uL Final  . Monocytes Relative 12/12/2015 8   Final  . Monocytes Absolute 12/12/2015 0.8  0.2 - 1.0 K/uL Final  . Eosinophils Relative 12/12/2015 1   Final  .  Eosinophils Absolute 12/12/2015 0.1  0 - 0.7 K/uL Final  . Basophils Relative 12/12/2015 1   Final  . Basophils Absolute 12/12/2015 0.1  0 - 0.1 K/uL Final  . Sodium 12/12/2015 138  135 - 145 mmol/L Final  . Potassium 12/12/2015 2.3* 3.5 - 5.1 mmol/L Final   Comment: RESULTS VERIFIED BY REPEAT TESTING CRITICAL RESULT CALLED TO, READ BACK BY AND VERIFIED WITH HAYLEY RHODE AT 0935 12/12/2015 KMR   . Chloride 12/12/2015 105  101 - 111 mmol/L Final  . CO2 12/12/2015 27  22 - 32 mmol/L Final  . Glucose, Bld 12/12/2015 114* 65 - 99 mg/dL Final  . BUN 12/12/2015 9  6 - 20 mg/dL Final  . Creatinine, Ser 12/12/2015 0.70  0.61 - 1.24 mg/dL Final  . Calcium 12/12/2015 9.2  8.9 - 10.3 mg/dL Final  . Total Protein 12/12/2015 7.1  6.5 - 8.1 g/dL Final  . Albumin 12/12/2015 4.3  3.5 - 5.0 g/dL Final  . AST 12/12/2015 19  15 -  41 U/L Final  . ALT 12/12/2015 11* 17 - 63 U/L Final  . Alkaline Phosphatase 12/12/2015 115  38 - 126 U/L Final  . Total Bilirubin 12/12/2015 0.7  0.3 - 1.2 mg/dL Final  . GFR calc non Af Amer 12/12/2015 >60  >60 mL/min Final  . GFR calc Af Amer 12/12/2015 >60  >60 mL/min Final   Comment: (NOTE) The eGFR has been calculated using the CKD EPI equation. This calculation has not been validated in all clinical situations. eGFR's persistently <60 mL/min signify possible Chronic Kidney Disease.   . Anion gap 12/12/2015 6  5 - 15 Final    STUDIES: No results found.  ASSESSMENT:  Carcinoma of the prostate, Gleason grade 8, stage IV T1 cN0 M1, with metastases to liver and bone.  PLAN:   1. Carcinoma of prostate. Patient has previously refused any antiestrogen therapy, Lupron, or chemotherapy with Taxotere. Patient also refused any radiation therapy or xgeva. He is currently on Zytiga and tolerating well but he has not taken as directed due to low supply. He is also refusing to take the prednisone as directed. He has signed some information and forms for Wynetta Emery and [employer] to  supply his drug for him. He was notified of the need for this several weeks ago but never came into the office to sign such forms. Patient also noted to have a critically low potassium today of 2.3. He has refused any supplementation including IV, oral medications, or dietary supplementation. Patient was again advised that this is a life-threatening event but has refused any further treatments and states that he just wants to die happy. Advised patient that we would continue with routine follow-up, he is agreeable to reevaluation with CT scan and bone scan prior to next appointment. We'll continue with routine follow-up in 3 months. Patient expressed understanding and was in agreement with this plan. He also understands that He can call clinic at any time with any questions, concerns, or complaints.   Dr. Oliva Bustard was available for consultation and review of plan of care for this patient.  Cancer of prostate [facility])   Staging form: Prostate, AJCC 7th Edition     Clinical: Stage IV (T1c, N0, M1, PSA: Less than 10, Gleason 8-10 - Poorly differentiated/undifferentiated (marked anaplasia)) - Signed by Forest Gleason, MD on 12/09/2014   Evlyn Kanner, NP   12/12/2015 9:43 AM

## 2016-03-03 ENCOUNTER — Other Ambulatory Visit: Payer: Self-pay | Admitting: *Deleted

## 2016-03-03 DIAGNOSIS — C61 Malignant neoplasm of prostate: Secondary | ICD-10-CM

## 2016-03-03 MED ORDER — ZYTIGA 250 MG PO TABS: 1000.0000 mg | ORAL_TABLET | Freq: Every day | ORAL | 0 refills | Status: DC

## 2016-03-03 NOTE — Telephone Encounter (Signed)
  OK to refill  M 

## 2016-03-03 NOTE — Telephone Encounter (Signed)
Only has 3 days of Zytiga left and does not see MD until 03/19/16. Do You wan to refill?

## 2016-03-11 ENCOUNTER — Encounter
Admission: RE | Admit: 2016-03-11 | Discharge: 2016-03-11 | Disposition: A | Discharge status: Home / Self Care | Payer: Medicare Other | Source: Ambulatory Visit | Attending: Family Medicine | Admitting: Family Medicine

## 2016-03-11 ENCOUNTER — Ambulatory Visit
Admission: RE | Admit: 2016-03-11 | Discharge: 2016-03-11 | Disposition: A | Discharge status: Home / Self Care | Payer: Medicare Other | Source: Ambulatory Visit | Attending: Family Medicine | Admitting: Family Medicine

## 2016-03-11 ENCOUNTER — Ambulatory Visit: Admission: RE | Admit: 2016-03-11 | Payer: Medicare Other | Source: Ambulatory Visit

## 2016-03-11 DIAGNOSIS — C61 Malignant neoplasm of prostate: Secondary | ICD-10-CM | POA: Insufficient documentation

## 2016-03-11 DIAGNOSIS — R911 Solitary pulmonary nodule: Secondary | ICD-10-CM | POA: Insufficient documentation

## 2016-03-11 DIAGNOSIS — I700 Atherosclerosis of aorta: Secondary | ICD-10-CM | POA: Diagnosis not present

## 2016-03-11 DIAGNOSIS — C787 Secondary malignant neoplasm of liver and intrahepatic bile duct: Secondary | ICD-10-CM | POA: Insufficient documentation

## 2016-03-11 MED ORDER — TECHNETIUM TC 99M MEDRONATE IV KIT
23.8300 | PACK | Freq: Once | INTRAVENOUS | Status: AC | PRN
  Administered 2016-03-11: 23.83 via INTRAVENOUS

## 2016-03-11 MED ORDER — IOPAMIDOL (ISOVUE-300) INJECTION 61%
100.0000 mL | Freq: Once | INTRAVENOUS | Status: AC | PRN
  Administered 2016-03-11: 100 mL via INTRAVENOUS

## 2016-03-13 ENCOUNTER — Ambulatory Visit: Payer: Medicare Other | Admitting: Oncology

## 2016-03-13 ENCOUNTER — Other Ambulatory Visit: Payer: Medicare Other

## 2016-03-19 ENCOUNTER — Inpatient Hospital Stay: Payer: Medicare Other

## 2016-03-19 ENCOUNTER — Inpatient Hospital Stay: Payer: Medicare Other | Admitting: Oncology

## 2016-04-08 ENCOUNTER — Other Ambulatory Visit: Payer: Self-pay | Admitting: *Deleted

## 2016-04-08 DIAGNOSIS — C61 Malignant neoplasm of prostate: Secondary | ICD-10-CM

## 2016-04-08 NOTE — Progress Notes (Signed)
[place]  Telephone:(336) [phone]  Fax:(336) [place] DOB: Jan 15, 1961  MR#: [id number]  CNO#:[id number]  Patient Care Team: Forest Gleason, MD as PCP - General (Oncology) Clent Jacks, RN as Registered Nurse  CHIEF COMPLAINT:  Stage IV prostate cancer Gleason grade 8 with metastases to liver and bone.  INTERVAL HISTORY:  Patient returns to clinic today for repeat laboratory work and routine evaluation. He currently feels well and is asymptomatic. He is only taking 2 tabs Zytiga daily rather than the recommended 4. He only takes his prednisone intermittently. He continues to refuse any other interventions. He continues to have only drink alcohol. He has no neurologic complaints. He denies any pain. He has a good appetite and denies weight loss. He denies any recent fevers or illnesses. He denies any nausea, vomiting, constipation, or diarrhea. He does admit to urinary frequency especially at night, but no other urinary complaints. Patient otherwise feels well and offers no further specific complaints.   REVIEW OF SYSTEMS:   Review of Systems  Constitutional: Negative.  Negative for fever, malaise/fatigue and weight loss.  Respiratory: Negative.  Negative for cough.   Cardiovascular: Negative.  Negative for chest pain and leg swelling.  Gastrointestinal: Negative.  Negative for abdominal pain.  Genitourinary: Positive for frequency.  Musculoskeletal: Negative.   Neurological: Negative.  Negative for weakness.  Psychiatric/Behavioral: Negative.  The patient is not nervous/anxious.     As per HPI. Otherwise, a complete review of systems is negative.  ONCOLOGY HISTORY: Oncology History   Carcinoma prostate adenocarcinoma in multiple fragments.  Gleason grade 8.  Involving neutral space.  Large duct and S cylinder-type.  Poorly differentiated.  Lateral neck involvement.Marland Kitchen PSAs low 2.4.  CT scan of abdomen was abnormal with multiple liver lesions.   Diagnosis in April of 2016 Clinical staging T1 cN0 M1 stage IV disease 2, CT-guided liver biopsies consistent with metastases from the prostate cancer   3.  Patient refused any chemotherapy or Lupron Started on ZYTIGA and prednisone from May of 2016     Prostate cancer metastatic to bone [facility])   12/09/2014 Initial Diagnosis    Cancer of prostate       PAST MEDICAL HISTORY: Past Medical History:  Diagnosis Date  . Cancer of prostate ([place]) 12/09/2014  . Neuromuscular disorder ([place])    brain tumor at 55 years of age  . Stroke ([place])    at 55 years of age    PAST SURGICAL HISTORY: History reviewed. No pertinent surgical history.  FAMILY HISTORY: Reviewed and unchanged. No reported history of malignancy or chronic disease.  GYNECOLOGIC HISTORY:  No LMP for male patient.     ADVANCED DIRECTIVES:    HEALTH MAINTENANCE: Social History  Substance Use Topics  . Smoking status: Current Every Day Smoker    Packs/day: 0.50    Types: Cigarettes  . Smokeless tobacco: Never Used  . Alcohol use No      No Known Allergies  Current Outpatient Prescriptions  Medication Sig Dispense Refill  . abiraterone Acetate (ZYTIGA) 250 MG tablet Take 4 tablets (1,000 mg total) by mouth daily. 120 tablet 3  . omeprazole (PRILOSEC) 20 MG capsule Take 1 capsule (20 mg total) by mouth daily. (Patient not taking: Reported on 04/09/2016) 30 capsule 3  . potassium chloride (KLOR-CON) 20 MEQ packet Take 20 mEq by mouth daily. 30 packet 3  . predniSONE (DELTASONE) 5 MG tablet Take 1 tablet (5 mg total) by mouth  daily with breakfast. (Patient not taking: Reported on 04/09/2016) 30 tablet 3  . tamsulosin (FLOMAX) 0.4 MG CAPS capsule Take 1 capsule (0.4 mg total) by mouth daily after breakfast. (Patient not taking: Reported on 04/09/2016) 30 capsule 3   No current facility-administered medications for this visit.     OBJECTIVE: BP (!) 180/117 (BP Location: Right Arm, Patient Position: Sitting)   Pulse 88    Temp 98 F (36.7 C) (Tympanic)   Wt 159 lb 9.8 oz (72.4 kg)   BMI 25.00 kg/m    Body mass index is 25 kg/m.    ECOG FS:0 - Asymptomatic  General: Well-developed, well-nourished, no acute distress. Lungs: Clear to auscultation bilaterally. Heart: Regular rate and rhythm. No rubs, murmurs, or gallops. Abdomen: Soft, nontender, nondistended. No organomegaly noted, normoactive bowel sounds. Musculoskeletal: No edema, cyanosis, or clubbing. Neuro: Alert, answering all questions appropriately. Cranial nerves grossly intact. Skin: No rashes or petechiae noted. Psych: Normal affect. Lymphatics: No cervical, clavicular LAD.   LAB RESULTS:  Appointment on 04/09/2016  Component Date Value Ref Range Status  . WBC 04/09/2016 12.8* 3.8 - 10.6 K/uL Final  . RBC 04/09/2016 4.60  4.40 - 5.90 MIL/uL Final  . Hemoglobin 04/09/2016 15.1  13.0 - 18.0 g/dL Final  . HCT 04/09/2016 42.3  40.0 - 52.0 % Final  . MCV 04/09/2016 92.1  80.0 - 100.0 fL Final  . MCH 04/09/2016 32.8  26.0 - 34.0 pg Final  . MCHC 04/09/2016 35.7  32.0 - 36.0 g/dL Final  . RDW 04/09/2016 13.5  11.5 - 14.5 % Final  . Platelets 04/09/2016 218  150 - 440 K/uL Final  . Neutrophils Relative % 04/09/2016 70  % Final  . Neutro Abs 04/09/2016 8.9* 1.4 - 6.5 K/uL Final  . Lymphocytes Relative 04/09/2016 20  % Final  . Lymphs Abs 04/09/2016 2.5  1.0 - 3.6 K/uL Final  . Monocytes Relative 04/09/2016 8  % Final  . Monocytes Absolute 04/09/2016 1.0  0.2 - 1.0 K/uL Final  . Eosinophils Relative 04/09/2016 1  % Final  . Eosinophils Absolute 04/09/2016 0.1  0 - 0.7 K/uL Final  . Basophils Relative 04/09/2016 1  % Final  . Basophils Absolute 04/09/2016 0.1  0 - 0.1 K/uL Final  . Sodium 04/09/2016 137  135 - 145 mmol/L Final  . Potassium 04/09/2016 2.5* 3.5 - 5.1 mmol/L Final   Comment: RESULT REPEATED AND VERIFIED CRITICAL RESULT CALLED TO, READ BACK BY AND VERIFIED WITH: HAYLEY RHODE 1030AM 04/09/2016 PWB   . Chloride 04/09/2016 104   101 - 111 mmol/L Final  . CO2 04/09/2016 25  22 - 32 mmol/L Final  . Glucose, Bld 04/09/2016 119* 65 - 99 mg/dL Final  . BUN 04/09/2016 13  6 - 20 mg/dL Final  . Creatinine, Ser 04/09/2016 0.61  0.61 - 1.24 mg/dL Final  . Calcium 04/09/2016 9.0  8.9 - 10.3 mg/dL Final  . Total Protein 04/09/2016 7.3  6.5 - 8.1 g/dL Final  . Albumin 04/09/2016 4.5  3.5 - 5.0 g/dL Final  . AST 04/09/2016 18  15 - 41 U/L Final  . ALT 04/09/2016 12* 17 - 63 U/L Final  . Alkaline Phosphatase 04/09/2016 115  38 - 126 U/L Final  . Total Bilirubin 04/09/2016 0.7  0.3 - 1.2 mg/dL Final  . GFR calc non Af Amer 04/09/2016 >60  >60 mL/min Final  . GFR calc Af Amer 04/09/2016 >60  >60 mL/min Final   Comment: (NOTE) The eGFR has been  calculated using the CKD EPI equation. This calculation has not been validated in all clinical situations. eGFR's persistently <60 mL/min signify possible Chronic Kidney Disease.   . Anion gap 04/09/2016 8  5 - 15 Final  . PSA 04/09/2016 0.47  0.00 - 4.00 ng/mL Final   Comment: (NOTE) While PSA levels of <=4.0 ng/ml are reported as reference range, some men with levels below 4.0 ng/ml can have prostate cancer and many men with PSA above 4.0 ng/ml do not have prostate cancer.  Other tests such as free PSA, age specific reference ranges, PSA velocity and PSA doubling time may be helpful especially in men less than 90 years old. Performed at [place]: No results found.  ASSESSMENT:  Stage IV prostate cancer Gleason grade 8 with metastases to liver and bone.  PLAN:    1. Stage IV prostate cancer Gleason grade 8 with metastases to liver and bone: CT scan results as well as bone scan results reviewed independently with essentially stable disease. Patient has previously refused any antiestrogen therapy, Lupron, or chemotherapy with Taxotere. Patient also refused any radiation therapy or xgeva. He is currently on Zytiga and tolerating It well, but admits to only  taking half as prescribed dose. He first understanding that this puts him at risk of progression of disease. He also only takes prednisone intermittently rather than as directed. PSA is slightly trending up and is now 0.47. Continue to monitor closely. Return to clinic in 3 months with repeat laboratory work and further evaluation. 2. Bony lesions: Patient is refusing Xgeva or Zometa. 3. Hypokalemia: Patient's potassium remains persistently low. He refuses to take oral supplementation by pill, but has agreed to try potassium chloride powder. 4. Urinary frequency: Likely secondary to enlarged prostate. Patient has Flomax listed in his medications, but is unclear his complaints.  Patient expressed understanding and was in agreement with this plan. He also understands that He can call clinic at any time with any questions, concerns, or complaints.    Cancer of prostate [facility])   Staging form: Prostate, AJCC 7th Edition     Clinical: Stage IV (T1c, N0, M1, PSA: Less than 10, Gleason 8-10 - Poorly differentiated/undifferentiated (marked anaplasia)) - Signed by Forest Gleason, MD on 12/09/2014   [name], MD   04/09/2016 3:02 PM

## 2016-04-09 ENCOUNTER — Inpatient Hospital Stay (HOSPITAL_BASED_OUTPATIENT_CLINIC_OR_DEPARTMENT_OTHER): Payer: Medicare Other | Admitting: Oncology

## 2016-04-09 ENCOUNTER — Encounter: Payer: Self-pay | Admitting: Oncology

## 2016-04-09 ENCOUNTER — Inpatient Hospital Stay: Payer: Medicare Other | Attending: Oncology

## 2016-04-09 VITALS — BP 180/117 | HR 88 | Temp 98.0°F | Wt 159.6 lb

## 2016-04-09 DIAGNOSIS — Z85841 Personal history of malignant neoplasm of brain: Secondary | ICD-10-CM | POA: Insufficient documentation

## 2016-04-09 DIAGNOSIS — C787 Secondary malignant neoplasm of liver and intrahepatic bile duct: Secondary | ICD-10-CM | POA: Insufficient documentation

## 2016-04-09 DIAGNOSIS — Z87891 Personal history of nicotine dependence: Secondary | ICD-10-CM | POA: Diagnosis not present

## 2016-04-09 DIAGNOSIS — Z8673 Personal history of transient ischemic attack (TIA), and cerebral infarction without residual deficits: Secondary | ICD-10-CM | POA: Insufficient documentation

## 2016-04-09 DIAGNOSIS — Z79899 Other long term (current) drug therapy: Secondary | ICD-10-CM

## 2016-04-09 DIAGNOSIS — C61 Malignant neoplasm of prostate: Secondary | ICD-10-CM | POA: Diagnosis not present

## 2016-04-09 DIAGNOSIS — R350 Frequency of micturition: Secondary | ICD-10-CM

## 2016-04-09 DIAGNOSIS — E876 Hypokalemia: Secondary | ICD-10-CM | POA: Insufficient documentation

## 2016-04-09 DIAGNOSIS — C7951 Secondary malignant neoplasm of bone: Secondary | ICD-10-CM

## 2016-04-09 LAB — COMPREHENSIVE METABOLIC PANEL
ALBUMIN: 4.5 g/dL (ref 3.5–5.0)
ALT: 12 U/L — ABNORMAL LOW (ref 17–63)
ANION GAP: 8 (ref 5–15)
AST: 18 U/L (ref 15–41)
Alkaline Phosphatase: 115 U/L (ref 38–126)
BILIRUBIN TOTAL: 0.7 mg/dL (ref 0.3–1.2)
BUN: 13 mg/dL (ref 6–20)
CO2: 25 mmol/L (ref 22–32)
Calcium: 9 mg/dL (ref 8.9–10.3)
Chloride: 104 mmol/L (ref 101–111)
Creatinine, Ser: 0.61 mg/dL (ref 0.61–1.24)
GFR calc Af Amer: >60 mL/min (ref >60)
GFR calc non Af Amer: >60 mL/min (ref >60)
GLUCOSE: 119 mg/dL — AB (ref 65–99)
POTASSIUM: 2.5 mmol/L — AB (ref 3.5–5.1)
SODIUM: 137 mmol/L (ref 135–145)
TOTAL PROTEIN: 7.3 g/dL (ref 6.5–8.1)

## 2016-04-09 LAB — CBC WITH DIFFERENTIAL/PLATELET
BASOS PCT: 1 %
Basophils Absolute: 0.1 10*3/uL (ref 0–0.1)
EOS ABS: 0.1 10*3/uL (ref 0–0.7)
EOS PCT: 1 %
HCT: 42.3 % (ref 40.0–52.0)
Hemoglobin: 15.1 g/dL (ref 13.0–18.0)
Lymphocytes Relative: 20 %
Lymphs Abs: 2.5 10*3/uL (ref 1.0–3.6)
MCH: 32.8 pg (ref 26.0–34.0)
MCHC: 35.7 g/dL (ref 32.0–36.0)
MCV: 92.1 fL (ref 80.0–100.0)
MONO ABS: 1 10*3/uL (ref 0.2–1.0)
MONOS PCT: 8 %
Neutro Abs: 8.9 10*3/uL — ABNORMAL HIGH (ref 1.4–6.5)
Neutrophils Relative %: 70 %
Platelets: 218 10*3/uL (ref 150–440)
RBC: 4.6 MIL/uL (ref 4.40–5.90)
RDW: 13.5 % (ref 11.5–14.5)
WBC: 12.8 10*3/uL — ABNORMAL HIGH (ref 3.8–10.6)

## 2016-04-09 LAB — PSA: PSA: 0.47 ng/mL (ref 0.00–4.00)

## 2016-04-09 MED ORDER — ABIRATERONE ACETATE 250 MG PO TABS: 1000.0000 mg | ORAL_TABLET | Freq: Every day | ORAL | 3 refills | Status: DC

## 2016-04-09 MED ORDER — POTASSIUM CHLORIDE 20 MEQ PO PACK: 20.0000 meq | PACK | Freq: Every day | ORAL | 3 refills | Status: DC

## 2016-04-11 ENCOUNTER — Telehealth: Payer: Self-pay | Admitting: *Deleted

## 2016-04-11 NOTE — Telephone Encounter (Signed)
Called pt to inform him that [facility] has received his prescription for Zytiga and it is currently being processed through his insurance. Pt instructed to call back if has further questions.

## 2016-04-17 ENCOUNTER — Telehealth: Payer: Self-pay | Admitting: *Deleted

## 2016-04-17 MED ORDER — POTASSIUM CHLORIDE CRYS ER 20 MEQ PO TBCR: 20.0000 meq | EXTENDED_RELEASE_TABLET | Freq: Every day | ORAL | 3 refills | Status: DC

## 2016-04-17 NOTE — Telephone Encounter (Signed)
New rx for potassium chloride tablets sent to pharmacy.

## 2016-04-17 NOTE — Telephone Encounter (Signed)
Patient cannot afford Klor con packets. Requesting order for tablets

## 2016-04-30 ENCOUNTER — Inpatient Hospital Stay: Payer: Medicare Other | Attending: Oncology | Admitting: Oncology

## 2016-04-30 VITALS — BP 167/103 | HR 73 | Temp 95.8°F | Resp 18 | Wt 159.1 lb

## 2016-04-30 DIAGNOSIS — N6341 Unspecified lump in right breast, subareolar: Secondary | ICD-10-CM

## 2016-04-30 DIAGNOSIS — C61 Malignant neoplasm of prostate: Secondary | ICD-10-CM | POA: Diagnosis not present

## 2016-04-30 DIAGNOSIS — C7951 Secondary malignant neoplasm of bone: Secondary | ICD-10-CM

## 2016-04-30 DIAGNOSIS — N63 Unspecified lump in unspecified breast: Secondary | ICD-10-CM

## 2016-04-30 DIAGNOSIS — E876 Hypokalemia: Secondary | ICD-10-CM | POA: Diagnosis not present

## 2016-04-30 DIAGNOSIS — R351 Nocturia: Secondary | ICD-10-CM

## 2016-04-30 DIAGNOSIS — Z7951 Long term (current) use of inhaled steroids: Secondary | ICD-10-CM | POA: Diagnosis not present

## 2016-04-30 DIAGNOSIS — C787 Secondary malignant neoplasm of liver and intrahepatic bile duct: Secondary | ICD-10-CM

## 2016-04-30 DIAGNOSIS — Z8673 Personal history of transient ischemic attack (TIA), and cerebral infarction without residual deficits: Secondary | ICD-10-CM | POA: Diagnosis not present

## 2016-04-30 DIAGNOSIS — Z79899 Other long term (current) drug therapy: Secondary | ICD-10-CM

## 2016-04-30 DIAGNOSIS — F17210 Nicotine dependence, cigarettes, uncomplicated: Secondary | ICD-10-CM | POA: Diagnosis not present

## 2016-04-30 NOTE — Progress Notes (Signed)
States has knot to right upper chest that is concerning for patient. Has had knot for the past 2 weeks and would like MD to evaluate it. Is taking zytiga as prescribed but did not take potassium tablets. States is only drinking orange juice at this time.

## 2016-05-01 ENCOUNTER — Ambulatory Visit: Payer: Medicare Other | Admitting: Oncology

## 2016-05-02 NOTE — Progress Notes (Signed)
[name]  Telephone:(336) [phone]  Fax:(336) [name] DOB: 13-Nov-1960  MR#: [id number]  PFX#:[id number]  Patient Care Team: Roger Gleason, MD as PCP - General (Oncology) Clent Jacks, RN as Registered Nurse  CHIEF COMPLAINT:  Stage IV prostate cancer Sparks grade 8 with metastases to liver and bone.  INTERVAL HISTORY:  Patient returns to clinic today as an add-on with complaints of a new mass underneath his right nipple. Patient states it appeared suddenly approximately 2 weeks ago and has gotten slightly larger in the interim. He otherwise feels well and is asymptomatic. He now is taking the recommended for tabs Zytiga daily as well as his prednisone. He continues to refuse any other interventions. He continues to heavily drink alcohol. He has no neurologic complaints. He denies any pain. He has a good appetite and denies weight loss. He denies any recent fevers or illnesses. He denies any nausea, vomiting, constipation, or diarrhea. He does admit to urinary frequency especially at night, but no other urinary complaints. Patient otherwise feels well and offers no further specific complaints.   REVIEW OF SYSTEMS:   Review of Systems  Constitutional: Negative.  Negative for fever, malaise/fatigue and weight loss.  Respiratory: Negative.  Negative for cough.   Cardiovascular: Negative.  Negative for chest pain and leg swelling.  Gastrointestinal: Negative.  Negative for abdominal pain.  Genitourinary: Positive for frequency.  Musculoskeletal: Negative.   Neurological: Negative.  Negative for weakness.  Psychiatric/Behavioral: Negative.  The patient is not nervous/anxious.     As per HPI. Otherwise, a complete review of systems is negative.  ONCOLOGY HISTORY: Oncology History   Carcinoma prostate adenocarcinoma in multiple fragments.  Sparks grade 8.  Involving neutral space.  Large duct and S cylinder-type.  Poorly differentiated.  Lateral  neck involvement.Marland Kitchen PSAs low 2.4.  CT scan of abdomen was abnormal with multiple liver lesions.  Diagnosis in April of 2016 Clinical staging T1 cN0 M1 stage IV disease 2, CT-guided liver biopsies consistent with metastases from the prostate cancer   3.  Patient refused any chemotherapy or Lupron Started on ZYTIGA and prednisone from May of 2016     Prostate cancer metastatic to bone [facility])   12/09/2014 Initial Diagnosis    Cancer of prostate       PAST MEDICAL HISTORY: Past Medical History:  Diagnosis Date  . Cancer of prostate ([place]) 12/09/2014  . Neuromuscular disorder ([place])    brain tumor at 55 years of age  . Stroke ([place])    at 55 years of age    PAST SURGICAL HISTORY: No past surgical history on file.  FAMILY HISTORY: Reviewed and unchanged. No reported history of malignancy or chronic disease.  GYNECOLOGIC HISTORY:  No LMP for male patient.     ADVANCED DIRECTIVES:    HEALTH MAINTENANCE: Social History  Substance Use Topics  . Smoking status: Current Every Day Smoker    Packs/day: 0.50    Types: Cigarettes  . Smokeless tobacco: Never Used  . Alcohol use No      No Known Allergies  Current Outpatient Prescriptions  Medication Sig Dispense Refill  . abiraterone Acetate (ZYTIGA) 250 MG tablet Take 4 tablets (1,000 mg total) by mouth daily. 120 tablet 3  . predniSONE (DELTASONE) 5 MG tablet Take 1 tablet (5 mg total) by mouth daily with breakfast. 30 tablet 3  . potassium chloride SA (K-DUR,KLOR-CON) 20 MEQ tablet Take 1 tablet (20 mEq total) by mouth daily. (  Patient not taking: Reported on 04/30/2016) 30 tablet 3   No current facility-administered medications for this visit.     OBJECTIVE: BP (!) 167/103 (BP Location: Left Arm, Patient Position: Sitting)   Pulse 73   Temp (!) 95.8 F (35.4 C) (Tympanic)   Resp 18   Wt 159 lb 1 oz (72.1 kg)   BMI 24.91 kg/m    Body mass index is 24.91 kg/m.    ECOG FS:0 - Asymptomatic  General: Well-developed,  well-nourished, no acute distress. Lungs: Clear to auscultation bilaterally. Heart: Regular rate and rhythm. No rubs, murmurs, or gallops. Breasts: Right breast with approximately 1-2 cm mass centrally under the nipple. Abdomen: Soft, nontender, nondistended. No organomegaly noted, normoactive bowel sounds. Musculoskeletal: No edema, cyanosis, or clubbing. Neuro: Alert, answering all questions appropriately. Cranial nerves grossly intact. Skin: No rashes or petechiae noted. Psych: Normal affect. Lymphatics: No cervical, clavicular LAD.   LAB RESULTS:  No visits with results within 3 Day(s) from this visit.  Latest known visit with results is:  Appointment on 04/09/2016  Component Date Value Ref Range Status  . WBC 04/09/2016 12.8* 3.8 - 10.6 K/uL Final  . RBC 04/09/2016 4.60  4.40 - 5.90 MIL/uL Final  . Hemoglobin 04/09/2016 15.1  13.0 - 18.0 g/dL Final  . HCT 04/09/2016 42.3  40.0 - 52.0 % Final  . MCV 04/09/2016 92.1  80.0 - 100.0 fL Final  . MCH 04/09/2016 32.8  26.0 - 34.0 pg Final  . MCHC 04/09/2016 35.7  32.0 - 36.0 g/dL Final  . RDW 04/09/2016 13.5  11.5 - 14.5 % Final  . Platelets 04/09/2016 218  150 - 440 K/uL Final  . Neutrophils Relative % 04/09/2016 70  % Final  . Neutro Abs 04/09/2016 8.9* 1.4 - 6.5 K/uL Final  . Lymphocytes Relative 04/09/2016 20  % Final  . Lymphs Abs 04/09/2016 2.5  1.0 - 3.6 K/uL Final  . Monocytes Relative 04/09/2016 8  % Final  . Monocytes Absolute 04/09/2016 1.0  0.2 - 1.0 K/uL Final  . Eosinophils Relative 04/09/2016 1  % Final  . Eosinophils Absolute 04/09/2016 0.1  0 - 0.7 K/uL Final  . Basophils Relative 04/09/2016 1  % Final  . Basophils Absolute 04/09/2016 0.1  0 - 0.1 K/uL Final  . Sodium 04/09/2016 137  135 - 145 mmol/L Final  . Potassium 04/09/2016 2.5* 3.5 - 5.1 mmol/L Final   Comment: RESULT REPEATED AND VERIFIED CRITICAL RESULT CALLED TO, READ BACK BY AND VERIFIED WITH: HAYLEY RHODE 1030AM 04/09/2016 PWB   . Chloride  04/09/2016 104  101 - 111 mmol/L Final  . CO2 04/09/2016 25  22 - 32 mmol/L Final  . Glucose, Bld 04/09/2016 119* 65 - 99 mg/dL Final  . BUN 04/09/2016 13  6 - 20 mg/dL Final  . Creatinine, Ser 04/09/2016 0.61  0.61 - 1.24 mg/dL Final  . Calcium 04/09/2016 9.0  8.9 - 10.3 mg/dL Final  . Total Protein 04/09/2016 7.3  6.5 - 8.1 g/dL Final  . Albumin 04/09/2016 4.5  3.5 - 5.0 g/dL Final  . AST 04/09/2016 18  15 - 41 U/L Final  . ALT 04/09/2016 12* 17 - 63 U/L Final  . Alkaline Phosphatase 04/09/2016 115  38 - 126 U/L Final  . Total Bilirubin 04/09/2016 0.7  0.3 - 1.2 mg/dL Final  . GFR calc non Af Amer 04/09/2016 >60  >60 mL/min Final  . GFR calc Af Amer 04/09/2016 >60  >60 mL/min Final   Comment: (NOTE)  The eGFR has been calculated using the CKD EPI equation. This calculation has not been validated in all clinical situations. eGFR's persistently <60 mL/min signify possible Chronic Kidney Disease.   . Anion gap 04/09/2016 8  5 - 15 Final  . PSA 04/09/2016 0.47  0.00 - 4.00 ng/mL Final   Comment: (NOTE) While PSA levels of <=4.0 ng/ml are reported as reference range, some men with levels below 4.0 ng/ml can have prostate cancer and many men with PSA above 4.0 ng/ml do not have prostate cancer.  Other tests such as free PSA, age specific reference ranges, PSA velocity and PSA doubling time may be helpful especially in men less than 56 years old. Performed at [place]: No results found.  ASSESSMENT:  Stage IV prostate cancer Sparks grade 8 with metastases to liver and bone.  PLAN:    1. Stage IV prostate cancer Sparks grade 8 with metastases to liver and bone: CT scan results as well as bone scan results reviewed independently with essentially stable disease. Patient has previously refused any antiestrogen therapy, Lupron, or chemotherapy with Taxotere. Patient also refused any radiation therapy or xgeva. He is currently on Zytiga and taking the correct  dose. He states he is also taking his prednisone as prescribed. PSA is slightly trending up and is now 0.47. Continue to monitor closely. Return to clinic as previously scheduled in 3 months with repeat laboratory work and further evaluation. 2. Bony lesions: Patient is refusing Xgeva or Zometa. 3. Hypokalemia: Patient's potassium remains persistently low. He refuses to take oral supplementation by pill, but has agreed to try potassium chloride powder. 4. Urinary frequency: Likely secondary to enlarged prostate. Patient has Flomax listed in his medications, but is unclear his complaints. 5. Breast mass: We will get ultrasound guided biopsy for further evaluation. Possibly gynecomastia secondary to Zytiga.  Patient expressed understanding and was in agreement with this plan. He also understands that He can call clinic at any time with any questions, concerns, or complaints.    Cancer of prostate [facility])   Staging form: Prostate, AJCC 7th Edition     Clinical: Stage IV (T1c, N0, M1, PSA: Less than 10, Sparks 8-10 - Poorly differentiated/undifferentiated (marked anaplasia)) - Signed by Roger Gleason, MD on 12/09/2014   [name], MD   05/02/2016 1:04 PM

## 2016-05-16 ENCOUNTER — Ambulatory Visit: Payer: Medicare Other

## 2016-05-16 ENCOUNTER — Other Ambulatory Visit: Payer: Medicare Other

## 2016-06-03 ENCOUNTER — Ambulatory Visit: Payer: Medicare Other | Attending: Oncology

## 2016-06-03 ENCOUNTER — Other Ambulatory Visit: Payer: Medicare Other

## 2016-07-14 NOTE — Progress Notes (Deleted)
Roger Sparks  [phone]) [phone]  Fax:([phone]) [place] DOB: 1961/01/05  MR#: [id number]  CBJ#:[id number]  Patient Care Team: Forest Gleason, MD as PCP - General (Oncology) Clent Jacks, RN as Registered Nurse  CHIEF COMPLAINT:  Stage IV prostate cancer Gleason grade 8 with metastases to liver and bone.  INTERVAL HISTORY:  Patient returns to clinic today as an add-on with complaints of a new mass underneath his right nipple. Patient states it appeared suddenly approximately 2 weeks ago and has gotten slightly larger in the interim. He otherwise feels well and is asymptomatic. He now is taking the recommended for tabs Zytiga daily as well as his prednisone. He continues to refuse any other interventions. He continues to heavily drink alcohol. He has no neurologic complaints. He denies any pain. He has a good appetite and denies weight loss. He denies any recent fevers or illnesses. He denies any nausea, vomiting, constipation, or diarrhea. He does admit to urinary frequency especially at night, but no other urinary complaints. Patient otherwise feels well and offers no further specific complaints.   REVIEW OF SYSTEMS:   Review of Systems  Constitutional: Negative.  Negative for fever, malaise/fatigue and weight loss.  Respiratory: Negative.  Negative for cough.   Cardiovascular: Negative.  Negative for chest pain and leg swelling.  Gastrointestinal: Negative.  Negative for abdominal pain.  Genitourinary: Positive for frequency.  Musculoskeletal: Negative.   Neurological: Negative.  Negative for weakness.  Psychiatric/Behavioral: Negative.  The patient is not nervous/anxious.     As per HPI. Otherwise, a complete review of systems is negative.  ONCOLOGY HISTORY: Oncology History   Carcinoma prostate adenocarcinoma in multiple fragments.  Gleason grade 8.  Involving neutral space.  Large duct and S cylinder-type.  Poorly differentiated.  Lateral  neck involvement.Marland Kitchen PSAs low 2.4.  CT scan of abdomen was abnormal with multiple liver lesions.  Diagnosis in April of 2016 Clinical staging T1 cN0 M1 stage IV disease 2, CT-guided liver biopsies consistent with metastases from the prostate cancer   3.  Patient refused any chemotherapy or Lupron Started on ZYTIGA and prednisone from May of 2016     Prostate cancer metastatic to bone [facility])   12/09/2014 Initial Diagnosis    Cancer of prostate       PAST MEDICAL HISTORY: Past Medical History:  Diagnosis Date  . Cancer of prostate ([place]) 12/09/2014  . Neuromuscular disorder ([place])    brain tumor at 55 years of age  . Stroke ([place])    at 54 years of age    PAST SURGICAL HISTORY: No past surgical history on file.  FAMILY HISTORY: Reviewed and unchanged. No reported history of malignancy or chronic disease.  GYNECOLOGIC HISTORY:  No LMP for male patient.     ADVANCED DIRECTIVES:    HEALTH MAINTENANCE: Social History  Substance Use Topics  . Smoking status: Current Every Day Smoker    Packs/day: 0.50    Types: Cigarettes  . Smokeless tobacco: Never Used  . Alcohol use No      No Known Allergies  Current Outpatient Prescriptions  Medication Sig Dispense Refill  . abiraterone Acetate (ZYTIGA) 250 MG tablet Take 4 tablets (1,000 mg total) by mouth daily. 120 tablet 3  . potassium chloride SA (K-DUR,KLOR-CON) 20 MEQ tablet Take 1 tablet (20 mEq total) by mouth daily. (Patient not taking: Reported on 04/30/2016) 30 tablet 3  . predniSONE (DELTASONE) 5 MG tablet Take 1 tablet (5 mg  total) by mouth daily with breakfast. 30 tablet 3   No current facility-administered medications for this visit.     OBJECTIVE: There were no vitals taken for this visit.   There is no height or weight on file to calculate BMI.    ECOG FS:0 - Asymptomatic  General: Well-developed, well-nourished, no acute distress. Lungs: Clear to auscultation bilaterally. Heart: Regular rate and rhythm. No rubs,  murmurs, or gallops. Breasts: Right breast with approximately 1-2 cm mass centrally under the nipple. Abdomen: Soft, nontender, nondistended. No organomegaly noted, normoactive bowel sounds. Musculoskeletal: No edema, cyanosis, or clubbing. Neuro: Alert, answering all questions appropriately. Cranial nerves grossly intact. Skin: No rashes or petechiae noted. Psych: Normal affect. Lymphatics: No cervical, clavicular LAD.   LAB RESULTS:  No visits with results within 3 Day(s) from this visit.  Latest known visit with results is:  Appointment on 04/09/2016  Component Date Value Ref Range Status  . WBC 04/09/2016 12.8* 3.8 - 10.6 K/uL Final  . RBC 04/09/2016 4.60  4.40 - 5.90 MIL/uL Final  . Hemoglobin 04/09/2016 15.1  13.0 - 18.0 g/dL Final  . HCT 04/09/2016 42.3  40.0 - 52.0 % Final  . MCV 04/09/2016 92.1  80.0 - 100.0 fL Final  . MCH 04/09/2016 32.8  26.0 - 34.0 pg Final  . MCHC 04/09/2016 35.7  32.0 - 36.0 g/dL Final  . RDW 04/09/2016 13.5  11.5 - 14.5 % Final  . Platelets 04/09/2016 218  150 - 440 K/uL Final  . Neutrophils Relative % 04/09/2016 70  % Final  . Neutro Abs 04/09/2016 8.9* 1.4 - 6.5 K/uL Final  . Lymphocytes Relative 04/09/2016 20  % Final  . Lymphs Abs 04/09/2016 2.5  1.0 - 3.6 K/uL Final  . Monocytes Relative 04/09/2016 8  % Final  . Monocytes Absolute 04/09/2016 1.0  0.2 - 1.0 K/uL Final  . Eosinophils Relative 04/09/2016 1  % Final  . Eosinophils Absolute 04/09/2016 0.1  0 - 0.7 K/uL Final  . Basophils Relative 04/09/2016 1  % Final  . Basophils Absolute 04/09/2016 0.1  0 - 0.1 K/uL Final  . Sodium 04/09/2016 137  135 - 145 mmol/L Final  . Potassium 04/09/2016 2.5* 3.5 - 5.1 mmol/L Final   Comment: RESULT REPEATED AND VERIFIED CRITICAL RESULT CALLED TO, READ BACK BY AND VERIFIED WITH: HAYLEY RHODE 1030AM 04/09/2016 PWB   . Chloride 04/09/2016 104  101 - 111 mmol/L Final  . CO2 04/09/2016 25  22 - 32 mmol/L Final  . Glucose, Bld 04/09/2016 119* 65 - 99 mg/dL  Final  . BUN 04/09/2016 13  6 - 20 mg/dL Final  . Creatinine, Ser 04/09/2016 0.61  0.61 - 1.24 mg/dL Final  . Calcium 04/09/2016 9.0  8.9 - 10.3 mg/dL Final  . Total Protein 04/09/2016 7.3  6.5 - 8.1 g/dL Final  . Albumin 04/09/2016 4.5  3.5 - 5.0 g/dL Final  . AST 04/09/2016 18  15 - 41 U/L Final  . ALT 04/09/2016 12* 17 - 63 U/L Final  . Alkaline Phosphatase 04/09/2016 115  38 - 126 U/L Final  . Total Bilirubin 04/09/2016 0.7  0.3 - 1.2 mg/dL Final  . GFR calc non Af Amer 04/09/2016 >60  >60 mL/min Final  . GFR calc Af Amer 04/09/2016 >60  >60 mL/min Final   Comment: (NOTE) The eGFR has been calculated using the CKD EPI equation. This calculation has not been validated in all clinical situations. eGFR's persistently <60 mL/min signify possible Chronic Kidney Disease.   Marland Kitchen  Anion gap 04/09/2016 8  5 - 15 Final  . PSA 04/09/2016 0.47  0.00 - 4.00 ng/mL Final   Comment: (NOTE) While PSA levels of <=4.0 ng/ml are reported as reference range, some men with levels below 4.0 ng/ml can have prostate cancer and many men with PSA above 4.0 ng/ml do not have prostate cancer.  Other tests such as free PSA, age specific reference ranges, PSA velocity and PSA doubling time may be helpful especially in men less than 38 years old. Performed at [place]: No results found.  ASSESSMENT:  Stage IV prostate cancer Gleason grade 8 with metastases to liver and bone.  PLAN:    1. Stage IV prostate cancer Gleason grade 8 with metastases to liver and bone: CT scan results as well as bone scan results reviewed independently with essentially stable disease. Patient has previously refused any antiestrogen therapy, Lupron, or chemotherapy with Taxotere. Patient also refused any radiation therapy or xgeva. He is currently on Zytiga and taking the correct dose. He states he is also taking his prednisone as prescribed. PSA is slightly trending up and is now 0.47. Continue to monitor  closely. Return to clinic as previously scheduled in 3 months with repeat laboratory work and further evaluation. 2. Bony lesions: Patient is refusing Xgeva or Zometa. 3. Hypokalemia: Patient's potassium remains persistently low. He refuses to take oral supplementation by pill, but has agreed to try potassium chloride powder. 4. Urinary frequency: Likely secondary to enlarged prostate. Patient has Flomax listed in his medications, but is unclear his complaints. 5. Breast mass: We will get ultrasound guided biopsy for further evaluation. Possibly gynecomastia secondary to Zytiga.  Patient expressed understanding and was in agreement with this plan. He also understands that He can call clinic at any time with any questions, concerns, or complaints.    Cancer of prostate [facility])   Staging form: Prostate, AJCC 7th Edition     Clinical: Stage IV (T1c, N0, M1, PSA: Less than 10, Gleason 8-10 - Poorly differentiated/undifferentiated (marked anaplasia)) - Signed by Forest Gleason, MD on 12/09/2014   [name], MD   07/14/2016 10:23 PM

## 2016-07-16 ENCOUNTER — Inpatient Hospital Stay: Payer: Medicare Other | Admitting: Oncology

## 2016-07-16 ENCOUNTER — Inpatient Hospital Stay: Payer: Medicare Other

## 2016-08-17 NOTE — Progress Notes (Deleted)
[place]  Telephone:(336) [phone]  Fax:(336) [place] DOB: 03/15/1961  MR#: [id number]  ERD#:[id number]  Patient Care Team: Forest Gleason, MD as PCP - General (Oncology) Clent Jacks, RN as Registered Nurse  CHIEF COMPLAINT:  Stage IV prostate cancer Gleason grade 8 with metastases to liver and bone.  INTERVAL HISTORY:  Patient returns to clinic today as an add-on with complaints of a new mass underneath his right nipple. Patient states it appeared suddenly approximately 2 weeks ago and has gotten slightly larger in the interim. He otherwise feels well and is asymptomatic. He now is taking the recommended for tabs Zytiga daily as well as his prednisone. He continues to refuse any other interventions. He continues to heavily drink alcohol. He has no neurologic complaints. He denies any pain. He has a good appetite and denies weight loss. He denies any recent fevers or illnesses. He denies any nausea, vomiting, constipation, or diarrhea. He does admit to urinary frequency especially at night, but no other urinary complaints. Patient otherwise feels well and offers no further specific complaints.   REVIEW OF SYSTEMS:   Review of Systems  Constitutional: Negative.  Negative for fever, malaise/fatigue and weight loss.  Respiratory: Negative.  Negative for cough.   Cardiovascular: Negative.  Negative for chest pain and leg swelling.  Gastrointestinal: Negative.  Negative for abdominal pain.  Genitourinary: Positive for frequency.  Musculoskeletal: Negative.   Neurological: Negative.  Negative for weakness.  Psychiatric/Behavioral: Negative.  The patient is not nervous/anxious.     As per HPI. Otherwise, a complete review of systems is negative.  ONCOLOGY HISTORY: Oncology History   Carcinoma prostate adenocarcinoma in multiple fragments.  Gleason grade 8.  Involving neutral space.  Large duct and S cylinder-type.  Poorly differentiated.  Lateral  neck involvement.Marland Kitchen PSAs low 2.4.  CT scan of abdomen was abnormal with multiple liver lesions.  Diagnosis in April of 2016 Clinical staging T1 cN0 M1 stage IV disease 2, CT-guided liver biopsies consistent with metastases from the prostate cancer   3.  Patient refused any chemotherapy or Lupron Started on ZYTIGA and prednisone from May of 2016     Prostate cancer metastatic to bone [facility])   12/09/2014 Initial Diagnosis    Cancer of prostate       PAST MEDICAL HISTORY: Past Medical History:  Diagnosis Date  . Cancer of prostate ([place]) 12/09/2014  . Neuromuscular disorder ([place])    brain tumor at 56 years of age  . Stroke ([place])    at 56 years of age    PAST SURGICAL HISTORY: No past surgical history on file.  FAMILY HISTORY: Reviewed and unchanged. No reported history of malignancy or chronic disease.  GYNECOLOGIC HISTORY:  No LMP for male patient.     ADVANCED DIRECTIVES:    HEALTH MAINTENANCE: Social History  Substance Use Topics  . Smoking status: Current Every Day Smoker    Packs/day: 0.50    Types: Cigarettes  . Smokeless tobacco: Never Used  . Alcohol use No      No Known Allergies  Current Outpatient Prescriptions  Medication Sig Dispense Refill  . abiraterone Acetate (ZYTIGA) 250 MG tablet Take 4 tablets (1,000 mg total) by mouth daily. 120 tablet 3  . potassium chloride SA (K-DUR,KLOR-CON) 20 MEQ tablet Take 1 tablet (20 mEq total) by mouth daily. (Patient not taking: Reported on 04/30/2016) 30 tablet 3  . predniSONE (DELTASONE) 5 MG tablet Take 1 tablet (5 mg  total) by mouth daily with breakfast. 30 tablet 3   No current facility-administered medications for this visit.     OBJECTIVE: There were no vitals taken for this visit.   There is no height or weight on file to calculate BMI.    ECOG FS:0 - Asymptomatic  General: Well-developed, well-nourished, no acute distress. Lungs: Clear to auscultation bilaterally. Heart: Regular rate and rhythm. No rubs,  murmurs, or gallops. Breasts: Right breast with approximately 1-2 cm mass centrally under the nipple. Abdomen: Soft, nontender, nondistended. No organomegaly noted, normoactive bowel sounds. Musculoskeletal: No edema, cyanosis, or clubbing. Neuro: Alert, answering all questions appropriately. Cranial nerves grossly intact. Skin: No rashes or petechiae noted. Psych: Normal affect. Lymphatics: No cervical, clavicular LAD.   LAB RESULTS:  No visits with results within 3 Day(s) from this visit.  Latest known visit with results is:  Appointment on 04/09/2016  Component Date Value Ref Range Status  . WBC 04/09/2016 12.8* 3.8 - 10.6 K/uL Final  . RBC 04/09/2016 4.60  4.40 - 5.90 MIL/uL Final  . Hemoglobin 04/09/2016 15.1  13.0 - 18.0 g/dL Final  . HCT 04/09/2016 42.3  40.0 - 52.0 % Final  . MCV 04/09/2016 92.1  80.0 - 100.0 fL Final  . MCH 04/09/2016 32.8  26.0 - 34.0 pg Final  . MCHC 04/09/2016 35.7  32.0 - 36.0 g/dL Final  . RDW 04/09/2016 13.5  11.5 - 14.5 % Final  . Platelets 04/09/2016 218  150 - 440 K/uL Final  . Neutrophils Relative % 04/09/2016 70  % Final  . Neutro Abs 04/09/2016 8.9* 1.4 - 6.5 K/uL Final  . Lymphocytes Relative 04/09/2016 20  % Final  . Lymphs Abs 04/09/2016 2.5  1.0 - 3.6 K/uL Final  . Monocytes Relative 04/09/2016 8  % Final  . Monocytes Absolute 04/09/2016 1.0  0.2 - 1.0 K/uL Final  . Eosinophils Relative 04/09/2016 1  % Final  . Eosinophils Absolute 04/09/2016 0.1  0 - 0.7 K/uL Final  . Basophils Relative 04/09/2016 1  % Final  . Basophils Absolute 04/09/2016 0.1  0 - 0.1 K/uL Final  . Sodium 04/09/2016 137  135 - 145 mmol/L Final  . Potassium 04/09/2016 2.5* 3.5 - 5.1 mmol/L Final   Comment: RESULT REPEATED AND VERIFIED CRITICAL RESULT CALLED TO, READ BACK BY AND VERIFIED WITH: HAYLEY RHODE 1030AM 04/09/2016 PWB   . Chloride 04/09/2016 104  101 - 111 mmol/L Final  . CO2 04/09/2016 25  22 - 32 mmol/L Final  . Glucose, Bld 04/09/2016 119* 65 - 99 mg/dL  Final  . BUN 04/09/2016 13  6 - 20 mg/dL Final  . Creatinine, Ser 04/09/2016 0.61  0.61 - 1.24 mg/dL Final  . Calcium 04/09/2016 9.0  8.9 - 10.3 mg/dL Final  . Total Protein 04/09/2016 7.3  6.5 - 8.1 g/dL Final  . Albumin 04/09/2016 4.5  3.5 - 5.0 g/dL Final  . AST 04/09/2016 18  15 - 41 U/L Final  . ALT 04/09/2016 12* 17 - 63 U/L Final  . Alkaline Phosphatase 04/09/2016 115  38 - 126 U/L Final  . Total Bilirubin 04/09/2016 0.7  0.3 - 1.2 mg/dL Final  . GFR calc non Af Amer 04/09/2016 >60  >60 mL/min Final  . GFR calc Af Amer 04/09/2016 >60  >60 mL/min Final   Comment: (NOTE) The eGFR has been calculated using the CKD EPI equation. This calculation has not been validated in all clinical situations. eGFR's persistently <60 mL/min signify possible Chronic Kidney Disease.   Marland Kitchen  Anion gap 04/09/2016 8  5 - 15 Final  . PSA 04/09/2016 0.47  0.00 - 4.00 ng/mL Final   Comment: (NOTE) While PSA levels of <=4.0 ng/ml are reported as reference range, some men with levels below 4.0 ng/ml can have prostate cancer and many men with PSA above 4.0 ng/ml do not have prostate cancer.  Other tests such as free PSA, age specific reference ranges, PSA velocity and PSA doubling time may be helpful especially in men less than 64 years old. Performed at [place]: No results found.  ASSESSMENT:  Stage IV prostate cancer Gleason grade 8 with metastases to liver and bone.  PLAN:    1. Stage IV prostate cancer Gleason grade 8 with metastases to liver and bone: CT scan results as well as bone scan results reviewed independently with essentially stable disease. Patient has previously refused any antiestrogen therapy, Lupron, or chemotherapy with Taxotere. Patient also refused any radiation therapy or xgeva. He is currently on Zytiga and taking the correct dose. He states he is also taking his prednisone as prescribed. PSA is slightly trending up and is now 0.47. Continue to monitor  closely. Return to clinic as previously scheduled in 3 months with repeat laboratory work and further evaluation. 2. Bony lesions: Patient is refusing Xgeva or Zometa. 3. Hypokalemia: Patient's potassium remains persistently low. He refuses to take oral supplementation by pill, but has agreed to try potassium chloride powder. 4. Urinary frequency: Likely secondary to enlarged prostate. Patient has Flomax listed in his medications, but is unclear his complaints. 5. Breast mass: We will get ultrasound guided biopsy for further evaluation. Possibly gynecomastia secondary to Zytiga.  Patient expressed understanding and was in agreement with this plan. He also understands that He can call clinic at any time with any questions, concerns, or complaints.    Cancer of prostate [facility])   Staging form: Prostate, AJCC 7th Edition     Clinical: Stage IV (T1c, N0, M1, PSA: Less than 10, Gleason 8-10 - Poorly differentiated/undifferentiated (marked anaplasia)) - Signed by Forest Gleason, MD on 12/09/2014   [name], MD   08/17/2016 11:30 PM

## 2016-08-18 ENCOUNTER — Inpatient Hospital Stay: Payer: Medicare Other | Admitting: Oncology

## 2016-08-18 ENCOUNTER — Telehealth: Payer: Self-pay | Admitting: *Deleted

## 2016-08-18 ENCOUNTER — Inpatient Hospital Stay: Payer: Medicare Other

## 2016-08-18 NOTE — Telephone Encounter (Signed)
Called pt to inform him that he missed his appt and that we needed him to sign application forms to continue getting assistanct for zytiga. appt was rescheduled for patient and instructed pt to come to cancer center this afternoon to sign forms so his application can be submitted to [employer]. Pt verbalized understanding and stated will be here this afternoon.

## 2016-09-01 ENCOUNTER — Ambulatory Visit: Payer: Medicare Other | Admitting: Oncology

## 2016-09-01 ENCOUNTER — Other Ambulatory Visit: Payer: Medicare Other

## 2016-09-03 ENCOUNTER — Other Ambulatory Visit: Payer: Medicare Other

## 2016-09-03 ENCOUNTER — Ambulatory Visit: Payer: Medicare Other | Admitting: Oncology

## 2016-09-17 NOTE — Progress Notes (Signed)
Roger Sparks  [phone]) [phone]  Fax:(336) [place] DOB: Jan 14, 1961  MR#: [id number]  UYZ#:[id number]  Patient Care Team: Forest Gleason, MD as PCP - General (Oncology) Clent Jacks, RN as Registered Nurse  CHIEF COMPLAINT:  Stage IV prostate cancer Gleason grade 8 with metastases to liver and bone.  INTERVAL HISTORY:  Patient returns to clinic today for routine evaluation and laboratory work. He continues to feel well and and is asymptomatic. He continues to tolerate Zytiga well. He admits to only taking prednisone occasionally. He continues to refuse any other interventions. He continues to heavily drink alcohol. He has no neurologic complaints. He denies any pain. He has a good appetite and denies weight loss. He denies any recent fevers or illnesses. He denies any nausea, vomiting, constipation, or diarrhea. He does admit to urinary frequency especially at night, but no other urinary complaints. Patient otherwise feels well and offers no further specific complaints.   REVIEW OF SYSTEMS:   Review of Systems  Constitutional: Negative.  Negative for fever, malaise/fatigue and weight loss.  Respiratory: Negative.  Negative for cough.   Cardiovascular: Negative.  Negative for chest pain and leg swelling.  Gastrointestinal: Negative.  Negative for abdominal pain.  Genitourinary: Positive for frequency.  Musculoskeletal: Negative.   Neurological: Negative.  Negative for weakness.  Psychiatric/Behavioral: Negative.  The patient is not nervous/anxious.     As per HPI. Otherwise, a complete review of systems is negative.  ONCOLOGY HISTORY: Oncology History   Carcinoma prostate adenocarcinoma in multiple fragments.  Gleason grade 8.  Involving neutral space.  Large duct and S cylinder-type.  Poorly differentiated.  Lateral neck involvement.Marland Kitchen PSAs low 2.4.  CT scan of abdomen was abnormal with multiple liver lesions.  Diagnosis in April of  2016 Clinical staging T1 cN0 M1 stage IV disease 2, CT-guided liver biopsies consistent with metastases from the prostate cancer   3.  Patient refused any chemotherapy or Lupron Started on ZYTIGA and prednisone from May of 2016     Prostate cancer metastatic to bone [facility])   12/09/2014 Initial Diagnosis    Cancer of prostate       PAST MEDICAL HISTORY: Past Medical History:  Diagnosis Date  . Cancer of prostate ([place]) 12/09/2014  . Neuromuscular disorder ([place])    brain tumor at 56 years of age  . Stroke ([place])    at 56 years of age    PAST SURGICAL HISTORY: History reviewed. No pertinent surgical history.  FAMILY HISTORY: Reviewed and unchanged. No reported history of malignancy or chronic disease.  GYNECOLOGIC HISTORY:  No LMP for male patient.     ADVANCED DIRECTIVES:    HEALTH MAINTENANCE: Social History  Substance Use Topics  . Smoking status: Current Every Day Smoker    Packs/day: 0.50    Types: Cigarettes  . Smokeless tobacco: Never Used  . Alcohol use No      No Known Allergies  Current Outpatient Prescriptions  Medication Sig Dispense Refill  . abiraterone Acetate (ZYTIGA) 250 MG tablet Take 4 tablets (1,000 mg total) by mouth daily. 120 tablet 3  . potassium chloride SA (K-DUR,KLOR-CON) 20 MEQ tablet Take 1 tablet (20 mEq total) by mouth daily. (Patient not taking: Reported on 09/18/2016) 30 tablet 3   No current facility-administered medications for this visit.     OBJECTIVE: BP (!) 163/101 (BP Location: Left Arm, Patient Position: Sitting)   Pulse 74   Temp 98.3 F (36.8 C) (Tympanic)  Wt 157 lb 6.5 oz (71.4 kg)   SpO2 94%   BMI 24.65 kg/m    Body mass index is 24.65 kg/m.    ECOG FS:0 - Asymptomatic  General: Well-developed, well-nourished, no acute distress. Lungs: Clear to auscultation bilaterally. Heart: Regular rate and rhythm. No rubs, murmurs, or gallops. Breasts: Right breast with approximately 1-2 cm mass centrally under the  nipple. Abdomen: Soft, nontender, nondistended. No organomegaly noted, normoactive bowel sounds. Musculoskeletal: No edema, cyanosis, or clubbing. Neuro: Alert, answering all questions appropriately. Cranial nerves grossly intact. Skin: No rashes or petechiae noted. Psych: Normal affect. Lymphatics: No cervical, clavicular LAD.   LAB RESULTS:  Appointment on 09/18/2016  Component Date Value Ref Range Status  . WBC 09/18/2016 9.8  3.8 - 10.6 K/uL Final  . RBC 09/18/2016 4.60  4.40 - 5.90 MIL/uL Final  . Hemoglobin 09/18/2016 15.0  13.0 - 17.0 g/dL Final  . HCT 09/18/2016 41.8  39.0 - 52.0 % Final  . MCV 09/18/2016 90.9  80.0 - 100.0 fL Final  . MCH 09/18/2016 32.6  26.0 - 34.0 pg Final  . MCHC 09/18/2016 35.9  32.0 - 36.0 g/dL Final  . RDW 09/18/2016 13.3  11.5 - 14.5 % Final  . Platelets 09/18/2016 224  150 - 440 K/uL Final  . Neutrophils Relative % 09/18/2016 67  % Final  . Neutro Abs 09/18/2016 6.6* 1.4 - 6.5 K/uL Final  . Lymphocytes Relative 09/18/2016 22  % Final  . Lymphs Abs 09/18/2016 2.2  1.0 - 3.6 K/uL Final  . Monocytes Relative 09/18/2016 9  % Final  . Monocytes Absolute 09/18/2016 0.9  0.2 - 1.0 K/uL Final  . Eosinophils Relative 09/18/2016 1  % Final  . Eosinophils Absolute 09/18/2016 0.1  0 - 0.7 K/uL Final  . Basophils Relative 09/18/2016 1  % Final  . Basophils Absolute 09/18/2016 0.1  0 - 0.1 K/uL Final  . Sodium 09/18/2016 138  135 - 145 mmol/L Final  . Potassium 09/18/2016 2.4* 3.5 - 5.1 mmol/L Final   Comment: RESULTS VERIFIED BY REPEAT TESTING CRITICAL RESULT CALLED TO, READ BACK BY AND VERIFIED WITH BRITTANY PEARCE @0950  09/18/2016 LGR   . Chloride 09/18/2016 103  101 - 111 mmol/L Final  . CO2 09/18/2016 29  22 - 32 mmol/L Final  . Glucose, Bld 09/18/2016 88  65 - 99 mg/dL Final  . BUN 09/18/2016 14  6 - 20 mg/dL Final  . Creatinine, Ser 09/18/2016 0.60* 0.61 - 1.24 mg/dL Final  . Calcium 09/18/2016 9.2  8.9 - 10.3 mg/dL Final  . Total Protein  09/18/2016 7.3  6.5 - 8.1 g/dL Final  . Albumin 09/18/2016 4.2  3.5 - 5.0 g/dL Final  . AST 09/18/2016 17  15 - 41 U/L Final  . ALT 09/18/2016 11* 17 - 63 U/L Final  . Alkaline Phosphatase 09/18/2016 108  38 - 126 U/L Final  . Total Bilirubin 09/18/2016 0.6  0.3 - 1.2 mg/dL Final  . GFR calc non Af Amer 09/18/2016 >60  >60 mL/min Final  . GFR calc Af Amer 09/18/2016 >60  >60 mL/min Final   Comment: (NOTE) The eGFR has been calculated using the CKD EPI equation. This calculation has not been validated in all clinical situations. eGFR's persistently <60 mL/min signify possible Chronic Kidney Disease.   . Anion gap 09/18/2016 6  5 - 15 Final  . PSA 09/18/2016 1.17  0.00 - 4.00 ng/mL Final   Comment: (NOTE) While PSA levels of <=4.0 ng/ml are reported  as reference range, some men with levels below 4.0 ng/ml can have prostate cancer and many men with PSA above 4.0 ng/ml do not have prostate cancer.  Other tests such as free PSA, age specific reference ranges, PSA velocity and PSA doubling time may be helpful especially in men less than 64 years old. Performed at [facility], [place] [address] [place], [place] [address]     STUDIES: No results found.  ASSESSMENT:  Stage IV prostate cancer Gleason grade 8 with metastases to liver and bone.  PLAN:    1. Stage IV prostate cancer Gleason grade 8 with metastases to liver and bone: CT scan and bone scan results from March 11, 2016 reviewed independently with essentially stable disease. Patient has previously refused any antiestrogen therapy, Lupron, or chemotherapy with Taxotere. Patient also refused any radiation therapy or xgeva. He is currently on Zytiga and taking the correct dose. He only takes his prednisone occasionally.  PSA is trending up and is now 1.17. Continue to monitor closely. Return to clinic in 3 months with repeat laboratory work and further evaluation. 2. Bony lesions: Patient is refusing Xgeva or Zometa. 3.  Hypokalemia: Patient's potassium remains persistently low. He refuses to take oral or IV supplementation. 4. Urinary frequency: Likely secondary to enlarged prostate. Patient has Flomax listed in his medications, but is unclear his compliance.  A referral was also made to urology.  Patient expressed understanding and was in agreement with this plan. He also understands that He can call clinic at any time with any questions, concerns, or complaints.    Cancer of prostate [facility])   Staging form: Prostate, AJCC 7th Edition     Clinical: Stage IV (T1c, N0, M1, PSA: Less than 10, Gleason 8-10 - Poorly differentiated/undifferentiated (marked anaplasia)) - Signed by Forest Gleason, MD on 12/09/2014   [name], MD   09/22/2016 8:16 AM

## 2016-09-18 ENCOUNTER — Encounter: Payer: Self-pay | Admitting: Oncology

## 2016-09-18 ENCOUNTER — Inpatient Hospital Stay: Payer: Medicare Other | Attending: Oncology | Admitting: Oncology

## 2016-09-18 ENCOUNTER — Inpatient Hospital Stay: Payer: Medicare Other

## 2016-09-18 VITALS — BP 163/101 | HR 74 | Temp 98.3°F | Wt 157.4 lb

## 2016-09-18 DIAGNOSIS — R350 Frequency of micturition: Secondary | ICD-10-CM | POA: Insufficient documentation

## 2016-09-18 DIAGNOSIS — E876 Hypokalemia: Secondary | ICD-10-CM | POA: Diagnosis not present

## 2016-09-18 DIAGNOSIS — Z8673 Personal history of transient ischemic attack (TIA), and cerebral infarction without residual deficits: Secondary | ICD-10-CM | POA: Diagnosis not present

## 2016-09-18 DIAGNOSIS — C787 Secondary malignant neoplasm of liver and intrahepatic bile duct: Secondary | ICD-10-CM | POA: Insufficient documentation

## 2016-09-18 DIAGNOSIS — F17210 Nicotine dependence, cigarettes, uncomplicated: Secondary | ICD-10-CM | POA: Insufficient documentation

## 2016-09-18 DIAGNOSIS — C61 Malignant neoplasm of prostate: Secondary | ICD-10-CM | POA: Diagnosis not present

## 2016-09-18 DIAGNOSIS — Z85841 Personal history of malignant neoplasm of brain: Secondary | ICD-10-CM | POA: Diagnosis not present

## 2016-09-18 DIAGNOSIS — C7951 Secondary malignant neoplasm of bone: Secondary | ICD-10-CM

## 2016-09-18 LAB — CBC WITH DIFFERENTIAL/PLATELET
Basophils Absolute: 0.1 10*3/uL (ref 0–0.1)
Basophils Relative: 1 %
Eosinophils Absolute: 0.1 10*3/uL (ref 0–0.7)
Eosinophils Relative: 1 %
HCT: 41.8 % (ref 39.0–52.0)
Hemoglobin: 15 g/dL (ref 13.0–17.0)
Lymphocytes Relative: 22 %
Lymphs Abs: 2.2 10*3/uL (ref 1.0–3.6)
MCH: 32.6 pg (ref 26.0–34.0)
MCHC: 35.9 g/dL (ref 32.0–36.0)
MCV: 90.9 fL (ref 80.0–100.0)
Monocytes Absolute: 0.9 10*3/uL (ref 0.2–1.0)
Monocytes Relative: 9 %
Neutro Abs: 6.6 10*3/uL — ABNORMAL HIGH (ref 1.4–6.5)
Neutrophils Relative %: 67 %
Platelets: 224 10*3/uL (ref 150–440)
RBC: 4.6 MIL/uL (ref 4.40–5.90)
RDW: 13.3 % (ref 11.5–14.5)
WBC: 9.8 10*3/uL (ref 3.8–10.6)

## 2016-09-18 LAB — COMPREHENSIVE METABOLIC PANEL
ALK PHOS: 108 U/L (ref 38–126)
ALT: 11 U/L — AB (ref 17–63)
AST: 17 U/L (ref 15–41)
Albumin: 4.2 g/dL (ref 3.5–5.0)
Anion gap: 6 (ref 5–15)
BILIRUBIN TOTAL: 0.6 mg/dL (ref 0.3–1.2)
BUN: 14 mg/dL (ref 6–20)
CO2: 29 mmol/L (ref 22–32)
CREATININE: 0.6 mg/dL — AB (ref 0.61–1.24)
Calcium: 9.2 mg/dL (ref 8.9–10.3)
Chloride: 103 mmol/L (ref 101–111)
Glucose, Bld: 88 mg/dL (ref 65–99)
Potassium: 2.4 mmol/L — CL (ref 3.5–5.1)
Sodium: 138 mmol/L (ref 135–145)
Total Protein: 7.3 g/dL (ref 6.5–8.1)

## 2016-09-18 LAB — PSA: PSA: 1.17 ng/mL (ref 0.00–4.00)

## 2016-10-10 ENCOUNTER — Ambulatory Visit: Payer: Self-pay | Admitting: Urology

## 2016-12-11 ENCOUNTER — Telehealth: Payer: Self-pay | Admitting: *Deleted

## 2016-12-11 NOTE — Telephone Encounter (Signed)
Patient"s wife called to report severe pain in his right side she stated that he had been in pain for several weeks but this morning it got acutely worse. Advised wife that he had an appointment with Dr. Grayland Ormond on 12/16/2016 and that we could move that appointment to Monday, or the patient could see his PCP, or go to urgent care if pain was intolerable. Wife verbalized understanding.

## 2016-12-14 NOTE — Progress Notes (Signed)
[facility] [place]  Telephone:(336) [phone]  Fax:([phone]) [place] DOB: Dec 13, 1960  MR#: [id number]  IRC#:[id number]  Patient Care Team: Patient, No Pcp Per as PCP - General (General Practice) Clent Jacks, RN as Registered Nurse  CHIEF COMPLAINT:  Stage IV prostate cancer Gleason grade 8 with metastases to liver and bone.  INTERVAL HISTORY:  Patient returns to clinic today for routine evaluation and laboratory work. He is complaining of right back/flank pain. He otherwise feels well and and is asymptomatic. He continues to tolerate Zytiga well. He does not take prednisone regularly as prescribed. He continues to refuse any other interventions. He has no neurologic complaints. He denies any other pain. He has a good appetite and denies weight loss. He denies any recent fevers or illnesses. He denies any nausea, vomiting, constipation, or diarrhea. He does admit to urinary frequency especially at night, but no other urinary complaints. Patient otherwise feels well and offers no further specific complaints.   REVIEW OF SYSTEMS:   Review of Systems  Constitutional: Negative.  Negative for fever, malaise/fatigue and weight loss.  Respiratory: Negative.  Negative for cough and shortness of breath.   Cardiovascular: Negative.  Negative for chest pain and leg swelling.  Gastrointestinal: Negative.  Negative for abdominal pain.  Genitourinary: Positive for flank pain and frequency.  Skin: Negative.  Negative for rash.  Neurological: Negative.  Negative for weakness.  Psychiatric/Behavioral: Negative.  The patient is not nervous/anxious.     As per HPI. Otherwise, a complete review of systems is negative.  ONCOLOGY HISTORY: Oncology History   Carcinoma prostate adenocarcinoma in multiple fragments.  Gleason grade 8.  Involving neutral space.  Large duct and S cylinder-type.  Poorly differentiated.  Lateral neck involvement.Marland Kitchen PSAs low 2.4.  CT scan of abdomen  was abnormal with multiple liver lesions.  Diagnosis in April of 2016 Clinical staging T1 cN0 M1 stage IV disease 2, CT-guided liver biopsies consistent with metastases from the prostate cancer   3.  Patient refused any chemotherapy or Lupron Started on ZYTIGA and prednisone from May of 2016     Prostate cancer metastatic to bone [facility])   12/09/2014 Initial Diagnosis    Cancer of prostate       PAST MEDICAL HISTORY: Past Medical History:  Diagnosis Date  . Cancer of prostate ([place]) 12/09/2014  . Neuromuscular disorder ([place])    brain tumor at 56 years of age  . Stroke ([place])    at 56 years of age    PAST SURGICAL HISTORY: No past surgical history on file.  FAMILY HISTORY: Reviewed and unchanged. No reported history of malignancy or chronic disease.  ADVANCED DIRECTIVES:    HEALTH MAINTENANCE: Social History  Substance Use Topics  . Smoking status: Current Every Day Smoker    Packs/day: 0.50    Types: Cigarettes  . Smokeless tobacco: Never Used  . Alcohol use No      No Known Allergies  Current Outpatient Prescriptions  Medication Sig Dispense Refill  . abiraterone Acetate (ZYTIGA) 250 MG tablet Take 4 tablets (1,000 mg total) by mouth daily. 120 tablet 3  . levofloxacin (LEVAQUIN) 750 MG tablet Take 1 tablet (750 mg total) by mouth daily. 7 tablet 0  . lidocaine (LIDODERM) 5 % Place 1 patch onto the skin every 12 (twelve) hours. Remove & Discard patch within 12 hours or as directed by MD 10 patch 0  . potassium chloride SA (K-DUR,KLOR-CON) 20 MEQ tablet Take 1  tablet (20 mEq total) by mouth daily. (Patient not taking: Reported on 09/18/2016) 30 tablet 3   No current facility-administered medications for this visit.     OBJECTIVE: BP (!) 169/110   Pulse 78   Temp 98.3 F (36.8 C) (Tympanic)   Resp 20   Wt 154 lb 12.8 oz (70.2 kg)   BMI 24.25 kg/m    Body mass index is 24.25 kg/m.    ECOG FS:0 - Asymptomatic  General: Well-developed, well-nourished, no acute  distress. Lungs: Clear to auscultation bilaterally. Heart: Regular rate and rhythm. No rubs, murmurs, or gallops.e. Abdomen: Soft, nontender, nondistended. No organomegaly noted, normoactive bowel sounds. Musculoskeletal: No edema, cyanosis, or clubbing. Neuro: Alert, answering all questions appropriately. Cranial nerves grossly intact. Skin: No rashes or petechiae noted. Psych: Normal affect. Lymphatics: No cervical, clavicular LAD.   LAB RESULTS:  CBC    Component Value Date/Time   WBC 11.4 (H) 12/17/2016 1924   RBC 4.84 12/17/2016 1924   HGB 15.4 12/17/2016 1924   HGB 15.1 11/08/2014 1009   HCT 44.1 12/17/2016 1924   HCT 44.5 11/08/2014 1009   PLT 257 12/17/2016 1924   PLT 250 11/08/2014 1009   MCV 91.1 12/17/2016 1924   MCV 92 11/08/2014 1009   MCH 31.7 12/17/2016 1924   MCHC 34.8 12/17/2016 1924   RDW 13.9 12/17/2016 1924   RDW 13.2 11/08/2014 1009   LYMPHSABS 2.3 12/16/2016 1015   LYMPHSABS 2.0 11/08/2014 1009   MONOABS 0.8 12/16/2016 1015   MONOABS 0.8 11/08/2014 1009   EOSABS 0.1 12/16/2016 1015   EOSABS 0.1 11/08/2014 1009   BASOSABS 0.0 12/16/2016 1015   BASOSABS 0.1 11/08/2014 1009   BMET    Component Value Date/Time   NA 140 12/17/2016 1924   NA 134 (L) 11/08/2014 1009   K 2.7 (LL) 12/17/2016 1924   K 3.7 11/08/2014 1009   CL 102 12/17/2016 1924   CL 105 11/08/2014 1009   CO2 29 12/17/2016 1924   CO2 22 11/08/2014 1009   GLUCOSE 115 (H) 12/17/2016 1924   GLUCOSE 111 (H) 11/08/2014 1009   BUN 16 12/17/2016 1924   BUN 12 11/08/2014 1009   CREATININE 0.88 12/17/2016 1924   CREATININE 0.59 (L) 11/08/2014 1009   CALCIUM 9.9 12/17/2016 1924   CALCIUM 9.2 11/08/2014 1009   GFRNONAA >60 12/17/2016 1924   GFRNONAA >60 11/08/2014 1009   GFRAA >60 12/17/2016 1924   GFRAA >60 11/08/2014 1009   Lab Results  Component Value Date   PSA 3.90 12/16/2016     STUDIES: No results found.  ASSESSMENT:  Stage IV prostate cancer Gleason grade 8 with  metastases to liver and bone.  PLAN:    1. Stage IV prostate cancer Gleason grade 8 with metastases to liver and bone: CT scan and bone scan results from March 11, 2016 reviewed independently with essentially stable disease. Patient has previously refused any antiestrogen therapy, Lupron, or chemotherapy with Taxotere. Patient also refused any radiation therapy or xgeva. He is currently on Zytiga, but is unclear his compliance and the actual dose he is taking. He only takes his prednisone occasionally.  PSA continues to trend up. Patient has agreed to repeat nuclear med bone scan given his new right-sided flank pain which could possibly be metastatic disease. Continue to monitor closely. Return to clinic in 3 months with repeat laboratory work and further evaluation. 2. Bony lesions: Patient is refusing Xgeva or Zometa. 3. Hypokalemia: Patient's potassium remains persistently low. He refuses to  take oral or IV supplementation. 4. Urinary frequency: Likely secondary to enlarged prostate. Patient has Flomax listed in his medications, but is unclear his compliance.  A referral was also made to urology. 5. Flank pain: Have recommended patient take Tylenol or ibuprofen sparingly. Nuclear med bone scan as above.  Patient expressed understanding and was in agreement with this plan. He also understands that He can call clinic at any time with any questions, concerns, or complaints.    Cancer of prostate [facility])   Staging form: Prostate, AJCC 7th Edition     Clinical: Stage IV (T1c, N0, M1, PSA: Less than 10, Gleason 8-10 - Poorly differentiated/undifferentiated (marked anaplasia)) - Signed by Forest Gleason, MD on 12/09/2014   [name], MD   12/22/2016 6:47 PM

## 2016-12-16 ENCOUNTER — Inpatient Hospital Stay: Payer: Medicare Other | Attending: Oncology

## 2016-12-16 ENCOUNTER — Inpatient Hospital Stay (HOSPITAL_BASED_OUTPATIENT_CLINIC_OR_DEPARTMENT_OTHER): Payer: Medicare Other | Admitting: Oncology

## 2016-12-16 VITALS — BP 169/110 | HR 78 | Temp 98.3°F | Resp 20 | Wt 154.8 lb

## 2016-12-16 DIAGNOSIS — Z8673 Personal history of transient ischemic attack (TIA), and cerebral infarction without residual deficits: Secondary | ICD-10-CM | POA: Insufficient documentation

## 2016-12-16 DIAGNOSIS — F17210 Nicotine dependence, cigarettes, uncomplicated: Secondary | ICD-10-CM | POA: Diagnosis not present

## 2016-12-16 DIAGNOSIS — C7951 Secondary malignant neoplasm of bone: Secondary | ICD-10-CM

## 2016-12-16 DIAGNOSIS — C61 Malignant neoplasm of prostate: Secondary | ICD-10-CM | POA: Diagnosis not present

## 2016-12-16 DIAGNOSIS — M549 Dorsalgia, unspecified: Secondary | ICD-10-CM | POA: Diagnosis not present

## 2016-12-16 DIAGNOSIS — Z79899 Other long term (current) drug therapy: Secondary | ICD-10-CM

## 2016-12-16 DIAGNOSIS — C787 Secondary malignant neoplasm of liver and intrahepatic bile duct: Secondary | ICD-10-CM

## 2016-12-16 DIAGNOSIS — E876 Hypokalemia: Secondary | ICD-10-CM | POA: Diagnosis not present

## 2016-12-16 DIAGNOSIS — Z8669 Personal history of other diseases of the nervous system and sense organs: Secondary | ICD-10-CM | POA: Diagnosis not present

## 2016-12-16 LAB — CBC WITH DIFFERENTIAL/PLATELET
BASOS PCT: 0 %
Basophils Absolute: 0 10*3/uL (ref 0–0.1)
Eosinophils Absolute: 0.1 10*3/uL (ref 0–0.7)
Eosinophils Relative: 1 %
HEMATOCRIT: 41.3 % (ref 40.0–52.0)
Hemoglobin: 14.7 g/dL (ref 13.0–18.0)
LYMPHS ABS: 2.3 10*3/uL (ref 1.0–3.6)
LYMPHS PCT: 22 %
MCH: 32 pg (ref 26.0–34.0)
MCHC: 35.6 g/dL (ref 32.0–36.0)
MCV: 89.8 fL (ref 80.0–100.0)
MONO ABS: 0.8 10*3/uL (ref 0.2–1.0)
MONOS PCT: 7 %
NEUTROS ABS: 7.4 10*3/uL — AB (ref 1.4–6.5)
Neutrophils Relative %: 70 %
Platelets: 245 10*3/uL (ref 150–440)
RBC: 4.6 MIL/uL (ref 4.40–5.90)
RDW: 14.3 % (ref 11.5–14.5)
WBC: 10.5 10*3/uL (ref 3.8–10.6)

## 2016-12-16 LAB — COMPREHENSIVE METABOLIC PANEL
ALT: 10 U/L — ABNORMAL LOW (ref 17–63)
ANION GAP: 5 (ref 5–15)
AST: 17 U/L (ref 15–41)
Albumin: 4.2 g/dL (ref 3.5–5.0)
Alkaline Phosphatase: 133 U/L — ABNORMAL HIGH (ref 38–126)
BILIRUBIN TOTAL: 0.6 mg/dL (ref 0.3–1.2)
BUN: 10 mg/dL (ref 6–20)
CALCIUM: 9.2 mg/dL (ref 8.9–10.3)
CO2: 32 mmol/L (ref 22–32)
Chloride: 102 mmol/L (ref 101–111)
Creatinine, Ser: 0.67 mg/dL (ref 0.61–1.24)
GFR calc Af Amer: >60 mL/min (ref >60)
GLUCOSE: 98 mg/dL (ref 65–99)
Potassium: 2.6 mmol/L — CL (ref 3.5–5.1)
Sodium: 139 mmol/L (ref 135–145)
TOTAL PROTEIN: 7.5 g/dL (ref 6.5–8.1)

## 2016-12-16 LAB — PSA: PSA: 3.9 ng/mL (ref 0.00–4.00)

## 2016-12-16 NOTE — Progress Notes (Signed)
Patient reports pain to right side and back.

## 2016-12-17 ENCOUNTER — Other Ambulatory Visit: Payer: Self-pay

## 2016-12-17 ENCOUNTER — Emergency Department
Admission: EM | Admit: 2016-12-17 | Discharge: 2016-12-17 | Disposition: A | Discharge status: Home / Self Care | Payer: Medicare Other | Attending: Emergency Medicine | Admitting: Emergency Medicine

## 2016-12-17 ENCOUNTER — Emergency Department: Payer: Medicare Other

## 2016-12-17 ENCOUNTER — Encounter: Payer: Self-pay | Admitting: Emergency Medicine

## 2016-12-17 DIAGNOSIS — E876 Hypokalemia: Secondary | ICD-10-CM

## 2016-12-17 DIAGNOSIS — F17210 Nicotine dependence, cigarettes, uncomplicated: Secondary | ICD-10-CM | POA: Insufficient documentation

## 2016-12-17 DIAGNOSIS — R079 Chest pain, unspecified: Secondary | ICD-10-CM | POA: Diagnosis not present

## 2016-12-17 DIAGNOSIS — J168 Pneumonia due to other specified infectious organisms: Secondary | ICD-10-CM | POA: Insufficient documentation

## 2016-12-17 DIAGNOSIS — C61 Malignant neoplasm of prostate: Secondary | ICD-10-CM | POA: Diagnosis not present

## 2016-12-17 DIAGNOSIS — Z8546 Personal history of malignant neoplasm of prostate: Secondary | ICD-10-CM | POA: Insufficient documentation

## 2016-12-17 DIAGNOSIS — R0789 Other chest pain: Secondary | ICD-10-CM | POA: Diagnosis not present

## 2016-12-17 DIAGNOSIS — M549 Dorsalgia, unspecified: Secondary | ICD-10-CM | POA: Diagnosis not present

## 2016-12-17 DIAGNOSIS — J181 Lobar pneumonia, unspecified organism: Secondary | ICD-10-CM

## 2016-12-17 DIAGNOSIS — J189 Pneumonia, unspecified organism: Secondary | ICD-10-CM

## 2016-12-17 DIAGNOSIS — C787 Secondary malignant neoplasm of liver and intrahepatic bile duct: Secondary | ICD-10-CM | POA: Diagnosis not present

## 2016-12-17 DIAGNOSIS — C7951 Secondary malignant neoplasm of bone: Secondary | ICD-10-CM | POA: Diagnosis not present

## 2016-12-17 DIAGNOSIS — R911 Solitary pulmonary nodule: Secondary | ICD-10-CM | POA: Diagnosis not present

## 2016-12-17 LAB — BASIC METABOLIC PANEL
ANION GAP: 9 (ref 5–15)
BUN: 16 mg/dL (ref 6–20)
CALCIUM: 9.9 mg/dL (ref 8.9–10.3)
CHLORIDE: 102 mmol/L (ref 101–111)
CO2: 29 mmol/L (ref 22–32)
Creatinine, Ser: 0.88 mg/dL (ref 0.61–1.24)
GFR calc non Af Amer: >60 mL/min (ref >60)
Glucose, Bld: 115 mg/dL — ABNORMAL HIGH (ref 65–99)
Potassium: 2.7 mmol/L — CL (ref 3.5–5.1)
Sodium: 140 mmol/L (ref 135–145)

## 2016-12-17 LAB — CBC
HCT: 44.1 % (ref 40.0–52.0)
HEMOGLOBIN: 15.4 g/dL (ref 13.0–18.0)
MCH: 31.7 pg (ref 26.0–34.0)
MCHC: 34.8 g/dL (ref 32.0–36.0)
MCV: 91.1 fL (ref 80.0–100.0)
Platelets: 257 10*3/uL (ref 150–440)
RBC: 4.84 MIL/uL (ref 4.40–5.90)
RDW: 13.9 % (ref 11.5–14.5)
WBC: 11.4 10*3/uL — ABNORMAL HIGH (ref 3.8–10.6)

## 2016-12-17 LAB — HEPATIC FUNCTION PANEL
ALBUMIN: 4.2 g/dL (ref 3.5–5.0)
ALT: 12 U/L — AB (ref 17–63)
AST: 21 U/L (ref 15–41)
Alkaline Phosphatase: 125 U/L (ref 38–126)
Total Bilirubin: 0.3 mg/dL (ref 0.3–1.2)
Total Protein: 7.8 g/dL (ref 6.5–8.1)

## 2016-12-17 LAB — TROPONIN I
Troponin I: <0.03 ng/mL (ref <0.03)
Troponin I: <0.03 ng/mL (ref <0.03)

## 2016-12-17 LAB — LIPASE, BLOOD: Lipase: 21 U/L (ref 11–51)

## 2016-12-17 MED ORDER — POTASSIUM CHLORIDE CRYS ER 20 MEQ PO TBCR
40.0000 meq | EXTENDED_RELEASE_TABLET | Freq: Once | ORAL | Status: AC
  Administered 2016-12-17: 40 meq via ORAL
  Filled 2016-12-17: qty 2

## 2016-12-17 MED ORDER — LIDOCAINE 5 % EX PTCH: 1.0000 | MEDICATED_PATCH | Freq: Two times a day (BID) | CUTANEOUS | 0 refills | Status: DC

## 2016-12-17 MED ORDER — POTASSIUM CHLORIDE 10 MEQ/100ML IV SOLN
10.0000 meq | INTRAVENOUS | Status: DC
  Filled 2016-12-17 (×2): qty 100

## 2016-12-17 MED ORDER — LEVOFLOXACIN 750 MG PO TABS: 750.0000 mg | ORAL_TABLET | Freq: Every day | ORAL | 0 refills | Status: AC

## 2016-12-17 MED ORDER — SODIUM CHLORIDE 0.9 % IV BOLUS (SEPSIS)
1000.0000 mL | Freq: Once | INTRAVENOUS | Status: AC
  Administered 2016-12-17: 1000 mL via INTRAVENOUS

## 2016-12-17 MED ORDER — OXYCODONE-ACETAMINOPHEN 5-325 MG PO TABS: 1.0000 | ORAL_TABLET | ORAL | 0 refills | Status: DC | PRN

## 2016-12-17 MED ORDER — LEVOFLOXACIN IN D5W 750 MG/150ML IV SOLN
INTRAVENOUS | Status: AC
  Administered 2016-12-17: 750 mg via INTRAVENOUS
  Filled 2016-12-17: qty 150

## 2016-12-17 MED ORDER — LEVOFLOXACIN 750 MG PO TABS: 750.0000 mg | ORAL_TABLET | Freq: Every day | ORAL | 0 refills | Status: DC

## 2016-12-17 MED ORDER — LEVOFLOXACIN IN D5W 750 MG/150ML IV SOLN
750.0000 mg | Freq: Once | INTRAVENOUS | Status: AC
  Administered 2016-12-17: 750 mg via INTRAVENOUS

## 2016-12-17 NOTE — ED Triage Notes (Signed)
Pt ambulatory to triage with steady gait, no distress noted. Pt c/o right side rib pain x1 month. Pt denies, SOB, radiation of pain location, past cardiac or pulmonary HX.

## 2016-12-17 NOTE — Discharge Instructions (Signed)
Please call your doctor tomorrow to schedule a follow up appointment and to let him know that you were seen in the emergency department and started on antibiotics for pneumonia.  Return to the emergency department for severe pain, shortness of breath, lightheadedness or fainting, nausea or vomiting, or any other symptoms concerning to you.

## 2016-12-17 NOTE — ED Notes (Signed)
Date and time results received: 12/17/16 now (use smartphrase ".now" to insert current time)  Test: K+ Critical Value: 2.7  Name of Provider Notified: Dr. Mariea Clonts  Orders Received? Or Actions Taken?: DR. Mariea Clonts informed

## 2016-12-17 NOTE — ED Provider Notes (Signed)
[facility] Emergency Department Provider Note  ____________________________________________  Time seen: Approximately 8:45 PM  I have reviewed the triage vital signs and the nursing notes.   HISTORY  Chief Complaint Chest Pain    HPI Roger Sparks. is a 56 y.o. male hormonal therapy for prostate cancer metastatic to the liver, the left before meals joint, the right seventh costovertebral junction and the left ischial tuberosity presenting with right lateral chest wallpain, productive cough, and fever. The patient reports that for the past month he has had a progressively worsening right sided rib pain without any associated left-sided chest pain, shortness breath, lower extremity swelling or calf pain. The pain is worse with palpation or positional changes, or with cough. No trauma. Over the past several days, the patient has also had a cough productive of thick brown sputum with associated fever but no sore throat, ear pain. No lightheadedness or syncope.   Past Medical History:  Diagnosis Date  . Cancer of prostate ([place]) 12/09/2014  . Neuromuscular disorder ([place])    brain tumor at 56 years of age  . Stroke [facility])    at 56 years of age    Patient Active Problem List   Diagnosis Date Noted  . Prostate cancer metastatic to bone ([place]) 12/09/2014  . Brain tumor, glioma ([place]) 07/16/2014  . Right arm weakness 07/16/2014  . Cerebral seizure 07/16/2014  . Other symptoms and signs involving the musculoskeletal system 07/16/2014  . Tear film insufficiency 11/10/2011  . Trichiasis of eyelid 11/10/2011    History reviewed. No pertinent surgical history.  Current Outpatient Rx  . Order #: [id number] Class: Normal  . Order #: [id number] Class: Print  . Order #: [id number] Class: Print  . Order #: [id number] Class: Normal    Allergies Patient has no known allergies.  History reviewed. No pertinent family history.  Social History Social History  Substance  Use Topics  . Smoking status: Current Every Day Smoker    Packs/day: 0.50    Types: Cigarettes  . Smokeless tobacco: Never Used  . Alcohol use No    Review of Systems Constitutional: Positive subjective fever. No lightheadedness or syncope. Eyes: No visual changes. ENT: No sore throat. No congestion or rhinorrhea. Cardiovascular: Positive right lateral chest wall pain.  Denies palpitations. Respiratory: Denies shortness of breath.  Positive productive cough. Gastrointestinal: No abdominal pain.  No nausea, no vomiting.  No diarrhea.  No constipation. Genitourinary: Negative for dysuria. Musculoskeletal: Negative for back pain. No lower extremity swelling or calf pain. Skin: Negative for rash. Neurological: Negative for headaches. No focal numbness, tingling or weakness.   10-point ROS otherwise negative.  ____________________________________________   PHYSICAL EXAM:  VITAL SIGNS: ED Triage Vitals  Enc Vitals Group     BP 12/17/16 1930 (!) 159/100     Pulse Rate 12/17/16 1930 (!) 101     Resp 12/17/16 1930 17     Temp 12/17/16 1930 98.1 F (36.7 C)     Temp Source 12/17/16 1930 Oral     SpO2 12/17/16 1930 97 %     Weight 12/17/16 1926 154 lb (69.9 kg)     Height 12/17/16 1926 5\' 6"  (1.676 m)     Head Circumference --      Peak Flow --      Pain Score 12/17/16 1926 4     Pain Loc --      Pain Edu? --      Excl. in [place]? --     Constitutional: Alert  and oriented. Chronically ill appearing and in no acute distress. Answers questions appropriately. Eyes: Conjunctivae are normal.  EOMI. No scleral icterus. No eye discharge. Head: Atraumatic. Nose: No congestion/rhinnorhea. Mouth/Throat: Mucous membranes are mildly dry.  Neck: No stridor.  Supple.  No JVD. No meningismus. Cardiovascular: Normal rate, regular rhythm. No murmurs, rubs or gallops.  Respiratory: Normal respiratory effort.  No accessory muscle use or retractions. Lungs CTAB.  No wheezes, rales or ronchi. The  patient's pain is reproducible with palpation of the right lateral chest wall; there is no overlying erythema, ecchymosis, or palpable crepitus. Gastrointestinal: Soft, nontender and nondistended.  No guarding or rebound.  No peritoneal signs. Musculoskeletal: No LE edema. No ttp in the calves or palpable cords.  Negative Homan's sign. Neurologic:  A&Ox3.  Speech is clear.  Face and smile are symmetric.  EOMI.  right upper extremity is paralyzed with significantly less muscle tone in the left upper extremity. Skin:  Skin is warm, dry and intact. No rash noted. Psychiatric: Mood and affect are normal. Speech and behavior are normal.  Normal judgement.  ____________________________________________   LABS (all labs ordered are listed, but only abnormal results are displayed)  Labs Reviewed  BASIC METABOLIC PANEL - Abnormal; Notable for the following:       Result Value   Potassium 2.7 (*)    Glucose, Bld 115 (*)    All other components within normal limits  CBC - Abnormal; Notable for the following:    WBC 11.4 (*)    All other components within normal limits  HEPATIC FUNCTION PANEL - Abnormal; Notable for the following:    ALT 12 (*)    Bilirubin, Direct <0.1 (*)    All other components within normal limits  CULTURE, BLOOD (ROUTINE X 2)  CULTURE, BLOOD (ROUTINE X 2)  TROPONIN I  LIPASE, BLOOD  TROPONIN I   ____________________________________________  EKG  ED ECG REPORT I, Eula Listen, the attending physician, personally viewed and interpreted this ECG.   Date: 12/17/2016  EKG Time: 1926  Rate: 89  Rhythm: normal sinus rhythm  Axis: normal  Intervals:none  ST&T Change: The patient has 0.5 mm of ST elevation in V1 and V2 and AVR with 0.5 mm of ST depression in V3.  There are no prior EKGs for comparison.  Repeat EKG: ED ECG REPORT I, Eula Listen, the attending physician, personally viewed and interpreted this ECG.   Date: 12/17/2016  EKG Time:  2044  Rate: 78  Rhythm: normal sinus rhythm  Axis: normal  Intervals:none  ST&T Change: no STEMI; the patient continues to have 0.25 mm ST elevation in V1. Overall, the morphology of the EKG is similar to the prior EKG and again we do not have a comparison.   ____________________________________________  RADIOLOGY  Dg Chest 2 View  Result Date: 12/17/2016 CLINICAL DATA:  Right-sided chest and rib pain. Personal history of prostate cancer. EXAM: CHEST  2 VIEW COMPARISON:  Whole-body bone scan 03/11/2016 FINDINGS: The heart size is normal. There is no edema or effusion to suggest failure. posteromedial right lower lobe ill-defined airspace disease is noted. The lungs are otherwise clear. Mild hyperinflation is noted. The visualized soft tissues and bony thorax are unremarkable. Remote fractures are noted in the fifth, sixth, and seventh bilateral ribs. IMPRESSION: 1. Ill-defined posterior right lower lobe airspace disease is concerning for pneumonia. 2. Remote right-sided rib fractures. Electronically Signed   By: San Morelle M.D.   On: 12/17/2016 19:43   Ct Chest Wo  Contrast  Result Date: 12/17/2016 CLINICAL DATA:  Right lateral chest wall pain and cough. Right rib pain for 1 month. Current smoker. History of prostate cancer. EXAM: CT CHEST WITHOUT CONTRAST TECHNIQUE: Multidetector CT imaging of the chest was performed following the standard protocol without IV contrast. COMPARISON:  11/08/2014 FINDINGS: Cardiovascular: Normal heart size. No pericardial effusion. Coronary artery calcifications. Normal caliber thoracic aorta. Few scattered aortic calcifications. Mediastinum/Nodes: Mediastinal lymph nodes are not pathologically enlarged. No significant changes since previous study. Esophagus is mostly decompressed. Lungs/Pleura: Emphysematous changes throughout the lungs. No focal consolidation. No airspace disease. Noncalcified nodule in the right middle lung measures 7 mm diameter. No  change since prior study. No new nodules identified. No pleural effusions. No pneumothorax. Airways are patent. Upper Abdomen: Multiple liver lesions are decreased in size since previous study suggesting response to therapy. Largest lesion is in the anterior right lobe and measures about 2.5 cm diameter, compared with 3.8 cm previously. Cholelithiasis with multiple stones in the gallbladder. No bile duct dilatation. Musculoskeletal: Degenerative changes in the spine. Focal sclerosis in the right transverse process of T7 is unchanged since previous study. No other focal bone lesions identified. IMPRESSION: Emphysematous changes throughout the lungs. No focal consolidation. 7 mm right middle lung nodule is not significantly changed since previous study. Multiple liver lesions appear to have decreased in size since prior study. Focal sclerosis in the right transverse process of T7 is unchanged. No new bone lesions or pulmonary nodules. Electronically Signed   By: Lucienne Capers M.D.   On: 12/17/2016 21:14    ____________________________________________   PROCEDURES  Procedure(s) performed: None  Procedures  Critical Care performed: No ____________________________________________   INITIAL IMPRESSION / ASSESSMENT AND PLAN / ED COURSE  Pertinent labs & imaging results that were available during my care of the patient were reviewed by me and considered in my medical decision making (see chart for details).  56 y.o. male with a history of metastatic prostate cancer, ongoing tobacco abuse, presenting with right lateral chest wall pain, now with cough and fever at home. Overall, the patient has reassuring vital signs and is afebrile here. However, he is chronically ill-appearing and currently under treatment for prostate cancer. His chest x-ray does show an ill-defined posterior right lower lobe airspace infiltrate concerning for pneumonia, so plan to treat him for this. However, given how reproducible  his pain is colic also do a CT of the chest to evaluate for any large evidence of metastatic illness in the ribs. We'll do an ambulatory pulse ox. The patient is chronically hypokalemic, today with a potassium of 2.7 and I have ordered 40 mg of potassium for supplementation. The patient does have some minimal ST changes and his first EKG with similar morphology and a second EKG time: He is not having any left-sided chest pain and ACS or MI is much less likely but I'll get a second troponin. His first troponin is negative. UA patient for final disposition.  ----------------------------------------- 9:35 PM on 12/17/2016 -----------------------------------------  The patient continues to rest comfortably in the emergency department. He continues to be hemodynamically stable. His CT angiogram does not show pneumonia, nor any other focal new findings. The patient was able to ambulate and maintain oxygen saturations of 98%. He has received a first dose of IV Levaquin. I am awaiting for the remainder of his laboratory studies, and I have confirmed that he is not currently taking any chemotherapy therapeutics. I anticipate that he may be able to be discharged home  with oral antibiotics and close follow-up; he follows with Dr. Grayland Ormond at the cancer center.  ----------------------------------------- 10:05 PM on 12/17/2016 -----------------------------------------  At this time, the patient continues to feel well and I have discussed his results with him and his wife. He states that he has chronically low potassium, but that the potassium tablets make him sick. I have asked him to discuss this with Dr. Grayland Ormond, his cancer doctor. At this time, the patient is safe for discharge. Return impressions as well as follow-up instructions were discussed.  ____________________________________________  FINAL CLINICAL IMPRESSION(S) / ED DIAGNOSES  Final diagnoses:  Pneumonia of right lower lobe due to infectious  organism ([place])  Right-sided chest pain  Hypokalemia         NEW MEDICATIONS STARTED DURING THIS VISIT:  New Prescriptions   LEVOFLOXACIN (LEVAQUIN) 750 MG TABLET    Take 1 tablet (750 mg total) by mouth daily.   OXYCODONE-ACETAMINOPHEN (ROXICET) 5-325 MG TABLET    Take 1 tablet by mouth every 4 (four) hours as needed.      Eula Listen, MD 12/17/16 2206

## 2016-12-17 NOTE — ED Notes (Signed)
Pt o2 sats 98% while ambulating, denies any SOB

## 2016-12-18 ENCOUNTER — Telehealth: Payer: Self-pay | Admitting: *Deleted

## 2016-12-18 NOTE — Telephone Encounter (Signed)
Wanted Dr Grayland Ormond to know he went to ER and has been diagnosed with pneumonia. They started him on Abx.

## 2016-12-22 LAB — CULTURE, BLOOD (ROUTINE X 2)
CULTURE: NO GROWTH
CULTURE: NO GROWTH

## 2016-12-30 ENCOUNTER — Encounter
Admission: RE | Admit: 2016-12-30 | Discharge: 2016-12-30 | Disposition: A | Discharge status: Home / Self Care | Payer: Medicare Other | Source: Ambulatory Visit | Attending: Oncology | Admitting: Oncology

## 2016-12-30 DIAGNOSIS — C7951 Secondary malignant neoplasm of bone: Secondary | ICD-10-CM | POA: Insufficient documentation

## 2016-12-30 DIAGNOSIS — C61 Malignant neoplasm of prostate: Secondary | ICD-10-CM | POA: Insufficient documentation

## 2016-12-30 DIAGNOSIS — C7982 Secondary malignant neoplasm of genital organs: Secondary | ICD-10-CM | POA: Diagnosis not present

## 2016-12-30 DIAGNOSIS — C801 Malignant (primary) neoplasm, unspecified: Secondary | ICD-10-CM | POA: Diagnosis not present

## 2016-12-30 MED ORDER — TECHNETIUM TC 99M MEDRONATE IV KIT
25.0000 | PACK | Freq: Once | INTRAVENOUS | Status: AC | PRN
  Administered 2016-12-30: 23.47 via INTRAVENOUS

## 2017-01-06 ENCOUNTER — Emergency Department
Admission: EM | Admit: 2017-01-06 | Discharge: 2017-01-06 | Disposition: A | Discharge status: Home / Self Care | Payer: Medicare Other | Attending: Emergency Medicine | Admitting: Emergency Medicine

## 2017-01-06 ENCOUNTER — Emergency Department: Payer: Medicare Other

## 2017-01-06 ENCOUNTER — Encounter: Payer: Self-pay | Admitting: Emergency Medicine

## 2017-01-06 ENCOUNTER — Telehealth: Payer: Self-pay | Admitting: *Deleted

## 2017-01-06 DIAGNOSIS — C787 Secondary malignant neoplasm of liver and intrahepatic bile duct: Secondary | ICD-10-CM | POA: Diagnosis not present

## 2017-01-06 DIAGNOSIS — G893 Neoplasm related pain (acute) (chronic): Secondary | ICD-10-CM | POA: Insufficient documentation

## 2017-01-06 DIAGNOSIS — C7951 Secondary malignant neoplasm of bone: Secondary | ICD-10-CM | POA: Insufficient documentation

## 2017-01-06 DIAGNOSIS — C61 Malignant neoplasm of prostate: Secondary | ICD-10-CM | POA: Insufficient documentation

## 2017-01-06 DIAGNOSIS — R079 Chest pain, unspecified: Secondary | ICD-10-CM | POA: Diagnosis not present

## 2017-01-06 DIAGNOSIS — R0789 Other chest pain: Secondary | ICD-10-CM | POA: Diagnosis present

## 2017-01-06 DIAGNOSIS — F17210 Nicotine dependence, cigarettes, uncomplicated: Secondary | ICD-10-CM | POA: Diagnosis not present

## 2017-01-06 LAB — CBC
HCT: 45.1 % (ref 40.0–52.0)
HEMOGLOBIN: 16 g/dL (ref 13.0–18.0)
MCH: 32.2 pg (ref 26.0–34.0)
MCHC: 35.4 g/dL (ref 32.0–36.0)
MCV: 91 fL (ref 80.0–100.0)
Platelets: 266 10*3/uL (ref 150–440)
RBC: 4.96 MIL/uL (ref 4.40–5.90)
RDW: 14.2 % (ref 11.5–14.5)
WBC: 14.6 10*3/uL — ABNORMAL HIGH (ref 3.8–10.6)

## 2017-01-06 LAB — BASIC METABOLIC PANEL
ANION GAP: 8 (ref 5–15)
BUN: 12 mg/dL (ref 6–20)
CO2: 29 mmol/L (ref 22–32)
Calcium: 9.4 mg/dL (ref 8.9–10.3)
Chloride: 99 mmol/L — ABNORMAL LOW (ref 101–111)
Creatinine, Ser: 0.7 mg/dL (ref 0.61–1.24)
GFR calc Af Amer: >60 mL/min (ref >60)
GFR calc non Af Amer: >60 mL/min (ref >60)
GLUCOSE: 102 mg/dL — AB (ref 65–99)
Potassium: 3.1 mmol/L — ABNORMAL LOW (ref 3.5–5.1)
Sodium: 136 mmol/L (ref 135–145)

## 2017-01-06 LAB — TROPONIN I: Troponin I: <0.03 ng/mL (ref <0.03)

## 2017-01-06 MED ORDER — LIDOCAINE 5 % EX PTCH: 1.0000 | MEDICATED_PATCH | Freq: Two times a day (BID) | CUTANEOUS | 0 refills | Status: DC

## 2017-01-06 MED ORDER — OXYCODONE-ACETAMINOPHEN 5-325 MG PO TABS
1.0000 | ORAL_TABLET | Freq: Once | ORAL | Status: AC
  Administered 2017-01-06: 1 via ORAL
  Filled 2017-01-06: qty 1

## 2017-01-06 MED ORDER — LIDOCAINE 5 % EX PTCH
1.0000 | MEDICATED_PATCH | CUTANEOUS | Status: DC
  Administered 2017-01-06: 1 via TRANSDERMAL
  Filled 2017-01-06: qty 1

## 2017-01-06 MED ORDER — OXYCODONE HCL 5 MG PO TABS: 5.0000 mg | ORAL_TABLET | ORAL | 0 refills | Status: DC | PRN

## 2017-01-06 NOTE — Telephone Encounter (Signed)
Dr Janese Banks came to me and asked that Roger Sparks get put on Dr Gary Fleet schedule within the next 7 days. Patient went to ER for pain a and she told them to give him enough med for 1 week and that we would get him in to see Dr Grayland Ormond. Message sent to scheuling

## 2017-01-06 NOTE — ED Provider Notes (Signed)
[facility] Emergency Department Provider Note  ____________________________________________  Time seen: Approximately 2:41 PM  I have reviewed the triage vital signs and the nursing notes.   HISTORY  Chief Complaint Cough    HPI Roger Sparks. is a 56 y.o. male on hormonal therapy for prostate cancer metastatic to the liver, mid thoracic spine and right posterior rib, presenting with right lateral chest wallpain.  I saw the patient 12/17/16, for similar pain but at that time he also had cough and fever. The cough and fever have completely resolved, but the patient continues to have pain. He has tried Motrin and Aleve without any improvement. He denies any shortness of breath, lightheadedness, palpitations or syncope.   Past Medical History:  Diagnosis Date  . Cancer of prostate ([place]) 12/09/2014  . Neuromuscular disorder ([place])    brain tumor at 56 years of age  . Stroke [facility])    at 56 years of age    Patient Active Problem List   Diagnosis Date Noted  . Prostate cancer metastatic to bone ([place]) 12/09/2014  . Brain tumor, glioma ([place]) 07/16/2014  . Right arm weakness 07/16/2014  . Cerebral seizure 07/16/2014  . Other symptoms and signs involving the musculoskeletal system 07/16/2014  . Tear film insufficiency 11/10/2011  . Trichiasis of eyelid 11/10/2011    History reviewed. No pertinent surgical history.  Current Outpatient Rx  . Order #: [id number] Class: Normal  . Order #: [id number] Class: Print  . Order #: [id number] Class: Normal    Allergies Patient has no known allergies.  History reviewed. No pertinent family history.  Social History Social History  Substance Use Topics  . Smoking status: Current Every Day Smoker    Packs/day: 0.50    Types: Cigarettes  . Smokeless tobacco: Never Used  . Alcohol use No    Review of Systems Constitutional: No fever/chills. No lightheadedness or syncope. Eyes: No visual changes. ENT: No  sore throat. No congestion or rhinorrhea. Cardiovascular: Positive right lateral chest pain. Denies palpitations. Respiratory: Denies shortness of breath.  No cough. Gastrointestinal: No abdominal pain.  No nausea, no vomiting.  No diarrhea.  No constipation. Genitourinary: Negative for dysuria. Musculoskeletal: Negative for back pain. Skin: Negative for rash. Neurological: Negative for headaches. No focal numbness, tingling or weakness.     ____________________________________________   PHYSICAL EXAM:  VITAL SIGNS: ED Triage Vitals  Enc Vitals Group     BP 01/06/17 1220 (!) 158/113     Pulse Rate 01/06/17 1220 91     Resp 01/06/17 1220 16     Temp 01/06/17 1220 97.9 F (36.6 C)     Temp Source 01/06/17 1220 Oral     SpO2 01/06/17 1220 99 %     Weight 01/06/17 1221 150 lb (68 kg)     Height 01/06/17 1221 5\' 7"  (1.702 m)     Head Circumference --      Peak Flow --      Pain Score 01/06/17 1223 10     Pain Loc --      Pain Edu? --      Excl. in [place]? --     Constitutional: Alert and oriented. Chronically ill appearing and in no acute distress. Answers questions appropriately. Eyes: Conjunctivae are normal.  EOMI. No scleral icterus. Head: Atraumatic. Nose: No congestion/rhinnorhea. Mouth/Throat: Mucous membranes are moist.  Neck: No stridor.  Supple.  No meningismus. Cardiovascular: Normal rate, regular rhythm. No murmurs, rubs or gallops. I'm able to reproduce the patient's  pain by pushing over the right lateral chest wall over the last 2 or 3 ribs. He does not have any skin changes, ecchymosis, or swelling; no crepitus. Respiratory: Normal respiratory effort.  No accessory muscle use or retractions. Lungs CTAB.  No wheezes, rales or ronchi. Gastrointestinal: Soft, nontender and nondistended.  No guarding or rebound.  No peritoneal signs. Musculoskeletal: No LE edema. No ttp in the calves or palpable cords.  Negative Homan's sign. Neurologic:  A&Ox3.  Speech is clear.  Face  and smile are symmetric.  EOMI.  Moves all extremities well. Skin:  Skin is warm, dry and intact. No rash noted. Psychiatric: Mood and affect are normal.   ____________________________________________   LABS (all labs ordered are listed, but only abnormal results are displayed)  Labs Reviewed  BASIC METABOLIC PANEL - Abnormal; Notable for the following:       Result Value   Potassium 3.1 (*)    Chloride 99 (*)    Glucose, Bld 102 (*)    All other components within normal limits  CBC - Abnormal; Notable for the following:    WBC 14.6 (*)    All other components within normal limits  TROPONIN I   ____________________________________________  EKG  ED ECG REPORT I, Eula Listen, the attending physician, personally viewed and interpreted this ECG.   Date: 01/06/2017  EKG Time: 1235  Rate: 82  Rhythm: normal sinus rhythm  Axis: normal  Intervals:none  ST&T Change: No STEMI  ____________________________________________  RADIOLOGY  Dg Chest 2 View  Result Date: 01/06/2017 CLINICAL DATA:  Chest pain. EXAM: CHEST  2 VIEW COMPARISON:  Radiographs Dec 17, 2016. FINDINGS: The heart size and mediastinal contours are within normal limits. Both lungs are clear. No pneumothorax or pleural effusion is noted. The visualized skeletal structures are unremarkable. IMPRESSION: No active cardiopulmonary disease. Electronically Signed   By: Marijo Conception, M.D.   On: 01/06/2017 13:15    ____________________________________________   PROCEDURES  Procedure(s) performed: None  Procedures  Critical Care performed: No ____________________________________________   INITIAL IMPRESSION / ASSESSMENT AND PLAN / ED COURSE  Pertinent labs & imaging results that were available during my care of the patient were reviewed by me and considered in my medical decision making (see chart for details).  56 y.o. male with prostate cancer that is metastatic to the liver and right rib,  T-spine, presenting with right lateral chest wall pain. The patient does not have any infectious symptoms at this time, is afebrile. He is not immunocompromised as he is not currently on chemotherapy. He does have a mild elevation in his white blood cell count. There is no focal consolidation on his chest x-ray. The patient recently underwent PET scan 12/30/16, which does show increasing metastatic disease, and this is likely the cause of his pain. It may be bony, or pain with his liver although he has no pain in the soft tissues of the right upper quadrant. There is no evidence that the patient has ACS or MI, his EKG does not show ischemic changes and he has a negative troponin. At this time, I'll treat the patient with a Lidoderm patch and a Percocet in the emergency department. I spoke with Dr. Janese Banks, the oncologist on-call for Dr. Grayland Ormond, who recommends discharging the patient with prescriptions for both of these to last 5-7 days until the patient is able to be seen in clinic by Dr. Grayland Ormond. At this time, the patient is stable for discharge, return per cautions as well  as follow-up instructions were discussed.  ____________________________________________  FINAL CLINICAL IMPRESSION(S) / ED DIAGNOSES  Final diagnoses:  Cancer-related pain         NEW MEDICATIONS STARTED DURING THIS VISIT:  New Prescriptions   No medications on file      Eula Listen, MD 01/06/17 1452

## 2017-01-06 NOTE — ED Triage Notes (Addendum)
Pt to ed with c/o pain in right side rib area.  Pt states he was recently treated for pneumonia (5/23) and has been coughing nonstop. Pt states last night he was sitting on his couch and felt a sharp pain in right rib area.  Pt with history of prostate cancer but states he has not been able to schedule an appt with dr Marcos Eke yet.  Pt  Reports pain is the worst he has ever felt.  Pt recently had bone scan done.  Results noted in chart to be progressive metastatic disease.

## 2017-01-06 NOTE — Discharge Instructions (Signed)
Please use the Lidoderm patch as directed. You may take Motrin or Aleve for mild to moderate pain. Do not take Tylenol for pain as this can worsen your liver function. You may take oxycodone for severe pain that does not improve with Motrin or Aleve.  Please make an appointment to see Dr. Grayland Ormond in the next 5-7 days. Return to the emergency department if you develop shortness of breath, chest pain, lightheadedness or fainting, fever, cough, or any other symptoms concerning to you.

## 2017-01-13 NOTE — Progress Notes (Signed)
[place]  [phone]) [phone]  Fax:([phone]) [place] DOB: [birth year]  MR#: [id number]  TIR#:[id number]  Patient Care Team: Patient, No Pcp Per as PCP - General (General Practice) Clent Jacks, RN as Registered Nurse  CHIEF COMPLAINT:  Stage IV prostate cancer Gleason grade 8 with metastases to liver and bone.  INTERVAL HISTORY:  Patient returns to clinic today as an add-on continued right flank pain and discussion of his imaging results. He was recently evaluated in the ER with the same complaint. He otherwise feels well and and is asymptomatic. He continues to tolerate Zytiga well. He does not take prednisone regularly as prescribed. He continues to refuse any other interventions. He has no neurologic complaints. He denies any other pain. He has a good appetite and denies weight loss. He denies any recent fevers or illnesses. He denies any nausea, vomiting, constipation, or diarrhea. He does admit to urinary frequency especially at night, but no other urinary complaints. Patient otherwise feels well and offers no further specific complaints.   REVIEW OF SYSTEMS:   Review of Systems  Constitutional: Negative.  Negative for fever, malaise/fatigue and weight loss.  Respiratory: Negative.  Negative for cough and shortness of breath.   Cardiovascular: Negative.  Negative for chest pain and leg swelling.  Gastrointestinal: Negative.  Negative for abdominal pain.  Genitourinary: Positive for flank pain and frequency.  Skin: Negative.  Negative for rash.  Neurological: Negative.  Negative for sensory change and weakness.  Psychiatric/Behavioral: Negative.  The patient is not nervous/anxious.     As per HPI. Otherwise, a complete review of systems is negative.  ONCOLOGY HISTORY: Oncology History   Carcinoma prostate adenocarcinoma in multiple fragments.  Gleason grade 8.  Involving neutral space.  Large duct and S cylinder-type.  Poorly  differentiated.  Lateral neck involvement.Marland Kitchen PSAs low 2.4.  CT scan of abdomen was abnormal with multiple liver lesions.  Diagnosis in April of 2016 Clinical staging T1 cN0 M1 stage IV disease 2, CT-guided liver biopsies consistent with metastases from the prostate cancer   3.  Patient refused any chemotherapy or Lupron Started on ZYTIGA and prednisone from May of 2016     Prostate cancer metastatic to bone [facility])   12/09/2014 Initial Diagnosis    Cancer of prostate       PAST MEDICAL HISTORY: Past Medical History:  Diagnosis Date  . Cancer of prostate ([place]) 12/09/2014  . Neuromuscular disorder ([place])    brain tumor at 56 years of age  . Stroke ([place])    at 56 years of age    PAST SURGICAL HISTORY: No past surgical history on file.  FAMILY HISTORY: Reviewed and unchanged. No reported history of malignancy or chronic disease.  ADVANCED DIRECTIVES:    HEALTH MAINTENANCE: Social History  Substance Use Topics  . Smoking status: Current Every Day Smoker    Packs/day: 0.50    Types: Cigarettes  . Smokeless tobacco: Never Used  . Alcohol use No      No Known Allergies  Current Outpatient Prescriptions  Medication Sig Dispense Refill  . abiraterone Acetate (ZYTIGA) 250 MG tablet Take 4 tablets (1,000 mg total) by mouth daily. 120 tablet 3  . lidocaine (LIDODERM) 5 % Place 1 patch onto the skin every 12 (twelve) hours. Remove & Discard patch within 12 hours or as directed by MD 10 patch 0  . oxyCODONE (ROXICODONE) 5 MG immediate release tablet Take 1-2 tablets (5-10 mg total) by  mouth every 4 (four) hours as needed. 30 tablet 0  . potassium chloride SA (K-DUR,KLOR-CON) 20 MEQ tablet Take 1 tablet (20 mEq total) by mouth daily. 30 tablet 3   No current facility-administered medications for this visit.     OBJECTIVE: BP (!) 167/104   Pulse 85   Temp 97.5 F (36.4 C) (Tympanic)   Resp 20   Wt 159 lb 6.4 oz (72.3 kg)   BMI 24.97 kg/m    Body mass index is 24.97 kg/m.     ECOG FS:0 - Asymptomatic  General: Well-developed, well-nourished, no acute distress. Lungs: Clear to auscultation bilaterally. Heart: Regular rate and rhythm. No rubs, murmurs, or gallops.e. Abdomen: Soft, nontender, nondistended. No organomegaly noted, normoactive bowel sounds. Musculoskeletal: No edema, cyanosis, or clubbing. Mild palpation to tenderness on right flank. Neuro: Alert, answering all questions appropriately. Cranial nerves grossly intact. Skin: No rashes or petechiae noted. Psych: Normal affect. Lymphatics: No cervical, clavicular LAD.   LAB RESULTS:  CBC    Component Value Date/Time   WBC 14.6 (H) 01/06/2017 1234   RBC 4.96 01/06/2017 1234   HGB 16.0 01/06/2017 1234   HGB 15.1 11/08/2014 1009   HCT 45.1 01/06/2017 1234   HCT 44.5 11/08/2014 1009   PLT 266 01/06/2017 1234   PLT 250 11/08/2014 1009   MCV 91.0 01/06/2017 1234   MCV 92 11/08/2014 1009   MCH 32.2 01/06/2017 1234   MCHC 35.4 01/06/2017 1234   RDW 14.2 01/06/2017 1234   RDW 13.2 11/08/2014 1009   LYMPHSABS 2.3 12/16/2016 1015   LYMPHSABS 2.0 11/08/2014 1009   MONOABS 0.8 12/16/2016 1015   MONOABS 0.8 11/08/2014 1009   EOSABS 0.1 12/16/2016 1015   EOSABS 0.1 11/08/2014 1009   BASOSABS 0.0 12/16/2016 1015   BASOSABS 0.1 11/08/2014 1009   BMET    Component Value Date/Time   NA 136 01/06/2017 1234   NA 134 (L) 11/08/2014 1009   K 3.1 (L) 01/06/2017 1234   K 3.7 11/08/2014 1009   CL 99 (L) 01/06/2017 1234   CL 105 11/08/2014 1009   CO2 29 01/06/2017 1234   CO2 22 11/08/2014 1009   GLUCOSE 102 (H) 01/06/2017 1234   GLUCOSE 111 (H) 11/08/2014 1009   BUN 12 01/06/2017 1234   BUN 12 11/08/2014 1009   CREATININE 0.70 01/06/2017 1234   CREATININE 0.59 (L) 11/08/2014 1009   CALCIUM 9.4 01/06/2017 1234   CALCIUM 9.2 11/08/2014 1009   GFRNONAA >60 01/06/2017 1234   GFRNONAA >60 11/08/2014 1009   GFRAA >60 01/06/2017 1234   GFRAA >60 11/08/2014 1009   Lab Results  Component Value Date    PSA 3.90 12/16/2016     STUDIES: No results found.  ASSESSMENT:  Stage IV prostate cancer Gleason grade 8 with metastases to liver and bone.  PLAN:    1. Stage IV prostate cancer Gleason grade 8 with metastases to liver and bone: Bone scan results from December 30, 2016 reviewed independently with slightly progressive disease particularly in his right ribs consistent with his current complaint of right flank pain. Patient was given a referral to radiation oncology for palliative treatment.  Patient has previously refused any antiestrogen therapy, Lupron, or chemotherapy with Taxotere. Patient also refused any radiation therapy or xgeva. He is currently on Zytiga, but is unclear his compliance and the actual dose he is taking. He only takes his prednisone occasionally.  PSA continues to trend up. Return to clinic as previously scheduled.  2. Bony lesions: Patient  is refusing Xgeva or Zometa. He is willing to reconsider XRT as above. 3. Hypokalemia: Patient's potassium remains persistently low. He refuses to take oral or IV supplementation. 4. Urinary frequency: Likely secondary to enlarged prostate. Patient has Flomax listed in his medications, but is unclear his compliance.  A referral was previously made to urology. Urinalysis from today is negative. 5. Flank pain: Radiation oncology referral as above. Patient was also given a prescription for oxycodone 5 mg tabs today.   Patient expressed understanding and was in agreement with this plan. He also understands that He can call clinic at any time with any questions, concerns, or complaints.    Cancer of prostate [facility])   Staging form: Prostate, AJCC 7th Edition     Clinical: Stage IV (T1c, N0, M1, PSA: Less than 10, Gleason 8-10 - Poorly differentiated/undifferentiated (marked anaplasia)) - Signed by Forest Gleason, MD on 12/09/2014   [name], MD   01/16/2017 2:11 PM

## 2017-01-14 ENCOUNTER — Inpatient Hospital Stay: Payer: Medicare Other

## 2017-01-14 ENCOUNTER — Inpatient Hospital Stay: Payer: Medicare Other | Attending: Oncology | Admitting: Oncology

## 2017-01-14 VITALS — BP 167/104 | HR 85 | Temp 97.5°F | Resp 20 | Wt 159.4 lb

## 2017-01-14 DIAGNOSIS — C7951 Secondary malignant neoplasm of bone: Secondary | ICD-10-CM | POA: Diagnosis not present

## 2017-01-14 DIAGNOSIS — Z85841 Personal history of malignant neoplasm of brain: Secondary | ICD-10-CM | POA: Diagnosis not present

## 2017-01-14 DIAGNOSIS — E876 Hypokalemia: Secondary | ICD-10-CM | POA: Diagnosis not present

## 2017-01-14 DIAGNOSIS — Z79899 Other long term (current) drug therapy: Secondary | ICD-10-CM | POA: Diagnosis not present

## 2017-01-14 DIAGNOSIS — G709 Myoneural disorder, unspecified: Secondary | ICD-10-CM | POA: Diagnosis not present

## 2017-01-14 DIAGNOSIS — Z8673 Personal history of transient ischemic attack (TIA), and cerebral infarction without residual deficits: Secondary | ICD-10-CM | POA: Diagnosis not present

## 2017-01-14 DIAGNOSIS — C787 Secondary malignant neoplasm of liver and intrahepatic bile duct: Secondary | ICD-10-CM | POA: Diagnosis not present

## 2017-01-14 DIAGNOSIS — R109 Unspecified abdominal pain: Secondary | ICD-10-CM | POA: Diagnosis not present

## 2017-01-14 DIAGNOSIS — C61 Malignant neoplasm of prostate: Secondary | ICD-10-CM

## 2017-01-14 LAB — URINALYSIS, COMPLETE (UACMP) WITH MICROSCOPIC
BILIRUBIN URINE: NEGATIVE
Bacteria, UA: NONE SEEN
GLUCOSE, UA: NEGATIVE mg/dL
KETONES UR: NEGATIVE mg/dL
LEUKOCYTES UA: NEGATIVE
Nitrite: NEGATIVE
SQUAMOUS EPITHELIAL / LPF: NONE SEEN
Specific Gravity, Urine: 1.02 (ref 1.005–1.030)
pH: 6 (ref 5.0–8.0)

## 2017-01-14 MED ORDER — OXYCODONE HCL 5 MG PO TABS: 5.0000 mg | ORAL_TABLET | ORAL | 0 refills | Status: DC | PRN

## 2017-01-14 NOTE — Progress Notes (Signed)
Patient reports being seen in the ER for pain, reports broken rib. Patient reports pain is 10/10 today, given RX for oxycodone.

## 2017-01-15 LAB — URINE CULTURE: CULTURE: NO GROWTH

## 2017-01-22 ENCOUNTER — Encounter: Payer: Self-pay | Admitting: Radiation Oncology

## 2017-01-22 ENCOUNTER — Ambulatory Visit
Admission: RE | Admit: 2017-01-22 | Discharge: 2017-01-22 | Disposition: A | Discharge status: Home / Self Care | Payer: Medicare Other | Source: Ambulatory Visit | Attending: Radiation Oncology | Admitting: Radiation Oncology

## 2017-01-22 VITALS — BP 160/99 | HR 98 | Temp 96.8°F | Resp 20 | Wt 159.4 lb

## 2017-01-22 DIAGNOSIS — Z510 Encounter for antineoplastic radiation therapy: Secondary | ICD-10-CM | POA: Insufficient documentation

## 2017-01-22 DIAGNOSIS — Z79899 Other long term (current) drug therapy: Secondary | ICD-10-CM | POA: Diagnosis not present

## 2017-01-22 DIAGNOSIS — C61 Malignant neoplasm of prostate: Secondary | ICD-10-CM | POA: Diagnosis not present

## 2017-01-22 DIAGNOSIS — G893 Neoplasm related pain (acute) (chronic): Secondary | ICD-10-CM | POA: Insufficient documentation

## 2017-01-22 DIAGNOSIS — C7951 Secondary malignant neoplasm of bone: Secondary | ICD-10-CM | POA: Insufficient documentation

## 2017-01-22 DIAGNOSIS — F17210 Nicotine dependence, cigarettes, uncomplicated: Secondary | ICD-10-CM | POA: Diagnosis not present

## 2017-01-22 DIAGNOSIS — G709 Myoneural disorder, unspecified: Secondary | ICD-10-CM | POA: Diagnosis not present

## 2017-01-22 NOTE — Consult Note (Signed)
NEW PATIENT EVALUATION  Name: Roger Sparks.  MRN: [id number]  Date:   01/22/2017     DOB: 06-Apr-1961   This 56 y.o. male patient presents to the clinic for initial evaluation of stage IV prostate cancer with bone scan positivity and thoracic spine causing referred pain to his chest wall and peripheral ribs..  REFERRING PHYSICIAN: No ref. provider found  CHIEF COMPLAINT:  Chief Complaint  Patient presents with  . Cancer    Pt is here for initial consult of bone metastasis.      DIAGNOSIS: The encounter diagnosis was Bone metastasis ([place]).   PREVIOUS INVESTIGATIONS:  Bone scan and CT scans reviewed Pathology report reviewed Clinical notes reviewed  HPI: Patient is a 56 year old male who has presented with increasing right flank pain. He is currently on Zytiga as well as prednisone although he takes it intermittently. He initially presented with liver metastasis CT-guided biopsy showed adenocarcinoma consistent with prostate origin. Bone scan was performed showing abnormality in the right mid thoracic spine and right posterior rib at the T7 level level consistent with metastatic involvement. Patient passes refused antiestrogen therapy. I been asked to die with the patient for possible palliative radiation therapy to his thoracic spine. He's currently on narcotic analgesics for pain. He is ambulating well.  PLANNED TREATMENT REGIMEN: Palliative radiation therapy centering around the T7 vertebral body  PAST MEDICAL HISTORY:  has a past medical history of Cancer of prostate ([place]) (12/09/2014); Neuromuscular disorder ([place]); and Stroke ([place]).    PAST SURGICAL HISTORY: History reviewed. No pertinent surgical history.  FAMILY HISTORY: family history is not on file.  SOCIAL HISTORY:  reports that he has been smoking Cigarettes.  He has been smoking about 0.50 packs per day. He has never used smokeless tobacco. He reports that he does not drink alcohol or use drugs.  ALLERGIES:  Patient has no known allergies.  MEDICATIONS:  Current Outpatient Prescriptions  Medication Sig Dispense Refill  . abiraterone Acetate (ZYTIGA) 250 MG tablet Take 4 tablets (1,000 mg total) by mouth daily. 120 tablet 3  . lidocaine (LIDODERM) 5 % Place 1 patch onto the skin every 12 (twelve) hours. Remove & Discard patch within 12 hours or as directed by MD 10 patch 0  . oxyCODONE (ROXICODONE) 5 MG immediate release tablet Take 1-2 tablets (5-10 mg total) by mouth every 4 (four) hours as needed. 30 tablet 0  . potassium chloride SA (K-DUR,KLOR-CON) 20 MEQ tablet Take 1 tablet (20 mEq total) by mouth daily. (Patient not taking: Reported on 01/22/2017) 30 tablet 3   No current facility-administered medications for this encounter.     ECOG PERFORMANCE STATUS:  1 - Symptomatic but completely ambulatory  REVIEW OF SYSTEMS: Except for the narcotic dependent pain in the right flank Patient denies any weight loss, fatigue, weakness, fever, chills or night sweats. Patient denies any loss of vision, blurred vision. Patient denies any ringing  of the ears or hearing loss. No irregular heartbeat. Patient denies heart murmur or history of fainting. Patient denies any chest pain or pain radiating to her upper extremities. Patient denies any shortness of breath, difficulty breathing at night, cough or hemoptysis. Patient denies any swelling in the lower legs. Patient denies any nausea vomiting, vomiting of blood, or coffee ground material in the vomitus. Patient denies any stomach pain. Patient states has had normal bowel movements no significant constipation or diarrhea. Patient denies any dysuria, hematuria or significant nocturia. Patient denies any problems walking, swelling in the joints  or loss of balance. Patient denies any skin changes, loss of hair or loss of weight. Patient denies any excessive worrying or anxiety or significant depression. Patient denies any problems with insomnia. Patient denies  excessive thirst, polyuria, polydipsia. Patient denies any swollen glands, patient denies easy bruising or easy bleeding. Patient denies any recent infections, allergies or URI. Patient "s visual fields have not changed significantly in recent time.    PHYSICAL EXAM: BP (!) 160/99   Pulse 98   Temp (!) 96.8 F (36 C)   Resp 20   Wt 159 lb 6.3 oz (72.3 kg)   BMI 24.96 kg/m  Patient does have some decreased strength in the right upper extremity most likely rated to his neuromuscular disorder with a brain tumor at age 25 years of age. He also had a stroke at 56 years of age. Well-developed well-nourished patient in NAD. HEENT reveals PERLA, EOMI, discs not visualized.  Oral cavity is clear. No oral mucosal lesions are identified. Neck is clear without evidence of cervical or supraclavicular adenopathy. Lungs are clear to A&P. Cardiac examination is essentially unremarkable with regular rate and rhythm without murmur rub or thrill. Abdomen is benign with no organomegaly or masses noted. Motor sensory and DTR levels are equal and symmetric in the upper and lower extremities. Cranial nerves II through XII are grossly intact. Proprioception is intact. No peripheral adenopathy or edema is identified. No motor or sensory levels are noted. Crude visual fields are within normal range.  LABORATORY DATA: Pathology reports reviewed    RADIOLOGY RESULTS: Bone scan and CT scans reviewed   IMPRESSION: Stage IV prostate cancer with thoracic spine involvement in 56 year old male  PLAN: At this time I to go ahead with a course of palliative radiation therapy to his thoracic spine. We'll centered around the T7 vertebral bodies and treat from T5-T9 inclusive. Would plan on delivering 3000 cGy in 10 fractions. I personally set up and ordered CT simulation on the patient. Risks and benefits of treatment including possible dysphasia fatigue alteration of blood counts and skin reaction all were discussed in detail with  the patient. He seems to comprehend my treatment plan well. Family members were present during the discussion.  I would like to take this opportunity to thank you for allowing me to participate in the care of your patient.Armstead Peaks., MD

## 2017-01-27 ENCOUNTER — Ambulatory Visit
Admission: RE | Admit: 2017-01-27 | Discharge: 2017-01-27 | Disposition: A | Discharge status: Home / Self Care | Payer: Medicare Other | Source: Ambulatory Visit | Attending: Radiation Oncology | Admitting: Radiation Oncology

## 2017-01-27 ENCOUNTER — Other Ambulatory Visit: Payer: Self-pay | Admitting: *Deleted

## 2017-01-27 DIAGNOSIS — Z510 Encounter for antineoplastic radiation therapy: Secondary | ICD-10-CM | POA: Diagnosis not present

## 2017-01-27 DIAGNOSIS — F17210 Nicotine dependence, cigarettes, uncomplicated: Secondary | ICD-10-CM | POA: Diagnosis not present

## 2017-01-27 DIAGNOSIS — G709 Myoneural disorder, unspecified: Secondary | ICD-10-CM | POA: Diagnosis not present

## 2017-01-27 DIAGNOSIS — C7951 Secondary malignant neoplasm of bone: Principal | ICD-10-CM

## 2017-01-27 DIAGNOSIS — C61 Malignant neoplasm of prostate: Secondary | ICD-10-CM | POA: Diagnosis not present

## 2017-01-27 DIAGNOSIS — G893 Neoplasm related pain (acute) (chronic): Secondary | ICD-10-CM | POA: Diagnosis not present

## 2017-01-27 MED ORDER — OXYCODONE HCL 5 MG PO TABS: 5.0000 mg | ORAL_TABLET | ORAL | 0 refills | Status: DC | PRN

## 2017-02-02 DIAGNOSIS — G709 Myoneural disorder, unspecified: Secondary | ICD-10-CM | POA: Diagnosis not present

## 2017-02-02 DIAGNOSIS — C61 Malignant neoplasm of prostate: Secondary | ICD-10-CM | POA: Diagnosis not present

## 2017-02-02 DIAGNOSIS — C7951 Secondary malignant neoplasm of bone: Secondary | ICD-10-CM | POA: Diagnosis not present

## 2017-02-02 DIAGNOSIS — G893 Neoplasm related pain (acute) (chronic): Secondary | ICD-10-CM | POA: Diagnosis not present

## 2017-02-02 DIAGNOSIS — Z510 Encounter for antineoplastic radiation therapy: Secondary | ICD-10-CM | POA: Diagnosis not present

## 2017-02-02 DIAGNOSIS — F17210 Nicotine dependence, cigarettes, uncomplicated: Secondary | ICD-10-CM | POA: Diagnosis not present

## 2017-02-04 ENCOUNTER — Ambulatory Visit
Admission: RE | Admit: 2017-02-04 | Discharge: 2017-02-04 | Disposition: A | Discharge status: Home / Self Care | Payer: Medicare Other | Source: Ambulatory Visit | Attending: Radiation Oncology | Admitting: Radiation Oncology

## 2017-02-04 DIAGNOSIS — F17210 Nicotine dependence, cigarettes, uncomplicated: Secondary | ICD-10-CM | POA: Diagnosis not present

## 2017-02-04 DIAGNOSIS — C7951 Secondary malignant neoplasm of bone: Secondary | ICD-10-CM | POA: Diagnosis not present

## 2017-02-04 DIAGNOSIS — G709 Myoneural disorder, unspecified: Secondary | ICD-10-CM | POA: Diagnosis not present

## 2017-02-04 DIAGNOSIS — Z510 Encounter for antineoplastic radiation therapy: Secondary | ICD-10-CM | POA: Diagnosis not present

## 2017-02-04 DIAGNOSIS — C61 Malignant neoplasm of prostate: Secondary | ICD-10-CM | POA: Diagnosis not present

## 2017-02-04 DIAGNOSIS — G893 Neoplasm related pain (acute) (chronic): Secondary | ICD-10-CM | POA: Diagnosis not present

## 2017-02-05 ENCOUNTER — Ambulatory Visit
Admission: RE | Admit: 2017-02-05 | Discharge: 2017-02-05 | Disposition: A | Discharge status: Home / Self Care | Payer: Medicare Other | Source: Ambulatory Visit | Attending: Radiation Oncology | Admitting: Radiation Oncology

## 2017-02-05 DIAGNOSIS — Z510 Encounter for antineoplastic radiation therapy: Secondary | ICD-10-CM | POA: Diagnosis not present

## 2017-02-05 DIAGNOSIS — G893 Neoplasm related pain (acute) (chronic): Secondary | ICD-10-CM | POA: Diagnosis not present

## 2017-02-05 DIAGNOSIS — F17210 Nicotine dependence, cigarettes, uncomplicated: Secondary | ICD-10-CM | POA: Diagnosis not present

## 2017-02-05 DIAGNOSIS — C7951 Secondary malignant neoplasm of bone: Secondary | ICD-10-CM | POA: Diagnosis not present

## 2017-02-05 DIAGNOSIS — G709 Myoneural disorder, unspecified: Secondary | ICD-10-CM | POA: Diagnosis not present

## 2017-02-05 DIAGNOSIS — C61 Malignant neoplasm of prostate: Secondary | ICD-10-CM | POA: Diagnosis not present

## 2017-02-06 ENCOUNTER — Ambulatory Visit
Admission: RE | Admit: 2017-02-06 | Discharge: 2017-02-06 | Disposition: A | Discharge status: Home / Self Care | Payer: Medicare Other | Source: Ambulatory Visit | Attending: Radiation Oncology | Admitting: Radiation Oncology

## 2017-02-06 DIAGNOSIS — G893 Neoplasm related pain (acute) (chronic): Secondary | ICD-10-CM | POA: Diagnosis not present

## 2017-02-06 DIAGNOSIS — Z510 Encounter for antineoplastic radiation therapy: Secondary | ICD-10-CM | POA: Diagnosis not present

## 2017-02-06 DIAGNOSIS — C61 Malignant neoplasm of prostate: Secondary | ICD-10-CM | POA: Diagnosis not present

## 2017-02-06 DIAGNOSIS — G709 Myoneural disorder, unspecified: Secondary | ICD-10-CM | POA: Diagnosis not present

## 2017-02-06 DIAGNOSIS — C7951 Secondary malignant neoplasm of bone: Secondary | ICD-10-CM | POA: Diagnosis not present

## 2017-02-06 DIAGNOSIS — F17210 Nicotine dependence, cigarettes, uncomplicated: Secondary | ICD-10-CM | POA: Diagnosis not present

## 2017-02-08 ENCOUNTER — Emergency Department: Payer: Medicare Other

## 2017-02-08 ENCOUNTER — Telehealth: Payer: Self-pay | Admitting: Urology

## 2017-02-08 ENCOUNTER — Emergency Department
Admission: EM | Admit: 2017-02-08 | Discharge: 2017-02-08 | Disposition: A | Discharge status: Home / Self Care | Payer: Medicare Other | Attending: Emergency Medicine | Admitting: Emergency Medicine

## 2017-02-08 DIAGNOSIS — F17210 Nicotine dependence, cigarettes, uncomplicated: Secondary | ICD-10-CM | POA: Diagnosis not present

## 2017-02-08 DIAGNOSIS — Z79899 Other long term (current) drug therapy: Secondary | ICD-10-CM | POA: Insufficient documentation

## 2017-02-08 DIAGNOSIS — C7951 Secondary malignant neoplasm of bone: Secondary | ICD-10-CM | POA: Diagnosis not present

## 2017-02-08 DIAGNOSIS — C61 Malignant neoplasm of prostate: Secondary | ICD-10-CM | POA: Insufficient documentation

## 2017-02-08 DIAGNOSIS — N320 Bladder-neck obstruction: Secondary | ICD-10-CM | POA: Diagnosis not present

## 2017-02-08 DIAGNOSIS — R109 Unspecified abdominal pain: Secondary | ICD-10-CM | POA: Diagnosis present

## 2017-02-08 LAB — URINALYSIS, COMPLETE (UACMP) WITH MICROSCOPIC
BILIRUBIN URINE: NEGATIVE
Bacteria, UA: NONE SEEN
GLUCOSE, UA: NEGATIVE mg/dL
Ketones, ur: NEGATIVE mg/dL
LEUKOCYTES UA: NEGATIVE
Nitrite: NEGATIVE
PH: 7 (ref 5.0–8.0)
Protein, ur: NEGATIVE mg/dL
SQUAMOUS EPITHELIAL / LPF: NONE SEEN
Specific Gravity, Urine: 1.004 — ABNORMAL LOW (ref 1.005–1.030)

## 2017-02-08 LAB — CBC
HCT: 44.4 % (ref 40.0–52.0)
HEMOGLOBIN: 15.1 g/dL (ref 13.0–18.0)
MCH: 31.6 pg (ref 26.0–34.0)
MCHC: 33.9 g/dL (ref 32.0–36.0)
MCV: 93.1 fL (ref 80.0–100.0)
Platelets: 288 10*3/uL (ref 150–440)
RBC: 4.76 MIL/uL (ref 4.40–5.90)
RDW: 14.2 % (ref 11.5–14.5)
WBC: 15.8 10*3/uL — ABNORMAL HIGH (ref 3.8–10.6)

## 2017-02-08 LAB — HEPATIC FUNCTION PANEL
ALK PHOS: 160 U/L — AB (ref 38–126)
ALT: 12 U/L — AB (ref 17–63)
AST: 20 U/L (ref 15–41)
Albumin: 4.2 g/dL (ref 3.5–5.0)
BILIRUBIN TOTAL: 0.6 mg/dL (ref 0.3–1.2)
Bilirubin, Direct: <0.1 mg/dL — ABNORMAL LOW (ref 0.1–0.5)
Total Protein: 7.9 g/dL (ref 6.5–8.1)

## 2017-02-08 LAB — LIPASE, BLOOD: Lipase: 18 U/L (ref 11–51)

## 2017-02-08 LAB — BASIC METABOLIC PANEL
ANION GAP: 11 (ref 5–15)
BUN: 7 mg/dL (ref 6–20)
CO2: 25 mmol/L (ref 22–32)
Calcium: 9.3 mg/dL (ref 8.9–10.3)
Chloride: 100 mmol/L — ABNORMAL LOW (ref 101–111)
Creatinine, Ser: 0.64 mg/dL (ref 0.61–1.24)
GFR calc non Af Amer: >60 mL/min (ref >60)
GLUCOSE: 111 mg/dL — AB (ref 65–99)
Potassium: 3.3 mmol/L — ABNORMAL LOW (ref 3.5–5.1)
Sodium: 136 mmol/L (ref 135–145)

## 2017-02-08 MED ORDER — TAMSULOSIN HCL 0.4 MG PO CAPS: 0.4000 mg | ORAL_CAPSULE | Freq: Every day | ORAL | 0 refills | Status: AC

## 2017-02-08 MED ORDER — LIDOCAINE HCL 2 % EX GEL
1.0000 "application " | CUTANEOUS | Status: DC
  Filled 2017-02-08: qty 5

## 2017-02-08 MED ORDER — ONDANSETRON HCL 4 MG/2ML IJ SOLN
4.0000 mg | Freq: Once | INTRAMUSCULAR | Status: AC
  Administered 2017-02-08: 4 mg via INTRAVENOUS
  Filled 2017-02-08: qty 2

## 2017-02-08 MED ORDER — TAMSULOSIN HCL 0.4 MG PO CAPS
0.4000 mg | ORAL_CAPSULE | ORAL | Status: AC
  Administered 2017-02-08: 0.4 mg via ORAL
  Filled 2017-02-08: qty 1

## 2017-02-08 MED ORDER — SODIUM CHLORIDE 0.9 % IV BOLUS (SEPSIS)
500.0000 mL | Freq: Once | INTRAVENOUS | Status: AC
  Administered 2017-02-08: 500 mL via INTRAVENOUS

## 2017-02-08 MED ORDER — LIDOCAINE HCL 2 % EX GEL
1.0000 "application " | Freq: Once | CUTANEOUS | Status: AC
  Administered 2017-02-08: 1 via URETHRAL

## 2017-02-08 MED ORDER — LIDOCAINE HCL 2 % EX GEL
CUTANEOUS | Status: AC
  Filled 2017-02-08: qty 10

## 2017-02-08 MED ORDER — MORPHINE SULFATE (PF) 4 MG/ML IV SOLN
6.0000 mg | Freq: Once | INTRAVENOUS | Status: AC
  Administered 2017-02-08: 6 mg via INTRAVENOUS
  Filled 2017-02-08: qty 2

## 2017-02-08 MED ORDER — MORPHINE SULFATE (PF) 2 MG/ML IV SOLN
2.0000 mg | Freq: Once | INTRAVENOUS | Status: AC
  Administered 2017-02-08: 2 mg via INTRAVENOUS
  Filled 2017-02-08: qty 1

## 2017-02-08 NOTE — ED Notes (Signed)
[place] razor knife locked up in safe, given to officer Price.

## 2017-02-08 NOTE — ED Provider Notes (Signed)
[facility] Emergency Department Provider Note   ____________________________________________   First MD Initiated Contact with Patient 02/08/17 0732     (approximate)  I have reviewed the triage vital signs and the nursing notes.   HISTORY  Chief Complaint Flank Pain    HPI Roger Sparks. is a 56 y.o. male experiencing pain in the lower abdomen worse on the right than on the left since last evening. He reports increasing pain feeling of fullness and inability urinate except for small amounts.  Reports severe and increasing pain over the lower abdomen and bladder. Some nausea but no vomiting. No fevers or chills. No dark or bloody urine, but reports very difficult to urinate more than just a very small amount  Has prostate cancer currently on radiation therapy. Reports he does not currently see a urologist   Past Medical History:  Diagnosis Date  . Cancer of prostate ([place]) 12/09/2014  . Neuromuscular disorder ([place])    brain tumor at 56 years of age  . Stroke [facility])    at 56 years of age    Patient Active Problem List   Diagnosis Date Noted  . Prostate cancer metastatic to bone ([place]) 12/09/2014  . Brain tumor, glioma ([place]) 07/16/2014  . Right arm weakness 07/16/2014  . Cerebral seizure 07/16/2014  . Other symptoms and signs involving the musculoskeletal system 07/16/2014  . Tear film insufficiency 11/10/2011  . 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  Prior to Admission medications   Medication Sig Start Date End Date Taking? Authorizing Provider  abiraterone Acetate (ZYTIGA) 250 MG tablet Take 4 tablets (1,000 mg total) by mouth daily. 04/09/16   [name], MD  lidocaine (LIDODERM) 5 % Place 1 patch onto the skin every 12 (twelve) hours. Remove & Discard patch within 12 hours or as directed by MD 01/06/17 01/06/18  Eula Listen, MD  oxyCODONE (ROXICODONE) 5 MG immediate release tablet Take  1-2 tablets (5-10 mg total) by mouth every 4 (four) hours as needed. 01/27/17 01/27/18  [name], MD  potassium chloride SA (K-DUR,KLOR-CON) 20 MEQ tablet Take 1 tablet (20 mEq total) by mouth daily. Patient not taking: Reported on 01/22/2017 04/17/16   [name], MD  tamsulosin (FLOMAX) 0.4 MG CAPS capsule Take 1 capsule (0.4 mg total) by mouth daily. 02/08/17   [name], MD    Allergies Patient has no known allergies.  No family history on file.  Social History Social History  Substance Use Topics  . Smoking status: Current Every Day Smoker    Packs/day: 0.50    Types: Cigarettes  . Smokeless tobacco: Never Used  . Alcohol use No    Review of Systems Constitutional: No fever/chills Eyes: No visual changes. ENT: No sore throat. Cardiovascular: Denies chest pain. Respiratory: Denies shortness of breath. Gastrointestinal:   No diarrhea.  No constipation. Genitourinary: See history of present illness Musculoskeletal: Negative for back pain. Skin: Negative for rash. Neurological: Negative for headaches, focal weakness or numbness.    ____________________________________________   PHYSICAL EXAM:  VITAL SIGNS: ED Triage Vitals  Enc Vitals Group     BP 02/08/17 0657 (!) 189/108     Pulse Rate 02/08/17 0655 95     Resp 02/08/17 0655 20     Temp 02/08/17 0655 97.6 F (36.4 C)     Temp Source 02/08/17 0655 Oral     SpO2 02/08/17 0655 98 %  Weight 02/08/17 0656 160 lb (72.6 kg)     Height 02/08/17 0656 5\' 6"  (1.676 m)     Head Circumference --      Peak Flow --      Pain Score 02/08/17 0655 10     Pain Loc --      Pain Edu? --      Excl. in [place]? --     Constitutional: Alert and oriented. Appears distress, unable take comfortable. Eyes: Conjunctivae are normal. Head: Atraumatic. Nose: No congestion/rhinnorhea. Mouth/Throat: Mucous membranes are moist. Neck: No stridor.   Cardiovascular: Normal rate, regular rhythm. Grossly normal heart sounds.   Good peripheral circulation. Respiratory: Normal respiratory effort.  No retractions. Lungs CTAB. Gastrointestinal: Soft and nontender except for mild tenderness across the right lower abdomen and likely distended ladder suprapubically. No peritonitis no rebound tenderness. The penis appears normal with normal scrotum and testicles are nontender. Musculoskeletal: No lower extremity tenderness nor edema. Neurologic:  Normal speech and language. No gross focal neurologic deficits are appreciated.  Skin:  Skin is warm, dry and intact. No rash noted. Psychiatric: Mood and affect are normal. Speech and behavior are normal.  ____________________________________________   LABS (all labs ordered are listed, but only abnormal results are displayed)  Labs Reviewed  URINALYSIS, COMPLETE (UACMP) WITH MICROSCOPIC - Abnormal; Notable for the following:       Result Value   Color, Urine STRAW (*)    APPearance CLEAR (*)    Specific Gravity, Urine 1.004 (*)    Hgb urine dipstick MODERATE (*)    All other components within normal limits  BASIC METABOLIC PANEL - Abnormal; Notable for the following:    Potassium 3.3 (*)    Chloride 100 (*)    Glucose, Bld 111 (*)    All other components within normal limits  CBC - Abnormal; Notable for the following:    WBC 15.8 (*)    All other components within normal limits  HEPATIC FUNCTION PANEL - Abnormal; Notable for the following:    ALT 12 (*)    Alkaline Phosphatase 160 (*)    Bilirubin, Direct <0.1 (*)    All other components within normal limits  LIPASE, BLOOD   ____________________________________________  EKG   ____________________________________________  RADIOLOGY  Ct Renal Stone Study  Result Date: 02/08/2017 CLINICAL DATA:  Known prostate cancer. RIGHT flank pain and testicular pain. EXAM: CT ABDOMEN AND PELVIS WITHOUT CONTRAST TECHNIQUE: Multidetector CT imaging of the abdomen and pelvis was performed following the standard protocol  without IV contrast. COMPARISON:  None. FINDINGS: Lower chest: RIGHT middle lobe pulmonary nodule, incompletely evaluated, but appears increased, 7 mm diameter. This is similar to prior CT chest. Hepatobiliary: Low-attenuation liver metastases better seen on CT abdomen and pelvis with contrast. Porcelain gallbladder. Pancreas: Unremarkable. No pancreatic ductal dilatation or surrounding inflammatory changes. Spleen: Normal in size without focal abnormality. Adrenals/Urinary Tract: BILATERAL hydronephrosis and hydroureter, worse on the RIGHT. No visible calculi. Markedly distended bladder. Irregular mass invading the bladder base representing known prostate cancer, increasingly convex superiorly compared with most recent priors. Stomach/Bowel: Stomach is within normal limits. Appendix appears normal. No evidence of bowel wall thickening, distention, or inflammatory changes. Vascular/Lymphatic: No significant vascular findings are present. No enlarged abdominal or pelvic lymph nodes. Reproductive: Interval growth of prostate carcinoma compared with most recent priors. Other: No abdominal wall hernia or abnormality. No abdominopelvic ascites. Musculoskeletal: No definite metastatic disease to the visualized lumbosacral region. Sclerotic lesions in the pelvis are redemonstrated, LEFT SI  joint region. IMPRESSION: BILATERAL hydronephrosis and hydroureter, worse on the RIGHT. Markedly distended bladder consistent with bladder outlet obstruction. Concern for interval growth prostate cancer. See discussion above. Electronically Signed   By: Staci Righter M.D.   On: 02/08/2017 09:13    ____________________________________________   PROCEDURES  Procedure(s) performed: None  Procedures  Critical Care performed: No  ____________________________________________   INITIAL IMPRESSION / ASSESSMENT AND PLAN / ED COURSE  Pertinent labs & imaging results that were available during my care of the patient were reviewed  by me and considered in my medical decision making (see chart for details).  Differential diagnosis includes but is not limited to, abdominal perforation, aortic dissection, cholecystitis, appendicitis, diverticulitis, colitis, esophagitis/gastritis, kidney stone, pyelonephritis, urinary tract infection, aortic aneurysm. All are considered in decision and treatment plan. Based upon the patient's presentation and risk factors, I suspect likely the patient suffering from either a kidney stone or bladder L obstruction. Proceed with CT.  ----------------------------------------- 9:26 AM on 02/08/2017 -----------------------------------------  Case and care discussed with Dr. Erlene Quan of urology, recommends Foley catheter placement and outpatient follow-up in their office this week. Start Flomax. Recommends against antibiotic at this time. We'll place Foley now.  Foley drained about 700 mL scleral urine. Patient reports all pain and symptoms resolved. Resting comfortably.  Return precautions and treatment recommendations and follow-up discussed with the patient who is agreeable with the plan.       ____________________________________________   FINAL CLINICAL IMPRESSION(S) / ED DIAGNOSES  Final diagnoses:  Bladder outflow obstruction      NEW MEDICATIONS STARTED DURING THIS VISIT:  New Prescriptions   TAMSULOSIN (FLOMAX) 0.4 MG CAPS CAPSULE    Take 1 capsule (0.4 mg total) by mouth daily.     Note:  This document was prepared using Dragon voice recognition software and may include unintentional dictation errors.     [name], MD 02/08/17 1004

## 2017-02-08 NOTE — ED Notes (Signed)
Waiting on officer to retrieve pts knife from safe.

## 2017-02-08 NOTE — ED Triage Notes (Signed)
Patient c/o right sided flank pain, testicular pain and urinary retention. Pt has hx of prostate cancer

## 2017-02-08 NOTE — ED Notes (Signed)
Pt resting comfortably at this time.

## 2017-02-08 NOTE — Telephone Encounter (Signed)
I was called about this patient from the ER.   Unfortunately, it appears that he has had progression of his metastatic prostate cancer now with outlet obstruction. His bladder was markedly distended on CT scan with associated right greater than left hydronephrosis but preserved renal function. It unclear whether his hydronephrosis is from pelvic lymphadenopathy over urinary retention.  I would like him to have a renal ultrasound mid week to assess for resolution of his hydronephrosis. If his hydronephrosis has resolved, he can learn clean intermittent catheterization.  I have ordered this study and would like to see him on Thursday morning.   Hollice Espy, MD

## 2017-02-08 NOTE — ED Notes (Signed)
Pt switched over to leg bag, instructed to follow up with urology.

## 2017-02-09 ENCOUNTER — Ambulatory Visit: Payer: Medicare Other

## 2017-02-10 ENCOUNTER — Ambulatory Visit: Payer: Medicare Other

## 2017-02-10 NOTE — Telephone Encounter (Signed)
I spoke to the patient's wife and called and made his appt for his RUS for Wednesday and his follow up app with you for Thursday at 8:30. They said that was really early for them but they would try and make it work.  Thanks,  Sharyn Lull

## 2017-02-11 ENCOUNTER — Ambulatory Visit: Payer: Medicare Other

## 2017-02-11 ENCOUNTER — Ambulatory Visit: Admission: RE | Admit: 2017-02-11 | Payer: Medicare Other | Source: Ambulatory Visit

## 2017-02-11 ENCOUNTER — Ambulatory Visit
Admission: RE | Admit: 2017-02-11 | Discharge: 2017-02-11 | Disposition: A | Discharge status: Home / Self Care | Payer: Medicare Other | Source: Ambulatory Visit | Attending: Urology | Admitting: Urology

## 2017-02-11 ENCOUNTER — Inpatient Hospital Stay: Payer: Medicare Other | Attending: Oncology

## 2017-02-11 DIAGNOSIS — C61 Malignant neoplasm of prostate: Secondary | ICD-10-CM | POA: Insufficient documentation

## 2017-02-11 DIAGNOSIS — Z9889 Other specified postprocedural states: Secondary | ICD-10-CM | POA: Insufficient documentation

## 2017-02-12 ENCOUNTER — Encounter: Payer: Self-pay | Admitting: Urology

## 2017-02-12 ENCOUNTER — Ambulatory Visit (INDEPENDENT_AMBULATORY_CARE_PROVIDER_SITE_OTHER): Payer: Medicare Other | Admitting: Urology

## 2017-02-12 ENCOUNTER — Ambulatory Visit: Payer: Medicare Other

## 2017-02-12 VITALS — BP 165/78 | HR 102 | Ht 66.0 in | Wt 156.0 lb

## 2017-02-12 DIAGNOSIS — R339 Retention of urine, unspecified: Secondary | ICD-10-CM

## 2017-02-12 DIAGNOSIS — Z91199 Patient's noncompliance with other medical treatment and regimen due to unspecified reason: Secondary | ICD-10-CM

## 2017-02-12 DIAGNOSIS — Z5329 Procedure and treatment not carried out because of patient's decision for other reasons: Secondary | ICD-10-CM | POA: Diagnosis not present

## 2017-02-12 DIAGNOSIS — C61 Malignant neoplasm of prostate: Secondary | ICD-10-CM

## 2017-02-12 DIAGNOSIS — R109 Unspecified abdominal pain: Secondary | ICD-10-CM | POA: Diagnosis not present

## 2017-02-12 NOTE — Progress Notes (Signed)
Catheter Removal  Patient is present today for a catheter removal.  36ml of water was drained from the balloon. A 16FR foley cath was removed from the bladder no complications were noted . Patient tolerated well.  Preformed by: Fonnie Jarvis, CMA  Continuous Intermittent Catheterization  Due to urinary retention patient is present today for a teaching of self I & O Catheterization. Patient was given detailed verbal and printed instructions of self catheterization. Patient was cleaned and prepped in a sterile fashion.  With instruction and assistance patient attempted to insert a cath with out success due to patient not having control of right arm. Patient's wife then was given instruction and inserted a 14 FR flex coude, patient has an enlarged prostate a straight cath cannot be passed, and urine return was noted 10 ml, urine was dark yellow in color. Patient tolerated well, no complications were noted Patient was given a sample bag with supplies to take home.  Instructions were given per Dr. Erlene Quan for patient to cath 4 times daily.  An order was placed with [facility] for catheters to be sent to the patient's home. Patient is to follow up with Cancer center  Preformed by: Fonnie Jarvis, CMA

## 2017-02-12 NOTE — Progress Notes (Signed)
02/12/2017 9:52 AM   Genevieve Norlander. Sep 23, 1960 [id number]  Referring provider: No referring provider defined for this encounter.  Chief Complaint  Patient presents with  . Results    RUS    HPI: 56 year old male with stage IV metastatic prostate cancer who presents today after recent ER visit found to be in urinary retention with bilateral hydroureteronephrosis.  He also complained of right flank pain.  His first diagnosis of prostate cancer, Gleason grade 8 with metastatic lesions to the liver and bone 2016. Since then, he is refused androgen deprivation therapy. He is currently being managed with Zytiga but does not take his prednisone regularly. He's refuse all other interventions Although more recently has agreed to palliative radiation.  On most recent imaging in the form of CT renal stone protocol on 02/08/2017, he's had progression of his local disease with growth of his prostate nonobstructing his outlet.  In addition, most recent bone scan on 12/30/2016 shows progression of his bone disease in his mid thoracic and right posterior rib.  He notes that over the past 1-2 months, he has had increased urinary frequency and sensation of incomplete bladder emptying. Upon present to some to the emergency room, he was in retention with bilateral hydronephrosis. Foley catheter was placed.  His creatinine was preserved, 0.64. Follow-up renal ultrasound performed yesterday after the Foley being in place 4 day shows resolution of his hydronephrosis.  Today, he reports that his catheter is draining well. He does have some penile irritation and is anxious to have the catheter removed. He does complain of right rib pain. This keeps him up at night. He is accompanied today by 2 male family members.     PMH: Past Medical History:  Diagnosis Date  . Cancer of prostate ([place]) 12/09/2014  . Neuromuscular disorder ([place])    brain tumor at 56 years of age  . Stroke [facility])    at 56 years of  age    Surgical History: No past surgical history on file.  Home Medications:  Allergies as of 02/12/2017   No Known Allergies     Medication List       Accurate as of 02/12/17  9:52 AM. Always use your most recent med list.          abiraterone Acetate 250 MG tablet Commonly known as:  ZYTIGA Take 4 tablets (1,000 mg total) by mouth daily.   oxyCODONE 5 MG immediate release tablet Commonly known as:  ROXICODONE Take 1-2 tablets (5-10 mg total) by mouth every 4 (four) hours as needed.   tamsulosin 0.4 MG Caps capsule Commonly known as:  FLOMAX Take 1 capsule (0.4 mg total) by mouth daily.       Allergies: No Known Allergies  Family History: No family history on file.  Social History:  reports that he has been smoking Cigarettes.  He has been smoking about 0.50 packs per day. He has never used smokeless tobacco. He reports that he does not drink alcohol or use drugs.  ROS: UROLOGY Frequent Urination?: No Hard to postpone urination?: No Burning/pain with urination?: Yes Get up at night to urinate?: No Leakage of urine?: No Urine stream starts and stops?: No Trouble starting stream?: No Do you have to strain to urinate?: No Blood in urine?: Yes Urinary tract infection?: No Sexually transmitted disease?: No Injury to kidneys or bladder?: No Painful intercourse?: No Weak stream?: No Erection problems?: No Penile pain?: No  Gastrointestinal Nausea?: No Vomiting?: No Indigestion/heartburn?: No Diarrhea?:  No Constipation?: No  Constitutional Fever: No Night sweats?: No Weight loss?: No Fatigue?: No  Skin Skin rash/lesions?: No Itching?: No  Eyes Blurred vision?: No Double vision?: No  Ears/Nose/Throat Sore throat?: No Sinus problems?: No  Hematologic/Lymphatic Swollen glands?: No Easy bruising?: No  Cardiovascular Leg swelling?: No Chest pain?: No  Respiratory Cough?: No Shortness of breath?: No  Endocrine Excessive thirst?:  No  Musculoskeletal Back pain?: No Joint pain?: No  Neurological Headaches?: No Dizziness?: No  Psychologic Depression?: No Anxiety?: No  Physical Exam: BP (!) 165/78   Pulse (!) 102   Ht 5\' 6"  (1.676 m)   Wt 156 lb (70.8 kg)   BMI 25.18 kg/m   Constitutional:  Alert and oriented, No acute distress. HEENT: [place] AT, moist mucus membranes.  Trachea midline, no masses. Cardiovascular: No clubbing, cyanosis, or edema. Respiratory: Normal respiratory effort, no increased work of breathing. GI: Abdomen is soft, nontender, nondistended, no abdominal masses GU: Foley catheter in place string clear yellow urine. Skin: No rashes, bruises or suspicious lesions. Neurologic: Right arm paralysis.   Psychiatric: Normal mood and affect.  Laboratory Data: Lab Results  Component Value Date   WBC 15.8 (H) 02/08/2017   HGB 15.1 02/08/2017   HCT 44.4 02/08/2017   MCV 93.1 02/08/2017   PLT 288 02/08/2017    Lab Results  Component Value Date   CREATININE 0.64 02/08/2017    Lab Results  Component Value Date   PSA 3.90 12/16/2016   PSA 1.17 09/18/2016   PSA 0.47 04/09/2016    Urinalysis    Component Value Date/Time   COLORURINE STRAW (A) 02/08/2017 0658   APPEARANCEUR CLEAR (A) 02/08/2017 0658   APPEARANCEUR Clear 06/27/2014 0103   LABSPEC 1.004 (L) 02/08/2017 0658   LABSPEC 1.025 06/27/2014 0103   PHURINE 7.0 02/08/2017 0658   GLUCOSEU NEGATIVE 02/08/2017 0658   GLUCOSEU 50 mg/dL 06/27/2014 0103   HGBUR MODERATE (A) 02/08/2017 0658   BILIRUBINUR NEGATIVE 02/08/2017 0658   BILIRUBINUR Negative 06/27/2014 0103   KETONESUR NEGATIVE 02/08/2017 0658   PROTEINUR NEGATIVE 02/08/2017 0658   NITRITE NEGATIVE 02/08/2017 0658   LEUKOCYTESUR NEGATIVE 02/08/2017 0658   LEUKOCYTESUR Negative 06/27/2014 0103    Pertinent Imaging: Renal ultrasound from 02/12/2015, CT renal stone protocol from 02/08/2017 as well as bone scan from 12/30/2016 personally today.  Assessment & Plan:     1. Prostate cancer [facility]) Metastatic prostate cancer dx 2016 has refused intervention other than Zytiga only with questionable compliance  2. Urinary retention Secondary to local progression of prostate cancer Hydronephrosis has resolved with the Foley catheter in place, etiology of hydronephrosis secondary to bladder outlet obstruction rather than ureteral obstruction Catheter was removed today and the patient's wife was able to demonstrate excellent clean intermittent catheterization technique which she will be performing at least 4 times a day indefinitely using a coud-type catheter due to his anatomy and outlet obstruction TURP of limited benefit at this time given the lack of prostate cancer treatment and optimization, he'll have regrowth and obstruction  3. Right flank pain Likely related to metastatic disease to the bone and spine Continue palliative radiation  4. Noncompliance by refusing intervention or support Lengthy and honest discussion today about patient's disease process He did not go to radiation treatments earlier this week secondary to pain, has difficulty understanding that his pain is caused by the cancer and that treatment will help his pain  I stressed the importance of compliance with all therapy Additionally, I stressed the importance of  willingness to change prostate cancer treatment regimens and be open to additional treatment options such as chemotherapy, hormonal therapy  Case was also personally discussed with Dr. Grayland Ormond today. He will help expedite follow-up to further this discussion.  Return in about 6 weeks (around 03/26/2017) for recheck.  Hollice Espy, MD  [facility] [address] [place], [place] [address] [phone]

## 2017-02-13 ENCOUNTER — Ambulatory Visit: Payer: Medicare Other

## 2017-02-16 ENCOUNTER — Ambulatory Visit
Admission: RE | Admit: 2017-02-16 | Discharge: 2017-02-16 | Disposition: A | Discharge status: Home / Self Care | Payer: Medicare Other | Source: Ambulatory Visit | Attending: Radiation Oncology | Admitting: Radiation Oncology

## 2017-02-16 DIAGNOSIS — Z510 Encounter for antineoplastic radiation therapy: Secondary | ICD-10-CM | POA: Diagnosis not present

## 2017-02-16 DIAGNOSIS — G709 Myoneural disorder, unspecified: Secondary | ICD-10-CM | POA: Diagnosis not present

## 2017-02-16 DIAGNOSIS — G893 Neoplasm related pain (acute) (chronic): Secondary | ICD-10-CM | POA: Diagnosis not present

## 2017-02-16 DIAGNOSIS — C7951 Secondary malignant neoplasm of bone: Secondary | ICD-10-CM | POA: Diagnosis not present

## 2017-02-16 DIAGNOSIS — F17210 Nicotine dependence, cigarettes, uncomplicated: Secondary | ICD-10-CM | POA: Diagnosis not present

## 2017-02-16 DIAGNOSIS — C61 Malignant neoplasm of prostate: Secondary | ICD-10-CM | POA: Diagnosis not present

## 2017-02-17 ENCOUNTER — Other Ambulatory Visit: Payer: Self-pay | Admitting: *Deleted

## 2017-02-17 ENCOUNTER — Ambulatory Visit
Admission: RE | Admit: 2017-02-17 | Discharge: 2017-02-17 | Disposition: A | Discharge status: Home / Self Care | Payer: Medicare Other | Source: Ambulatory Visit | Attending: Radiation Oncology | Admitting: Radiation Oncology

## 2017-02-17 ENCOUNTER — Telehealth: Payer: Self-pay | Admitting: *Deleted

## 2017-02-17 DIAGNOSIS — G709 Myoneural disorder, unspecified: Secondary | ICD-10-CM | POA: Diagnosis not present

## 2017-02-17 DIAGNOSIS — C7951 Secondary malignant neoplasm of bone: Secondary | ICD-10-CM | POA: Diagnosis not present

## 2017-02-17 DIAGNOSIS — G893 Neoplasm related pain (acute) (chronic): Secondary | ICD-10-CM | POA: Diagnosis not present

## 2017-02-17 DIAGNOSIS — C61 Malignant neoplasm of prostate: Secondary | ICD-10-CM

## 2017-02-17 DIAGNOSIS — F17210 Nicotine dependence, cigarettes, uncomplicated: Secondary | ICD-10-CM | POA: Diagnosis not present

## 2017-02-17 DIAGNOSIS — Z510 Encounter for antineoplastic radiation therapy: Secondary | ICD-10-CM | POA: Diagnosis not present

## 2017-02-17 MED ORDER — OXYCODONE HCL 5 MG PO TABS: 5.0000 mg | ORAL_TABLET | ORAL | 0 refills | Status: DC | PRN

## 2017-02-17 NOTE — Telephone Encounter (Signed)
Notified that patients prescription for Oxycodone is ready for pick up.

## 2017-02-18 ENCOUNTER — Ambulatory Visit
Admission: RE | Admit: 2017-02-18 | Discharge: 2017-02-18 | Disposition: A | Discharge status: Home / Self Care | Payer: Medicare Other | Source: Ambulatory Visit | Attending: Radiation Oncology | Admitting: Radiation Oncology

## 2017-02-18 ENCOUNTER — Ambulatory Visit: Payer: Medicare Other

## 2017-02-18 DIAGNOSIS — G709 Myoneural disorder, unspecified: Secondary | ICD-10-CM | POA: Diagnosis not present

## 2017-02-18 DIAGNOSIS — F17210 Nicotine dependence, cigarettes, uncomplicated: Secondary | ICD-10-CM | POA: Diagnosis not present

## 2017-02-18 DIAGNOSIS — G893 Neoplasm related pain (acute) (chronic): Secondary | ICD-10-CM | POA: Diagnosis not present

## 2017-02-18 DIAGNOSIS — Z510 Encounter for antineoplastic radiation therapy: Secondary | ICD-10-CM | POA: Diagnosis not present

## 2017-02-18 DIAGNOSIS — C7951 Secondary malignant neoplasm of bone: Secondary | ICD-10-CM | POA: Diagnosis not present

## 2017-02-18 DIAGNOSIS — C61 Malignant neoplasm of prostate: Secondary | ICD-10-CM | POA: Diagnosis not present

## 2017-02-19 ENCOUNTER — Ambulatory Visit: Payer: Medicare Other

## 2017-02-19 ENCOUNTER — Ambulatory Visit
Admission: RE | Admit: 2017-02-19 | Discharge: 2017-02-19 | Disposition: A | Discharge status: Home / Self Care | Payer: Medicare Other | Source: Ambulatory Visit | Attending: Radiation Oncology | Admitting: Radiation Oncology

## 2017-02-19 DIAGNOSIS — F17210 Nicotine dependence, cigarettes, uncomplicated: Secondary | ICD-10-CM | POA: Diagnosis not present

## 2017-02-19 DIAGNOSIS — G709 Myoneural disorder, unspecified: Secondary | ICD-10-CM | POA: Diagnosis not present

## 2017-02-19 DIAGNOSIS — G893 Neoplasm related pain (acute) (chronic): Secondary | ICD-10-CM | POA: Diagnosis not present

## 2017-02-19 DIAGNOSIS — Z510 Encounter for antineoplastic radiation therapy: Secondary | ICD-10-CM | POA: Diagnosis not present

## 2017-02-19 DIAGNOSIS — C7951 Secondary malignant neoplasm of bone: Secondary | ICD-10-CM | POA: Diagnosis not present

## 2017-02-19 DIAGNOSIS — C61 Malignant neoplasm of prostate: Secondary | ICD-10-CM | POA: Diagnosis not present

## 2017-02-20 ENCOUNTER — Ambulatory Visit: Payer: Medicare Other

## 2017-02-20 ENCOUNTER — Ambulatory Visit
Admission: RE | Admit: 2017-02-20 | Discharge: 2017-02-20 | Disposition: A | Discharge status: Home / Self Care | Payer: Medicare Other | Source: Ambulatory Visit | Attending: Radiation Oncology | Admitting: Radiation Oncology

## 2017-02-20 DIAGNOSIS — Z510 Encounter for antineoplastic radiation therapy: Secondary | ICD-10-CM | POA: Diagnosis not present

## 2017-02-20 DIAGNOSIS — G709 Myoneural disorder, unspecified: Secondary | ICD-10-CM | POA: Diagnosis not present

## 2017-02-20 DIAGNOSIS — F17210 Nicotine dependence, cigarettes, uncomplicated: Secondary | ICD-10-CM | POA: Diagnosis not present

## 2017-02-20 DIAGNOSIS — C7951 Secondary malignant neoplasm of bone: Secondary | ICD-10-CM | POA: Diagnosis not present

## 2017-02-20 DIAGNOSIS — G893 Neoplasm related pain (acute) (chronic): Secondary | ICD-10-CM | POA: Diagnosis not present

## 2017-02-20 DIAGNOSIS — C61 Malignant neoplasm of prostate: Secondary | ICD-10-CM | POA: Diagnosis not present

## 2017-02-23 ENCOUNTER — Ambulatory Visit: Payer: Medicare Other

## 2017-02-24 ENCOUNTER — Ambulatory Visit: Payer: Medicare Other

## 2017-02-24 ENCOUNTER — Telehealth: Payer: Self-pay

## 2017-02-24 NOTE — Telephone Encounter (Signed)
Patient caths indefinitely. 

## 2017-02-25 ENCOUNTER — Ambulatory Visit: Payer: Medicare Other

## 2017-02-25 ENCOUNTER — Ambulatory Visit: Admission: RE | Admit: 2017-02-25 | Payer: Medicare Other | Source: Ambulatory Visit

## 2017-02-26 ENCOUNTER — Ambulatory Visit: Payer: Medicare Other

## 2017-02-26 ENCOUNTER — Telehealth: Payer: Self-pay | Admitting: *Deleted

## 2017-02-26 NOTE — Telephone Encounter (Signed)
Patient's wife states that his pain medication is not strong enough, however he does get relief with Aleve. Writer advised patient to take Aleve and use oxycodone for breakthrough pain. Advised her to discuss pain management at his appointment on 8/7.

## 2017-02-27 ENCOUNTER — Ambulatory Visit: Payer: Medicare Other

## 2017-03-02 ENCOUNTER — Ambulatory Visit: Admission: RE | Admit: 2017-03-02 | Payer: Medicare Other | Source: Ambulatory Visit

## 2017-03-02 ENCOUNTER — Ambulatory Visit: Payer: Medicare Other

## 2017-03-02 NOTE — Progress Notes (Signed)
[place]  Telephone:([phone]) [phone]  Fax:([phone]) [place] DOB: 07-18-1961  MR#: [id number]  XFG#:[id number]  Patient Care Team: Patient, No Pcp Per as PCP - General (General Practice) Clent Jacks, RN as Registered Nurse  CHIEF COMPLAINT:  Stage IV prostate cancer Gleason grade 8 with metastases to liver and bone.  INTERVAL HISTORY:  Patient returns to clinic today for further evaluation and additional treatment planning. He continues to have flank pain, but has significantly improved since completing XRT. He also is complaining of a new onset numbness in his left side from his abdomen to his toes. He has no other neurologic complaints. He continues to tolerate Zytiga well although it is unclear if his compliance. He does not take prednisone regularly as prescribed. He denies any other pain. He has a good appetite and denies weight loss. He denies any recent fevers or illnesses. He denies any nausea, vomiting, constipation, or diarrhea. He does admit to urinary frequency especially at night, but no other urinary complaints. Patient otherwise feels well and offers no further specific complaints.   REVIEW OF SYSTEMS:   Review of Systems  Constitutional: Negative.  Negative for fever, malaise/fatigue and weight loss.  Respiratory: Negative.  Negative for cough and shortness of breath.   Cardiovascular: Negative.  Negative for chest pain and leg swelling.  Gastrointestinal: Negative.  Negative for abdominal pain.  Genitourinary: Positive for flank pain and frequency.  Skin: Negative.  Negative for rash.  Neurological: Positive for sensory change. Negative for weakness.  Psychiatric/Behavioral: Negative.  The patient is not nervous/anxious.     As per HPI. Otherwise, a complete review of systems is negative.  ONCOLOGY HISTORY: Oncology History   Carcinoma prostate adenocarcinoma in multiple fragments.  Gleason grade 8.  Involving neutral space.   Large duct and S cylinder-type.  Poorly differentiated.  Lateral neck involvement.Marland Kitchen PSAs low 2.4.  CT scan of abdomen was abnormal with multiple liver lesions.  Diagnosis in April of 2016 Clinical staging T1 cN0 M1 stage IV disease 2, CT-guided liver biopsies consistent with metastases from the prostate cancer   3.  Patient refused any chemotherapy or Lupron Started on ZYTIGA and prednisone from May of 2016     Prostate cancer metastatic to bone [facility])   12/09/2014 Initial Diagnosis    Cancer of prostate       PAST MEDICAL HISTORY: Past Medical History:  Diagnosis Date  . Cancer of prostate ([place]) 12/09/2014  . Neuromuscular disorder ([place])    brain tumor at 56 years of age  . Stroke ([place])    at 56 years of age    PAST SURGICAL HISTORY: No past surgical history on file.  FAMILY HISTORY: Reviewed and unchanged. No reported history of malignancy or chronic disease.  ADVANCED DIRECTIVES:    HEALTH MAINTENANCE: Social History  Substance Use Topics  . Smoking status: Current Every Day Smoker    Packs/day: 0.50    Types: Cigarettes  . Smokeless tobacco: Never Used  . Alcohol use No      No Known Allergies  Current Outpatient Prescriptions  Medication Sig Dispense Refill  . abiraterone Acetate (ZYTIGA) 250 MG tablet Take 4 tablets (1,000 mg total) by mouth daily. 120 tablet 3  . oxyCODONE (ROXICODONE) 5 MG immediate release tablet Take 1-2 tablets (5-10 mg total) by mouth every 4 (four) hours as needed. 30 tablet 0  . tamsulosin (FLOMAX) 0.4 MG CAPS capsule Take 1 capsule (0.4 mg total)  by mouth daily. (Patient not taking: Reported on 03/03/2017) 7 capsule 0   No current facility-administered medications for this visit.     OBJECTIVE: BP (!) 136/92 (BP Location: Left Arm, Patient Position: Sitting)   Pulse 87   Temp 98 F (36.7 C) (Tympanic)   Wt 153 lb 8 oz (69.6 kg)   BMI 24.78 kg/m    Body mass index is 24.78 kg/m.    ECOG FS:0 - Asymptomatic  General:  Well-developed, well-nourished, no acute distress. Lungs: Clear to auscultation bilaterally. Heart: Regular rate and rhythm. No rubs, murmurs, or gallops.e. Abdomen: Soft, nontender, nondistended. No organomegaly noted, normoactive bowel sounds. Musculoskeletal: No edema, cyanosis, or clubbing. Mild palpation to tenderness on right flank. Neuro: Alert, answering all questions appropriately. Cranial nerves grossly intact. Skin: No rashes or petechiae noted. Psych: Normal affect. Lymphatics: No cervical, clavicular LAD.   LAB RESULTS:  CBC    Component Value Date/Time   WBC 8.6 03/03/2017 1058   RBC 5.10 03/03/2017 1058   HGB 16.3 03/03/2017 1058   HGB 15.1 11/08/2014 1009   HCT 45.7 03/03/2017 1058   HCT 44.5 11/08/2014 1009   PLT 211 03/03/2017 1058   PLT 250 11/08/2014 1009   MCV 89.7 03/03/2017 1058   MCV 92 11/08/2014 1009   MCH 32.0 03/03/2017 1058   MCHC 35.7 03/03/2017 1058   RDW 13.6 03/03/2017 1058   RDW 13.2 11/08/2014 1009   LYMPHSABS 1.2 03/03/2017 1058   LYMPHSABS 2.0 11/08/2014 1009   MONOABS 0.7 03/03/2017 1058   MONOABS 0.8 11/08/2014 1009   EOSABS 0.1 03/03/2017 1058   EOSABS 0.1 11/08/2014 1009   BASOSABS 0.0 03/03/2017 1058   BASOSABS 0.1 11/08/2014 1009   BMET    Component Value Date/Time   NA 139 03/03/2017 1058   NA 134 (L) 11/08/2014 1009   K 2.9 (L) 03/03/2017 1058   K 3.7 11/08/2014 1009   CL 101 03/03/2017 1058   CL 105 11/08/2014 1009   CO2 29 03/03/2017 1058   CO2 22 11/08/2014 1009   GLUCOSE 95 03/03/2017 1058   GLUCOSE 111 (H) 11/08/2014 1009   BUN 12 03/03/2017 1058   BUN 12 11/08/2014 1009   CREATININE 0.71 03/03/2017 1058   CREATININE 0.59 (L) 11/08/2014 1009   CALCIUM 9.5 03/03/2017 1058   CALCIUM 9.2 11/08/2014 1009   GFRNONAA >60 03/03/2017 1058   GFRNONAA >60 11/08/2014 1009   GFRAA >60 03/03/2017 1058   GFRAA >60 11/08/2014 1009   Lab Results  Component Value Date   PSA 3.90 12/16/2016     STUDIES: No results  found.  ASSESSMENT:  Stage IV prostate cancer Gleason grade 8 with metastases to liver and bone.  PLAN:    1. Stage IV prostate cancer Gleason grade 8 with metastases to liver and bone: Bone scan results from December 30, 2016 reviewed independently with slightly progressive disease particularly in his right ribs consistent with his current complaint of right flank pain. He has now completed palliative XRT with improvement of his pain. After lengthy discussion with the patient, he still does not wish to pursue chemotherapy but has agreed to pursue [facility] therapy with Mills Koller. He is currently on Zytiga, but is unclear his compliance and the actual dose he is taking. He only takes his prednisone occasionally. Return to clinic next week to receive his first injection and then in 5 weeks for further evaluation and additional Firmagon. Can also considering transition patient to Lupron to minimize injections.  2. Bony  lesions: Patient is refusing Xgeva or Zometa. XRT as above. 3. Hypokalemia: Patient's potassium remains persistently low. He refuses to take oral or IV supplementation. 4. Urinary frequency: Likely secondary to enlarged prostate. Patient has Flomax listed in his medications, but is unclear his compliance. Continue follow-up with urology. 5. Flank pain: Improved. Continue current dose of oxycodone. 6. Neuropathy: Unclear etiology. Monitor.   Patient expressed understanding and was in agreement with this plan. He also understands that He can call clinic at any time with any questions, concerns, or complaints.    Cancer of prostate [facility])   Staging form: Prostate, AJCC 7th Edition     Clinical: Stage IV (T1c, N0, M1, PSA: Less than 10, Gleason 8-10 - Poorly differentiated/undifferentiated (marked anaplasia)) - Signed by Forest Gleason, MD on 12/09/2014   [name], MD   03/03/2017 12:00 PM

## 2017-03-03 ENCOUNTER — Ambulatory Visit: Payer: Medicare Other

## 2017-03-03 ENCOUNTER — Inpatient Hospital Stay (HOSPITAL_BASED_OUTPATIENT_CLINIC_OR_DEPARTMENT_OTHER): Payer: Medicare Other | Admitting: Oncology

## 2017-03-03 ENCOUNTER — Inpatient Hospital Stay: Payer: Medicare Other | Attending: Oncology

## 2017-03-03 ENCOUNTER — Other Ambulatory Visit: Payer: Self-pay

## 2017-03-03 VITALS — BP 136/92 | HR 87 | Temp 98.0°F | Wt 153.5 lb

## 2017-03-03 DIAGNOSIS — E876 Hypokalemia: Secondary | ICD-10-CM | POA: Insufficient documentation

## 2017-03-03 DIAGNOSIS — Z923 Personal history of irradiation: Secondary | ICD-10-CM | POA: Diagnosis not present

## 2017-03-03 DIAGNOSIS — C61 Malignant neoplasm of prostate: Secondary | ICD-10-CM

## 2017-03-03 DIAGNOSIS — C7951 Secondary malignant neoplasm of bone: Secondary | ICD-10-CM | POA: Diagnosis not present

## 2017-03-03 DIAGNOSIS — R109 Unspecified abdominal pain: Secondary | ICD-10-CM

## 2017-03-03 DIAGNOSIS — F17210 Nicotine dependence, cigarettes, uncomplicated: Secondary | ICD-10-CM | POA: Diagnosis not present

## 2017-03-03 DIAGNOSIS — Z8673 Personal history of transient ischemic attack (TIA), and cerebral infarction without residual deficits: Secondary | ICD-10-CM

## 2017-03-03 DIAGNOSIS — C787 Secondary malignant neoplasm of liver and intrahepatic bile duct: Secondary | ICD-10-CM | POA: Insufficient documentation

## 2017-03-03 DIAGNOSIS — G629 Polyneuropathy, unspecified: Secondary | ICD-10-CM | POA: Insufficient documentation

## 2017-03-03 DIAGNOSIS — Z79899 Other long term (current) drug therapy: Secondary | ICD-10-CM

## 2017-03-03 LAB — COMPREHENSIVE METABOLIC PANEL
ALT: 13 U/L — ABNORMAL LOW (ref 17–63)
ANION GAP: 9 (ref 5–15)
AST: 19 U/L (ref 15–41)
Albumin: 4.3 g/dL (ref 3.5–5.0)
Alkaline Phosphatase: 117 U/L (ref 38–126)
BUN: 12 mg/dL (ref 6–20)
CHLORIDE: 101 mmol/L (ref 101–111)
CO2: 29 mmol/L (ref 22–32)
Calcium: 9.5 mg/dL (ref 8.9–10.3)
Creatinine, Ser: 0.71 mg/dL (ref 0.61–1.24)
Glucose, Bld: 95 mg/dL (ref 65–99)
POTASSIUM: 2.9 mmol/L — AB (ref 3.5–5.1)
Sodium: 139 mmol/L (ref 135–145)
Total Bilirubin: 0.5 mg/dL (ref 0.3–1.2)
Total Protein: 7.5 g/dL (ref 6.5–8.1)

## 2017-03-03 LAB — PSA: PROSTATIC SPECIFIC ANTIGEN: 12.32 ng/mL — AB (ref 0.00–4.00)

## 2017-03-03 MED ORDER — OXYCODONE HCL 10 MG PO TABS: 10.0000 mg | ORAL_TABLET | Freq: Four times a day (QID) | ORAL | 0 refills | Status: DC | PRN

## 2017-03-03 MED ORDER — GABAPENTIN 300 MG PO CAPS: 300.0000 mg | ORAL_CAPSULE | Freq: Every day | ORAL | 2 refills | Status: DC

## 2017-03-03 NOTE — Progress Notes (Signed)
Patient is here today for follow up, he mentions he has a cough thinks he may have a touch of pneumonia, he also states his feet are numb. He has numbness from the left side of his lower abdomen down to his toes.

## 2017-03-04 ENCOUNTER — Ambulatory Visit: Payer: Medicare Other

## 2017-03-04 LAB — CBC WITH DIFFERENTIAL/PLATELET
BASOS ABS: 0 10*3/uL (ref 0–0.1)
BASOS PCT: 0 %
EOS PCT: 1 %
Eosinophils Absolute: 0.1 10*3/uL (ref 0–0.7)
HCT: 45.7 % (ref 39.0–52.0)
Hemoglobin: 16.3 g/dL (ref 13.0–17.0)
LYMPHS PCT: 14 %
Lymphs Abs: 1.2 10*3/uL (ref 1.0–3.6)
MCH: 32 pg (ref 26.0–34.0)
MCHC: 35.7 g/dL (ref 32.0–36.0)
MCV: 89.7 fL (ref 80.0–100.0)
Monocytes Absolute: 0.7 10*3/uL (ref 0.2–1.0)
Monocytes Relative: 8 %
Neutro Abs: 6.7 10*3/uL — ABNORMAL HIGH (ref 1.4–6.5)
Neutrophils Relative %: 77 %
PLATELETS: 211 10*3/uL (ref 150–440)
RBC: 5.1 MIL/uL (ref 4.40–5.90)
RDW: 13.6 % (ref 11.5–14.5)
WBC: 8.6 10*3/uL (ref 3.8–10.6)

## 2017-03-05 ENCOUNTER — Ambulatory Visit: Payer: Medicare Other | Admitting: Oncology

## 2017-03-05 ENCOUNTER — Ambulatory Visit: Payer: Medicare Other

## 2017-03-05 ENCOUNTER — Other Ambulatory Visit: Payer: Medicare Other

## 2017-03-07 ENCOUNTER — Ambulatory Visit: Payer: Medicare Other

## 2017-03-10 ENCOUNTER — Inpatient Hospital Stay: Payer: Medicare Other

## 2017-03-17 ENCOUNTER — Ambulatory Visit: Payer: Medicare Other | Admitting: Oncology

## 2017-03-17 ENCOUNTER — Other Ambulatory Visit: Payer: Medicare Other

## 2017-03-25 ENCOUNTER — Ambulatory Visit (INDEPENDENT_AMBULATORY_CARE_PROVIDER_SITE_OTHER): Payer: Medicare Other | Admitting: Urology

## 2017-03-25 ENCOUNTER — Encounter: Payer: Self-pay | Admitting: Urology

## 2017-03-25 VITALS — BP 145/80 | HR 103 | Ht 66.0 in | Wt 152.0 lb

## 2017-03-25 DIAGNOSIS — C61 Malignant neoplasm of prostate: Secondary | ICD-10-CM

## 2017-03-25 DIAGNOSIS — R339 Retention of urine, unspecified: Secondary | ICD-10-CM

## 2017-03-25 LAB — BLADDER SCAN AMB NON-IMAGING: Scan Result: 10

## 2017-03-25 NOTE — Progress Notes (Signed)
03/25/2017 11:15 AM   Roger Sparks. 24-Aug-1960 [id number]  Referring provider: No referring provider defined for this encounter.  Chief Complaint  Patient presents with  . Prostate Cancer    HPI: 56 year old male with stage IV metastatic prostate cancer with a recent episode of urinary retention and bilateral hydronephrosis requiring Foley catheter returns today for follow-up after successful voiding trial and CIC teaching.  His first diagnosis of prostate cancer, Gleason grade 8 with metastatic lesions to the liver and bone 2016. Since then, he is refused androgen deprivation therapy. He is currently being managed with Zytiga but does not take his prednisone regularly. He's refuse all other interventions.  On most recent imaging in the form of CT renal stone protocol on 02/08/2017, he's had progression of his local disease with growth of his prostate nonobstructing his outlet.  In addition, most recent bone scan on 12/30/2016 shows progression of his bone disease in his mid thoracic and right posterior rib.  He's undergone peeling of radiation treatments for this.  He's also agreed to start [facility] therapy under the care of Dr. Grayland Ormond.  Several months ago, he developed worsening urinary symptoms. Ultimately, he ended up in the emergency room in urinary retention with bilateral hydronephrosis but a preserved renal function. Follow-up renal ultrasound 4 days later with a Foley catheter patient resolution of his hydronephrosis. He subsequently had his catheter removed in the office and was taught how to clean intermittent catheter.  Since that time, he reports that he's been voiding well without difficulty. He tried to catheterize at home several times but got no urinary return. It unclear whether he is doing this appropriately or his residuals are minimal.  He is fairly limited insight into his diease state. He reports that all of this was caused by a pneumonia that was diagnosed in  the ER although there is no mention of this.   PMH: Past Medical History:  Diagnosis Date  . Cancer of prostate ([place]) 12/09/2014  . Neuromuscular disorder ([place])    brain tumor at 56 years of age  . Stroke [facility])    at 56 years of age    Surgical History: History reviewed. No pertinent surgical history.  Home Medications:  Allergies as of 03/25/2017   No Known Allergies     Medication List       Accurate as of 03/25/17 11:59 PM. Always use your most recent med list.          abiraterone Acetate 250 MG tablet Commonly known as:  ZYTIGA Take 4 tablets (1,000 mg total) by mouth daily.   gabapentin 300 MG capsule Commonly known as:  NEURONTIN Take 1 capsule (300 mg total) by mouth at bedtime.   Oxycodone HCl 10 MG Tabs Take 1 tablet (10 mg total) by mouth every 6 (six) hours as needed.   tamsulosin 0.4 MG Caps capsule Commonly known as:  FLOMAX Take 1 capsule (0.4 mg total) by mouth daily.            Discharge Care Instructions        Start     Ordered   03/25/17 0000  Bladder Scan (Post Void Residual) in office     03/25/17 1015      Allergies: No Known Allergies  Family History: History reviewed. No pertinent family history.  Social History:  reports that he has been smoking Cigarettes.  He has been smoking about 0.50 packs per day. He has never used smokeless tobacco. He reports  that he does not drink alcohol or use drugs.  ROS: UROLOGY Frequent Urination?: No Hard to postpone urination?: No Burning/pain with urination?: No Get up at night to urinate?: Yes Leakage of urine?: No Urine stream starts and stops?: No Trouble starting stream?: No Do you have to strain to urinate?: No Blood in urine?: No Urinary tract infection?: No Sexually transmitted disease?: No Injury to kidneys or bladder?: No Painful intercourse?: No Weak stream?: No Erection problems?: No Penile pain?: No  Gastrointestinal Nausea?: No Vomiting?:  No Indigestion/heartburn?: No Diarrhea?: No Constipation?: No  Constitutional Fever: No Night sweats?: No Weight loss?: No Fatigue?: No  Skin Skin rash/lesions?: No Itching?: No  Eyes Blurred vision?: No Double vision?: No  Ears/Nose/Throat Sore throat?: No Sinus problems?: No  Hematologic/Lymphatic Swollen glands?: No Easy bruising?: No  Cardiovascular Leg swelling?: No Chest pain?: No  Respiratory Cough?: No Shortness of breath?: No  Endocrine Excessive thirst?: No  Musculoskeletal Back pain?: No Joint pain?: No  Neurological Headaches?: No Dizziness?: No  Psychologic Depression?: No Anxiety?: No  Physical Exam: BP (!) 145/80 (BP Location: Right Arm, Patient Position: Sitting, Cuff Size: Normal)   Pulse (!) 103   Ht 5\' 6"  (1.676 m)   Wt 152 lb (68.9 kg)   BMI 24.53 kg/m   Constitutional:  Alert and oriented, No acute distress.  Difficult to understand, mumbles. HEENT: [place] AT, moist mucus membranes.  Trachea midline, no masses. Cardiovascular: No clubbing, cyanosis, or edema. Respiratory: Normal respiratory effort, no increased work of breathing. GI: Abdomen is soft, nontender, nondistended, no abdominal masses.  No suprapubic tenderness or distention. Skin: No rashes, bruises or suspicious lesions. Neurologic: Right arm paralysis.   Psychiatric: Normal mood and affect.  Right limited.  Laboratory Data: Lab Results  Component Value Date   WBC 8.6 03/03/2017   HGB 16.3 03/03/2017   HCT 45.7 03/03/2017   MCV 89.7 03/03/2017   PLT 211 03/03/2017    Lab Results  Component Value Date   CREATININE 0.71 03/03/2017    Lab Results  Component Value Date   PSA 3.90 12/16/2016   PSA 1.17 09/18/2016   PSA 0.47 04/09/2016    Urinalysis N/a  Pertinent Imaging: Results for orders placed or performed in visit on 03/25/17  Bladder Scan (Post Void Residual) in office  Result Value Ref Range   Scan Result 10     Assessment & Plan:    1.  Prostate cancer [facility]) Metastatic prostate cancer dx 2016 has refused intervention other than Zytiga only with questionable compliance Recently evaluated by Dr. Sandi Carne agreed to [facility] therapy  2. Urinary retention Secondary to local progression of prostate cancer Continue flomax Voiding now spontaneously without need for intermittent catheter Post void residual is minimal today Patient provided with additional instruction for CIC teaching when necessary He will return if his voiding symptoms recur, may consider channel TURP   F/u if voiding symptoms recur, primarily under the care of Dr. Otilio Connors, [place] [address], [place], [place] [address] [phone]

## 2017-04-01 ENCOUNTER — Other Ambulatory Visit: Payer: Self-pay | Admitting: *Deleted

## 2017-04-01 MED ORDER — OXYCODONE HCL 10 MG PO TABS: 10.0000 mg | ORAL_TABLET | Freq: Four times a day (QID) | ORAL | 0 refills | Status: DC | PRN

## 2017-04-02 ENCOUNTER — Ambulatory Visit: Payer: Medicare Other | Attending: Radiation Oncology | Admitting: Radiation Oncology

## 2017-04-03 NOTE — Progress Notes (Signed)
Roger Sparks  [phone]  Fax:([phone]) [place] DOB: 1961-04-15  MR#: [id number]  PYP#:[id number]  Patient Care Team: Patient, No Pcp Per as PCP - General (General Practice) Clent Jacks, RN as Registered Nurse  CHIEF COMPLAINT:  Stage IV prostate cancer Gleason grade 8 with metastases to liver and bone.  INTERVAL HISTORY:  Patient returns to clinic today for further evaluation. He continues to have flank pain, but has significantly improved since completing XRT. He also continues to have numbness in his left side from his abdomen to his toes. He has no other neurologic complaints. He continues to tolerate Zytiga well although it is unclear if his compliance. He does not take prednisone regularly as prescribed. He denies any other pain. He has a good appetite and denies weight loss. He denies any recent fevers or illnesses. He denies any nausea, vomiting, constipation, or diarrhea. He does admit to urinary frequency especially at night, but no other urinary complaints. Patient otherwise feels well and offers no further specific complaints.   REVIEW OF SYSTEMS:   Review of Systems  Constitutional: Negative.  Negative for fever, malaise/fatigue and weight loss.  Respiratory: Negative.  Negative for cough and shortness of breath.   Cardiovascular: Negative.  Negative for chest pain and leg swelling.  Gastrointestinal: Negative.  Negative for abdominal pain.  Genitourinary: Positive for flank pain and frequency.  Skin: Negative.  Negative for rash.  Neurological: Positive for sensory change. Negative for weakness.  Psychiatric/Behavioral: Negative.  The patient is not nervous/anxious.     As per HPI. Otherwise, a complete review of systems is negative.  ONCOLOGY HISTORY: Oncology History   Carcinoma prostate adenocarcinoma in multiple fragments.  Gleason grade 8.  Involving neutral space.  Large duct and S cylinder-type.  Poorly  differentiated.  Lateral neck involvement.Marland Kitchen PSAs low 2.4.  CT scan of abdomen was abnormal with multiple liver lesions.  Diagnosis in April of 2016 Clinical staging T1 cN0 M1 stage IV disease 2, CT-guided liver biopsies consistent with metastases from the prostate cancer   3.  Patient refused any chemotherapy or Lupron Started on ZYTIGA and prednisone from May of 2016     Prostate cancer metastatic to bone [facility])   12/09/2014 Initial Diagnosis    Cancer of prostate       PAST MEDICAL HISTORY: Past Medical History:  Diagnosis Date  . Cancer of prostate ([place]) 12/09/2014  . Neuromuscular disorder ([place])    brain tumor at 56 years of age  . Stroke ([place])    at 56 years of age    PAST SURGICAL HISTORY: No past surgical history on file.  FAMILY HISTORY: Reviewed and unchanged. No reported history of malignancy or chronic disease.  ADVANCED DIRECTIVES:    HEALTH MAINTENANCE: Social History  Substance Use Topics  . Smoking status: Current Every Day Smoker    Packs/day: 0.50    Types: Cigarettes  . Smokeless tobacco: Never Used  . Alcohol use No      No Known Allergies  Current Outpatient Prescriptions  Medication Sig Dispense Refill  . abiraterone Acetate (ZYTIGA) 250 MG tablet Take 4 tablets (1,000 mg total) by mouth daily. 120 tablet 3  . Oxycodone HCl 10 MG TABS Take 1 tablet (10 mg total) by mouth every 6 (six) hours as needed. 60 tablet 0  . gabapentin (NEURONTIN) 300 MG capsule Take 1 capsule (300 mg total) by mouth at bedtime. (Patient not taking: Reported on 03/25/2017)  30 capsule 2  . tamsulosin (FLOMAX) 0.4 MG CAPS capsule Take 1 capsule (0.4 mg total) by mouth daily. (Patient not taking: Reported on 04/07/2017) 7 capsule 0   No current facility-administered medications for this visit.     OBJECTIVE: BP (!) 144/90 (BP Location: Left Arm, Patient Position: Sitting)   Pulse 79   Temp 98.4 F (36.9 C) (Tympanic)   Resp 18   Wt 152 lb 14.4 oz (69.4 kg)   BMI 24.68  kg/m    Body mass index is 24.68 kg/m.    ECOG FS:0 - Asymptomatic  General: Well-developed, well-nourished, no acute distress. Lungs: Clear to auscultation bilaterally. Heart: Regular rate and rhythm. No rubs, murmurs, or gallops.e. Abdomen: Soft, nontender, nondistended. No organomegaly noted, normoactive bowel sounds. Musculoskeletal: No edema, cyanosis, or clubbing. Mild palpation to tenderness on right flank. Neuro: Alert, answering all questions appropriately. Cranial nerves grossly intact. Skin: No rashes or petechiae noted. Psych: Normal affect. Lymphatics: No cervical, clavicular LAD.   LAB RESULTS:  CBC    Component Value Date/Time   WBC 9.5 04/07/2017 1113   RBC 4.58 04/07/2017 1113   HGB 14.7 04/07/2017 1113   HGB 15.1 11/08/2014 1009   HCT 40.9 04/07/2017 1113   HCT 44.5 11/08/2014 1009   PLT 250 04/07/2017 1113   PLT 250 11/08/2014 1009   MCV 89.3 04/07/2017 1113   MCV 92 11/08/2014 1009   MCH 32.0 04/07/2017 1113   MCHC 35.9 04/07/2017 1113   RDW 13.8 04/07/2017 1113   RDW 13.2 11/08/2014 1009   LYMPHSABS 1.1 04/07/2017 1113   LYMPHSABS 2.0 11/08/2014 1009   MONOABS 0.7 04/07/2017 1113   MONOABS 0.8 11/08/2014 1009   EOSABS 0.1 04/07/2017 1113   EOSABS 0.1 11/08/2014 1009   BASOSABS 0.1 04/07/2017 1113   BASOSABS 0.1 11/08/2014 1009   BMET    Component Value Date/Time   NA 139 04/07/2017 1113   NA 134 (L) 11/08/2014 1009   K 3.3 (L) 04/07/2017 1113   K 3.7 11/08/2014 1009   CL 103 04/07/2017 1113   CL 105 11/08/2014 1009   CO2 28 04/07/2017 1113   CO2 22 11/08/2014 1009   GLUCOSE 98 04/07/2017 1113   GLUCOSE 111 (H) 11/08/2014 1009   BUN 14 04/07/2017 1113   BUN 12 11/08/2014 1009   CREATININE 0.72 04/07/2017 1113   CREATININE 0.59 (L) 11/08/2014 1009   CALCIUM 9.3 04/07/2017 1113   CALCIUM 9.2 11/08/2014 1009   GFRNONAA >60 04/07/2017 1113   GFRNONAA >60 11/08/2014 1009   GFRAA >60 04/07/2017 1113   GFRAA >60 11/08/2014 1009   Lab  Results  Component Value Date   PSA 3.90 12/16/2016     STUDIES: No results found.  ASSESSMENT:  Stage IV prostate cancer Gleason grade 8 with metastases to liver and bone.  PLAN:    1. Stage IV prostate cancer Gleason grade 8 with metastases to liver and bone: Bone scan results from December 30, 2016 reviewed independently with slightly progressive disease particularly in his right ribs consistent with his current complaint of right flank pain. He has now completed palliative XRT with improvement of his pain. After lengthy discussion with the patient, he still does not wish to pursue chemotherapy. He also was scheduled for  [facility], but now is refusing this. He is currently on Zytiga, but is unclear his compliance and the actual dose he is taking. He only takes his prednisone occasionally. Agreed to Lupron today and received 22.5 mg. Return to  clinic in 3 months with repeat laboratory work attenuation of Lupron, and further evaluation.  2. Bony lesions: Patient is refusing Xgeva or Zometa. XRT as above. 3. Hypokalemia: Patient's potassium remains persistently low. He refuses to take oral or IV supplementation. 4. Urinary frequency: Likely secondary to enlarged prostate. Patient has Flomax listed in his medications, but is unclear his compliance. Continue follow-up with urology. 5. Flank pain: Improved. Continue current dose of oxycodone. 6. Neuropathy: Unclear etiology. Monitor. Does not appear related to his malignancy.  Patient expressed understanding and was in agreement with this plan. He also understands that He can call clinic at any time with any questions, concerns, or complaints.    Cancer of prostate [facility])   Staging form: Prostate, AJCC 7th Edition     Clinical: Stage IV (T1c, N0, M1, PSA: Less than 10, Gleason 8-10 - Poorly differentiated/undifferentiated (marked anaplasia)) - Signed by Forest Gleason, MD on 12/09/2014   [name], MD   04/10/2017 11:20 AM

## 2017-04-07 ENCOUNTER — Inpatient Hospital Stay: Payer: Medicare Other | Attending: Oncology

## 2017-04-07 ENCOUNTER — Inpatient Hospital Stay: Payer: Medicare Other

## 2017-04-07 ENCOUNTER — Inpatient Hospital Stay (HOSPITAL_BASED_OUTPATIENT_CLINIC_OR_DEPARTMENT_OTHER): Payer: Medicare Other | Admitting: Oncology

## 2017-04-07 ENCOUNTER — Other Ambulatory Visit: Payer: Self-pay

## 2017-04-07 VITALS — BP 144/90 | HR 79 | Temp 98.4°F | Resp 18 | Wt 152.9 lb

## 2017-04-07 DIAGNOSIS — C61 Malignant neoplasm of prostate: Secondary | ICD-10-CM | POA: Diagnosis not present

## 2017-04-07 DIAGNOSIS — Z8673 Personal history of transient ischemic attack (TIA), and cerebral infarction without residual deficits: Secondary | ICD-10-CM

## 2017-04-07 DIAGNOSIS — Z79899 Other long term (current) drug therapy: Secondary | ICD-10-CM | POA: Insufficient documentation

## 2017-04-07 DIAGNOSIS — Z79818 Long term (current) use of other agents affecting estrogen receptors and estrogen levels: Secondary | ICD-10-CM | POA: Diagnosis not present

## 2017-04-07 DIAGNOSIS — R109 Unspecified abdominal pain: Secondary | ICD-10-CM

## 2017-04-07 DIAGNOSIS — R351 Nocturia: Secondary | ICD-10-CM | POA: Insufficient documentation

## 2017-04-07 DIAGNOSIS — F17210 Nicotine dependence, cigarettes, uncomplicated: Secondary | ICD-10-CM

## 2017-04-07 DIAGNOSIS — E876 Hypokalemia: Secondary | ICD-10-CM

## 2017-04-07 DIAGNOSIS — C787 Secondary malignant neoplasm of liver and intrahepatic bile duct: Secondary | ICD-10-CM

## 2017-04-07 DIAGNOSIS — Z923 Personal history of irradiation: Secondary | ICD-10-CM | POA: Diagnosis not present

## 2017-04-07 DIAGNOSIS — G629 Polyneuropathy, unspecified: Secondary | ICD-10-CM | POA: Diagnosis not present

## 2017-04-07 DIAGNOSIS — C7951 Secondary malignant neoplasm of bone: Principal | ICD-10-CM

## 2017-04-07 DIAGNOSIS — Z8669 Personal history of other diseases of the nervous system and sense organs: Secondary | ICD-10-CM

## 2017-04-07 LAB — COMPREHENSIVE METABOLIC PANEL
ALBUMIN: 3.9 g/dL (ref 3.5–5.0)
ALT: 13 U/L — AB (ref 17–63)
AST: 19 U/L (ref 15–41)
Alkaline Phosphatase: 130 U/L — ABNORMAL HIGH (ref 38–126)
Anion gap: 8 (ref 5–15)
BUN: 14 mg/dL (ref 6–20)
CHLORIDE: 103 mmol/L (ref 101–111)
CO2: 28 mmol/L (ref 22–32)
CREATININE: 0.72 mg/dL (ref 0.61–1.24)
Calcium: 9.3 mg/dL (ref 8.9–10.3)
GFR calc Af Amer: >60 mL/min (ref >60)
GLUCOSE: 98 mg/dL (ref 65–99)
Potassium: 3.3 mmol/L — ABNORMAL LOW (ref 3.5–5.1)
Sodium: 139 mmol/L (ref 135–145)
Total Bilirubin: 0.6 mg/dL (ref 0.3–1.2)
Total Protein: 7.1 g/dL (ref 6.5–8.1)

## 2017-04-07 LAB — CBC WITH DIFFERENTIAL/PLATELET
Basophils Absolute: 0.1 10*3/uL (ref 0–0.1)
Basophils Relative: 1 %
EOS ABS: 0.1 10*3/uL (ref 0–0.7)
EOS PCT: 1 %
HCT: 40.9 % (ref 39.0–52.0)
Hemoglobin: 14.7 g/dL (ref 13.0–17.0)
LYMPHS ABS: 1.1 10*3/uL (ref 1.0–3.6)
Lymphocytes Relative: 12 %
MCH: 32 pg (ref 26.0–34.0)
MCHC: 35.9 g/dL (ref 32.0–36.0)
MCV: 89.3 fL (ref 80.0–100.0)
MONO ABS: 0.7 10*3/uL (ref 0.2–1.0)
MONOS PCT: 8 %
Neutro Abs: 7.5 10*3/uL — ABNORMAL HIGH (ref 1.4–6.5)
Neutrophils Relative %: 78 %
PLATELETS: 250 10*3/uL (ref 150–440)
RBC: 4.58 MIL/uL (ref 4.40–5.90)
RDW: 13.8 % (ref 11.5–14.5)
WBC: 9.5 10*3/uL (ref 3.8–10.6)

## 2017-04-07 LAB — PSA: PROSTATIC SPECIFIC ANTIGEN: 12.58 ng/mL — AB (ref 0.00–4.00)

## 2017-04-07 MED ORDER — LEUPROLIDE ACETATE (3 MONTH) 22.5 MG IM KIT
22.5000 mg | PACK | Freq: Once | INTRAMUSCULAR | Status: AC
  Administered 2017-04-07: 22.5 mg via INTRAMUSCULAR
  Filled 2017-04-07: qty 22.5

## 2017-04-07 NOTE — Progress Notes (Signed)
Patient is complaining about numbness in his feet and in his abdomen that has been going on for a month now.

## 2017-04-20 ENCOUNTER — Other Ambulatory Visit: Payer: Self-pay | Admitting: *Deleted

## 2017-04-20 MED ORDER — OXYCODONE HCL 10 MG PO TABS: 10.0000 mg | ORAL_TABLET | Freq: Four times a day (QID) | ORAL | 0 refills | Status: DC | PRN

## 2017-05-11 ENCOUNTER — Other Ambulatory Visit: Payer: Self-pay | Admitting: *Deleted

## 2017-05-11 MED ORDER — OXYCODONE HCL 10 MG PO TABS: 10.0000 mg | ORAL_TABLET | Freq: Four times a day (QID) | ORAL | 0 refills | Status: DC | PRN

## 2017-05-26 ENCOUNTER — Emergency Department: Payer: Medicare Other

## 2017-05-26 ENCOUNTER — Emergency Department
Admission: EM | Admit: 2017-05-26 | Discharge: 2017-05-26 | Disposition: A | Discharge status: Other Acute Inpatient Hospital | Payer: Medicare Other | Attending: Emergency Medicine | Admitting: Emergency Medicine

## 2017-05-26 ENCOUNTER — Encounter: Payer: Self-pay | Admitting: *Deleted

## 2017-05-26 DIAGNOSIS — I2510 Atherosclerotic heart disease of native coronary artery without angina pectoris: Secondary | ICD-10-CM | POA: Diagnosis not present

## 2017-05-26 DIAGNOSIS — G8314 Monoplegia of lower limb affecting left nondominant side: Secondary | ICD-10-CM | POA: Diagnosis not present

## 2017-05-26 DIAGNOSIS — I69351 Hemiplegia and hemiparesis following cerebral infarction affecting right dominant side: Secondary | ICD-10-CM | POA: Diagnosis not present

## 2017-05-26 DIAGNOSIS — G9520 Unspecified cord compression: Secondary | ICD-10-CM | POA: Insufficient documentation

## 2017-05-26 DIAGNOSIS — Z8507 Personal history of malignant neoplasm of pancreas: Secondary | ICD-10-CM | POA: Diagnosis not present

## 2017-05-26 DIAGNOSIS — R51 Headache: Secondary | ICD-10-CM | POA: Diagnosis not present

## 2017-05-26 DIAGNOSIS — R339 Retention of urine, unspecified: Secondary | ICD-10-CM | POA: Diagnosis present

## 2017-05-26 DIAGNOSIS — Z428 Encounter for other plastic and reconstructive surgery following medical procedure or healed injury: Secondary | ICD-10-CM | POA: Diagnosis not present

## 2017-05-26 DIAGNOSIS — Z66 Do not resuscitate: Secondary | ICD-10-CM | POA: Diagnosis not present

## 2017-05-26 DIAGNOSIS — D492 Neoplasm of unspecified behavior of bone, soft tissue, and skin: Secondary | ICD-10-CM | POA: Diagnosis not present

## 2017-05-26 DIAGNOSIS — R0602 Shortness of breath: Secondary | ICD-10-CM | POA: Diagnosis not present

## 2017-05-26 DIAGNOSIS — Z981 Arthrodesis status: Secondary | ICD-10-CM | POA: Diagnosis not present

## 2017-05-26 DIAGNOSIS — R2689 Other abnormalities of gait and mobility: Secondary | ICD-10-CM | POA: Diagnosis present

## 2017-05-26 DIAGNOSIS — F17210 Nicotine dependence, cigarettes, uncomplicated: Secondary | ICD-10-CM | POA: Diagnosis not present

## 2017-05-26 DIAGNOSIS — G893 Neoplasm related pain (acute) (chronic): Secondary | ICD-10-CM | POA: Diagnosis present

## 2017-05-26 DIAGNOSIS — Z481 Encounter for planned postprocedural wound closure: Secondary | ICD-10-CM | POA: Diagnosis not present

## 2017-05-26 DIAGNOSIS — M4804 Spinal stenosis, thoracic region: Secondary | ICD-10-CM | POA: Diagnosis present

## 2017-05-26 DIAGNOSIS — F17200 Nicotine dependence, unspecified, uncomplicated: Secondary | ICD-10-CM | POA: Diagnosis present

## 2017-05-26 DIAGNOSIS — G8220 Paraplegia, unspecified: Secondary | ICD-10-CM | POA: Diagnosis not present

## 2017-05-26 DIAGNOSIS — R079 Chest pain, unspecified: Secondary | ICD-10-CM | POA: Diagnosis not present

## 2017-05-26 DIAGNOSIS — R269 Unspecified abnormalities of gait and mobility: Secondary | ICD-10-CM | POA: Diagnosis not present

## 2017-05-26 DIAGNOSIS — G9529 Other cord compression: Secondary | ICD-10-CM

## 2017-05-26 DIAGNOSIS — R0781 Pleurodynia: Secondary | ICD-10-CM | POA: Diagnosis not present

## 2017-05-26 DIAGNOSIS — Z923 Personal history of irradiation: Secondary | ICD-10-CM | POA: Diagnosis not present

## 2017-05-26 DIAGNOSIS — C7949 Secondary malignant neoplasm of other parts of nervous system: Secondary | ICD-10-CM | POA: Diagnosis not present

## 2017-05-26 DIAGNOSIS — C7951 Secondary malignant neoplasm of bone: Secondary | ICD-10-CM | POA: Diagnosis not present

## 2017-05-26 DIAGNOSIS — M549 Dorsalgia, unspecified: Secondary | ICD-10-CM | POA: Diagnosis not present

## 2017-05-26 DIAGNOSIS — R29818 Other symptoms and signs involving the nervous system: Secondary | ICD-10-CM | POA: Diagnosis not present

## 2017-05-26 DIAGNOSIS — M899 Disorder of bone, unspecified: Secondary | ICD-10-CM | POA: Diagnosis not present

## 2017-05-26 DIAGNOSIS — D649 Anemia, unspecified: Secondary | ICD-10-CM | POA: Diagnosis not present

## 2017-05-26 DIAGNOSIS — M546 Pain in thoracic spine: Secondary | ICD-10-CM | POA: Diagnosis not present

## 2017-05-26 DIAGNOSIS — C61 Malignant neoplasm of prostate: Secondary | ICD-10-CM | POA: Diagnosis not present

## 2017-05-26 HISTORY — DX: Other cerebrovascular disease: I67.89

## 2017-05-26 HISTORY — DX: Epilepsy, unspecified, not intractable, without status epilepticus: G40.909

## 2017-05-26 LAB — COMPREHENSIVE METABOLIC PANEL
ALBUMIN: 4.1 g/dL (ref 3.5–5.0)
ALT: 12 U/L — ABNORMAL LOW (ref 17–63)
ANION GAP: 9 (ref 5–15)
AST: 23 U/L (ref 15–41)
Alkaline Phosphatase: 164 U/L — ABNORMAL HIGH (ref 38–126)
BUN: 12 mg/dL (ref 6–20)
CO2: 29 mmol/L (ref 22–32)
Calcium: 9.6 mg/dL (ref 8.9–10.3)
Chloride: 97 mmol/L — ABNORMAL LOW (ref 101–111)
Creatinine, Ser: 0.71 mg/dL (ref 0.61–1.24)
GFR calc Af Amer: >60 mL/min (ref >60)
GFR calc non Af Amer: >60 mL/min (ref >60)
GLUCOSE: 121 mg/dL — AB (ref 65–99)
POTASSIUM: 3.3 mmol/L — AB (ref 3.5–5.1)
SODIUM: 135 mmol/L (ref 135–145)
TOTAL PROTEIN: 8 g/dL (ref 6.5–8.1)
Total Bilirubin: 0.5 mg/dL (ref 0.3–1.2)

## 2017-05-26 LAB — DIFFERENTIAL
Basophils Absolute: 0 10*3/uL (ref 0–0.1)
Basophils Relative: 0 %
EOS ABS: 0 10*3/uL (ref 0–0.7)
Eosinophils Relative: 0 %
Lymphocytes Relative: 8 %
Lymphs Abs: 1 10*3/uL (ref 1.0–3.6)
Monocytes Absolute: 0.8 10*3/uL (ref 0.2–1.0)
Monocytes Relative: 6 %
NEUTROS PCT: 86 %
Neutro Abs: 10.6 10*3/uL — ABNORMAL HIGH (ref 1.4–6.5)

## 2017-05-26 LAB — CBC
HCT: 44.5 % (ref 40.0–52.0)
Hemoglobin: 14.9 g/dL (ref 13.0–18.0)
MCH: 30.5 pg (ref 26.0–34.0)
MCHC: 33.4 g/dL (ref 32.0–36.0)
MCV: 91.2 fL (ref 80.0–100.0)
PLATELETS: 280 10*3/uL (ref 150–440)
RBC: 4.88 MIL/uL (ref 4.40–5.90)
RDW: 13.5 % (ref 11.5–14.5)
WBC: 12.5 10*3/uL — ABNORMAL HIGH (ref 3.8–10.6)

## 2017-05-26 LAB — TROPONIN I: Troponin I: <0.03 ng/mL (ref <0.03)

## 2017-05-26 MED ORDER — HYDROMORPHONE HCL 1 MG/ML IJ SOLN: 0.5000 mg | Freq: Once | INTRAMUSCULAR | Status: DC

## 2017-05-26 MED ORDER — DEXAMETHASONE SODIUM PHOSPHATE 10 MG/ML IJ SOLN
10.0000 mg | Freq: Once | INTRAMUSCULAR | Status: AC
  Administered 2017-05-26: 10 mg via INTRAVENOUS
  Filled 2017-05-26: qty 1

## 2017-05-26 MED ORDER — LORAZEPAM 2 MG/ML IJ SOLN
0.5000 mg | Freq: Once | INTRAMUSCULAR | Status: AC
  Administered 2017-05-26: 0.5 mg via INTRAVENOUS
  Filled 2017-05-26: qty 1

## 2017-05-26 MED ORDER — GADOBENATE DIMEGLUMINE 529 MG/ML IV SOLN
14.0000 mL | Freq: Once | INTRAVENOUS | Status: AC | PRN
  Administered 2017-05-26: 14 mL via INTRAVENOUS

## 2017-05-26 MED ORDER — HYDROMORPHONE HCL 1 MG/ML IJ SOLN
1.0000 mg | Freq: Once | INTRAMUSCULAR | Status: AC
  Administered 2017-05-26: 1 mg via INTRAVENOUS
  Filled 2017-05-26: qty 1

## 2017-05-26 NOTE — ED Notes (Signed)
SOC  REPORT   GIVEN  TO MD

## 2017-05-26 NOTE — ED Provider Notes (Signed)
[facility] Emergency Department Provider Note       Time seen: ----------------------------------------- 7:53 AM on 05/26/2017 -----------------------------------------     I have reviewed the triage vital signs and the nursing notes.   HISTORY   Chief Complaint Weakness    HPI Roger Sparks. is a 56 y.o. male with a history of previous right arm weakness who presents to the ED for difficulty walking. Patient describes trouble using the left leg. He states symptoms started somewhere after 3 AM. He reports a history of prostate cancer with metastases to the bone and had a stroke as a child. He was last known normal at 2 AM. He has not had this happen before. He denies any symptoms above the waist, denies any trouble with his vision or swallowing.  Past Medical History:  Diagnosis Date  . Cancer of prostate ([place]) 12/09/2014  . Neuromuscular disorder ([place])    brain tumor at 56 years of age  . Stroke [facility])    at 56 years of age    Patient Active Problem List   Diagnosis Date Noted  . Prostate cancer metastatic to bone ([place]) 12/09/2014  . Brain tumor, glioma ([place]) 07/16/2014  . Right arm weakness 07/16/2014  . Cerebral seizure 07/16/2014  . Other symptoms and signs involving the musculoskeletal system 07/16/2014  . Tear film insufficiency 11/10/2011  . Trichiasis of eyelid 11/10/2011    History reviewed. No pertinent surgical history.  Allergies Patient has no known allergies.  Social History Social History  Substance Use Topics  . Smoking status: Current Every Day Smoker    Packs/day: 0.50    Types: Cigarettes  . Smokeless tobacco: Never Used  . Alcohol use No    Review of Systems Constitutional: Negative for fever. Eyes: Negative for vision changes ENT:  Negative for congestion, sore throat Cardiovascular: Negative for chest pain. Respiratory: Negative for shortness of breath. Gastrointestinal: Negative for abdominal pain,  vomiting and diarrhea. Genitourinary: Negative for dysuria. Musculoskeletal: Negative for back pain. Skin: Negative for rash. Neurological: Negative for headaches, positive for left leg weakness and difficulty walking  All systems negative/normal/unremarkable except as stated in the HPI  ____________________________________________   PHYSICAL EXAM:  VITAL SIGNS: ED Triage Vitals  Enc Vitals Group     BP 05/26/17 0734 (!) 190/104     Pulse Rate 05/26/17 0734 84     Resp 05/26/17 0734 18     Temp 05/26/17 0734 97.6 F (36.4 C)     Temp Source 05/26/17 0734 Oral     SpO2 05/26/17 0734 96 %     Weight 05/26/17 0733 152 lb (68.9 kg)     Height 05/26/17 0733 5\' 6"  (1.676 m)     Head Circumference --      Peak Flow --      Pain Score 05/26/17 0733 2     Pain Loc --      Pain Edu? --      Excl. in [place]? --     Constitutional: Alert and oriented. Well appearing and in no distress. Eyes: Conjunctivae are normal. Normal extraocular movements. ENT   Head: Normocephalic and atraumatic.   Nose: No congestion/rhinnorhea.   Mouth/Throat: Mucous membranes are moist.   Neck: No stridor. Cardiovascular: Normal rate, regular rhythm. No murmurs, rubs, or gallops. Respiratory: Normal respiratory effort without tachypnea nor retractions. Breath sounds are clear and equal bilaterally. No wheezes/rales/rhonchi. Gastrointestinal: Soft and nontender. Normal bowel sounds Musculoskeletal: Nontender with normal range of motion  in extremities. No lower extremity tenderness nor edema. Neurologic:  Normal speech and language. strength is normal in the upper and lower extremities. Previous deformity of the right hand. Sensation appears normal, cranial nerves are normal. Unsteady gait, clonus, abnormal gait, patient walks with his tiptoes. Skin:  Skin is warm, dry and intact. No rash noted. Psychiatric: Mood and affect are normal. Speech and behavior are normal.   ____________________________________________  EKG: Interpreted by me.sinus rhythm rate of 72 bpm, LVH, normal QT, normal axis.  ____________________________________________  ED COURSE:  Pertinent labs & imaging results that were available during my care of the patient were reviewed by me and considered in my medical decision making (see chart for details). Patient presents for difficulty walking, we will assess with labs and imaging as indicated.  recent note from his oncologist: 1. Stage IV prostate cancer Gleason grade 8 with metastases to liver and bone: Bone scan results from December 30, 2016 reviewed independently with slightly progressive disease particularly in his right ribs consistent with his current complaint of right flank pain. He has now completed palliative XRT with improvement of his pain. After lengthy discussion with the patient, he still does not wish to pursue chemotherapy. He also was scheduled for  [facility] Koller, but now is refusing this. He is currently on Zytiga, but is unclear his compliance and the actual dose he is taking. He only takes his prednisone occasionally. Agreed to Lupron today and received 22.5 mg. Return to clinic in 3 months with repeat laboratory work attenuation of Lupron, and further evaluation.  2. Bony lesions: Patient is refusing Xgeva or Zometa. XRT as above. 3. Hypokalemia: Patient's potassium remains persistently low. He refuses to take oral or IV supplementation. 4. Urinary frequency: Likely secondary to enlarged prostate. Patient has Flomax listed in his medications, but is unclear his compliance. Continue follow-up with urology. 5. Flank pain: Improved. Continue current dose of oxycodone. 6. Neuropathy: Unclear etiology. Monitor. Does not appear related to his malignancy.  Clinical Course as of May 26 1304  Tue May 26, 2017  1031 patient with urinary retention with almost a liter of urine in his bladder. Findings are concerning for cauda equina  syndrome or spinal cord compression. We will progress with MRI of the spine  [JW]  1225 IMPRESSION: Metastatic disease within the thoracic spine as outlined above. There is extensive extraosseous tumor arising from the posterior elements at T7, encroaching upon the spinal canal in compressing the thecal sac and spinal cord. Soft tissue tumor involves the foramina at T6-7 and T7-8 right more than left.  Early ventral epidural tumor at T9 but without neural compression.  Pathologic fracture at T12 with superior endplate loss of height of 10% but no neural encroachment.  Call report will be documented after the lumbar exam.     [JW]  1246 I have discussed with Dr. Aris Lot from neurosurgery who recommends transfer to [facility].  [JW]    Clinical Course User Index [JW] Earleen Newport, MD   Procedures ____________________________________________   LABS (pertinent positives/negatives)  Labs Reviewed  CBC - Abnormal; Notable for the following:       Result Value   WBC 12.5 (*)    All other components within normal limits  DIFFERENTIAL - Abnormal; Notable for the following:    Neutro Abs 10.6 (*)    All other components within normal limits  COMPREHENSIVE METABOLIC PANEL - Abnormal; Notable for the following:    Potassium 3.3 (*)    Chloride 97 (*)  Glucose, Bld 121 (*)    ALT 12 (*)    Alkaline Phosphatase 164 (*)    All other components within normal limits  TROPONIN I  URINE DRUG SCREEN, QUALITATIVE ([facility] ONLY)  CBG MONITORING, ED   CRITICAL CARE Performed by: Earleen Newport   Total critical care time: 30 minutes  Critical care time was exclusive of separately billable procedures and treating other patients.  Critical care was necessary to treat or prevent imminent or life-threatening deterioration.  Critical care was time spent personally by me on the following activities: development of treatment plan with patient and/or surrogate as well as  nursing, discussions with consultants, evaluation of patient's response to treatment, examination of patient, obtaining history from patient or surrogate, ordering and performing treatments and interventions, ordering and review of laboratory studies, ordering and review of radiographic studies, pulse oximetry and re-evaluation of patient's condition.  RADIOLOGY Images were viewed by me  CT head IMPRESSION: 1. No acute intracranial hemorrhage or acute cortically based infarct. ASPECTS is 10. 2. Chronic 2.3 cm rounded mixed density mass along the caudal left thalamus is stable since 2012. 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 3. Superimposed mix of chronic posttraumatic and postoperative changes to both hemispheres, including a stable chronic left side CSF hygroma. IMPRESSION: 1. Acute or subacute mild compression deformity of the anterior superior aspect of T12 vertebral body. 2. No abnormality of the lumbar spine other than mild degenerative Change. IMPRESSION: 1. Slight irregularity of the cortex of the right posterolateral tenth rib could indicate nondisplaced fracture. Mild right basilar linear atelectasis is noted. 2. Old fractures of the right posterolateral fifth, sixth, and seventh ribs. ____________________________________________  DIFFERENTIAL DIAGNOSIS   CVA, neuromuscular disorder, seizure, intoxication, metastatic disease to the spine   FINAL ASSESSMENT AND PLAN  gait disturbance, spinal cord compression secondary to T12 metastasis  Plan: Patient had presented for difficulty walking. Patients labs reveals a leukocytosis but otherwise were not concerning. Patients imaging was concerning for a T12 fracture. Given his urinary retention and gait disturbance or concern for cauda equina syndromeor spinal cord compression. He was sent for MRI of his thoracic  and lumbar spine which revealed a T12 metastasis with spinal cord compression. I discussed with neurosurgery here who recommended transfer to [facility]. He was given Decadron here and currently his pain is under control.   Earleen Newport, MD   Note: This note was generated in part or whole with voice recognition software. Voice recognition is usually quite accurate but there are transcription errors that can and very often do occur. I apologize for any typographical errors that were not detected and corrected.     Earleen Newport, MD 05/26/17 [phone]

## 2017-05-26 NOTE — ED Triage Notes (Addendum)
States at 3 AM this morning he suddenly could not walk, states hx of prostrate cancer with mets to the bone, states hx of a CVA as a child, states last known normal was 2 am

## 2017-05-26 NOTE — Progress Notes (Signed)
   05/26/17 0750  Clinical Encounter Type  Visited With Family  Visit Type Initial;ED  Referral From Nurse  Consult/Referral To Chaplain  Spiritual Encounters  Spiritual Needs Emotional  Stress Factors  Patient Stress Factors Health changes   Visited with pts wife and provided emotional support.  She was able to name pts change in physical abilities as "frustrating."  [place]

## 2017-05-26 NOTE — ED Notes (Signed)
Bladder Scan 933 ml by ED tech. MD notified.

## 2017-05-26 NOTE — ED Notes (Signed)
EMTALA reviewed. 

## 2017-05-26 NOTE — ED Notes (Signed)
CODE  STROKE  WITH LVO  CALLED  TO [phone]

## 2017-05-27 DIAGNOSIS — R0602 Shortness of breath: Secondary | ICD-10-CM | POA: Diagnosis not present

## 2017-05-27 DIAGNOSIS — I2510 Atherosclerotic heart disease of native coronary artery without angina pectoris: Secondary | ICD-10-CM | POA: Diagnosis not present

## 2017-05-27 DIAGNOSIS — R079 Chest pain, unspecified: Secondary | ICD-10-CM | POA: Diagnosis not present

## 2017-06-02 ENCOUNTER — Telehealth: Payer: Self-pay | Admitting: *Deleted

## 2017-06-02 MED ORDER — POLYVINYL ALCOHOL 1.4 % OP SOLN
1.00 | OPHTHALMIC | Status: DC
Start: ? — End: 2017-06-02

## 2017-06-02 MED ORDER — ACETAMINOPHEN 325 MG PO TABS
975.00 | ORAL_TABLET | ORAL | Status: DC
Start: 2017-06-02 — End: 2017-06-02

## 2017-06-02 MED ORDER — ENOXAPARIN SODIUM 40 MG/0.4ML ~~LOC~~ SOLN
40.00 | SUBCUTANEOUS | Status: DC
Start: 2017-06-03 — End: 2017-06-02

## 2017-06-02 MED ORDER — HYDROMORPHONE HCL 1 MG/ML IJ SOLN
.50 | INTRAMUSCULAR | Status: DC
Start: ? — End: 2017-06-02

## 2017-06-02 MED ORDER — OXYCODONE HCL 5 MG PO TABS
5.00 | ORAL_TABLET | ORAL | Status: DC
Start: ? — End: 2017-06-02

## 2017-06-02 MED ORDER — TIZANIDINE HCL 4 MG PO TABS
2.00 | ORAL_TABLET | ORAL | Status: DC
Start: ? — End: 2017-06-02

## 2017-06-02 MED ORDER — BISACODYL 10 MG RE SUPP
10.00 | RECTAL | Status: DC
Start: ? — End: 2017-06-02

## 2017-06-02 MED ORDER — ONDANSETRON HCL 4 MG/2ML IJ SOLN
4.00 | INTRAMUSCULAR | Status: DC
Start: ? — End: 2017-06-02

## 2017-06-02 MED ORDER — POLYETHYLENE GLYCOL 3350 17 G PO PACK
17.00 | PACK | ORAL | Status: DC
Start: 2017-06-03 — End: 2017-06-02

## 2017-06-02 MED ORDER — SENNOSIDES-DOCUSATE SODIUM 8.6-50 MG PO TABS
2.00 | ORAL_TABLET | ORAL | Status: DC
Start: 2017-06-02 — End: 2017-06-02

## 2017-06-02 NOTE — Telephone Encounter (Signed)
Per Dr Grayland Ormond, he will sign orders for physical therapy/OT

## 2017-06-02 NOTE — Telephone Encounter (Signed)
[name] referred patient for physical therapy /OT. Roger Sparks is asking if Dr Grayland Ormond will sign orders for it. Please advise

## 2017-06-02 NOTE — Telephone Encounter (Signed)
Meghan with [facility] informed

## 2017-06-03 DIAGNOSIS — C7949 Secondary malignant neoplasm of other parts of nervous system: Secondary | ICD-10-CM | POA: Diagnosis not present

## 2017-06-03 DIAGNOSIS — Z483 Aftercare following surgery for neoplasm: Secondary | ICD-10-CM | POA: Diagnosis not present

## 2017-06-04 DIAGNOSIS — Z483 Aftercare following surgery for neoplasm: Secondary | ICD-10-CM | POA: Diagnosis not present

## 2017-06-04 DIAGNOSIS — C7949 Secondary malignant neoplasm of other parts of nervous system: Secondary | ICD-10-CM | POA: Diagnosis not present

## 2017-06-05 ENCOUNTER — Telehealth: Payer: Self-pay | Admitting: *Deleted

## 2017-06-05 NOTE — Telephone Encounter (Signed)
[facility] physical therapist called to ask when patients back drain will be removed please call [phone].  OT also called to report that they will do visits once a week for 3 weeks or until goals are met

## 2017-06-05 NOTE — Telephone Encounter (Signed)
Informed physical therapist to call [facility] for drain removal information. Left message on voice mail

## 2017-06-05 NOTE — Telephone Encounter (Signed)
Roger Sparks will have to reach out to patients surgeon at [facility] for that information.

## 2017-06-05 NOTE — Telephone Encounter (Signed)
Agreed -

## 2017-06-08 DIAGNOSIS — Z483 Aftercare following surgery for neoplasm: Secondary | ICD-10-CM | POA: Diagnosis not present

## 2017-06-08 DIAGNOSIS — G9520 Unspecified cord compression: Secondary | ICD-10-CM | POA: Diagnosis not present

## 2017-06-08 DIAGNOSIS — C7949 Secondary malignant neoplasm of other parts of nervous system: Secondary | ICD-10-CM | POA: Diagnosis not present

## 2017-06-10 DIAGNOSIS — C7949 Secondary malignant neoplasm of other parts of nervous system: Secondary | ICD-10-CM | POA: Diagnosis not present

## 2017-06-10 DIAGNOSIS — Z483 Aftercare following surgery for neoplasm: Secondary | ICD-10-CM | POA: Diagnosis not present

## 2017-06-11 DIAGNOSIS — Z483 Aftercare following surgery for neoplasm: Secondary | ICD-10-CM | POA: Diagnosis not present

## 2017-06-11 DIAGNOSIS — C7949 Secondary malignant neoplasm of other parts of nervous system: Secondary | ICD-10-CM | POA: Diagnosis not present

## 2017-06-15 ENCOUNTER — Other Ambulatory Visit: Payer: Self-pay | Admitting: *Deleted

## 2017-06-15 DIAGNOSIS — Z483 Aftercare following surgery for neoplasm: Secondary | ICD-10-CM | POA: Diagnosis not present

## 2017-06-15 DIAGNOSIS — C7949 Secondary malignant neoplasm of other parts of nervous system: Secondary | ICD-10-CM | POA: Diagnosis not present

## 2017-06-15 MED ORDER — OXYCODONE HCL 10 MG PO TABS: 10.0000 mg | ORAL_TABLET | Freq: Four times a day (QID) | ORAL | 0 refills | Status: DC | PRN

## 2017-06-15 NOTE — Telephone Encounter (Signed)
Per Dr Grayland Ormond, not comfortable refilling pain medicine when he just had surgery at another facility and he has not been seen here since then. Recommended she call [name]

## 2017-06-16 DIAGNOSIS — Z483 Aftercare following surgery for neoplasm: Secondary | ICD-10-CM | POA: Diagnosis not present

## 2017-06-16 DIAGNOSIS — C7949 Secondary malignant neoplasm of other parts of nervous system: Secondary | ICD-10-CM | POA: Diagnosis not present

## 2017-06-17 DIAGNOSIS — C7949 Secondary malignant neoplasm of other parts of nervous system: Secondary | ICD-10-CM | POA: Diagnosis not present

## 2017-06-17 DIAGNOSIS — Z483 Aftercare following surgery for neoplasm: Secondary | ICD-10-CM | POA: Diagnosis not present

## 2017-06-22 DIAGNOSIS — C7949 Secondary malignant neoplasm of other parts of nervous system: Secondary | ICD-10-CM | POA: Diagnosis not present

## 2017-06-22 DIAGNOSIS — Z483 Aftercare following surgery for neoplasm: Secondary | ICD-10-CM | POA: Diagnosis not present

## 2017-06-25 DIAGNOSIS — C7949 Secondary malignant neoplasm of other parts of nervous system: Secondary | ICD-10-CM | POA: Diagnosis not present

## 2017-06-25 DIAGNOSIS — Z483 Aftercare following surgery for neoplasm: Secondary | ICD-10-CM | POA: Diagnosis not present

## 2017-07-02 DIAGNOSIS — C7951 Secondary malignant neoplasm of bone: Secondary | ICD-10-CM | POA: Diagnosis not present

## 2017-07-03 DIAGNOSIS — C7949 Secondary malignant neoplasm of other parts of nervous system: Secondary | ICD-10-CM | POA: Diagnosis not present

## 2017-07-03 DIAGNOSIS — G9520 Unspecified cord compression: Secondary | ICD-10-CM | POA: Diagnosis not present

## 2017-07-03 DIAGNOSIS — Z515 Encounter for palliative care: Secondary | ICD-10-CM | POA: Diagnosis not present

## 2017-07-03 DIAGNOSIS — R5381 Other malaise: Secondary | ICD-10-CM | POA: Diagnosis not present

## 2017-07-03 DIAGNOSIS — Z483 Aftercare following surgery for neoplasm: Secondary | ICD-10-CM | POA: Diagnosis not present

## 2017-07-03 DIAGNOSIS — G893 Neoplasm related pain (acute) (chronic): Secondary | ICD-10-CM | POA: Diagnosis not present

## 2017-07-03 DIAGNOSIS — Z7189 Other specified counseling: Secondary | ICD-10-CM | POA: Diagnosis not present

## 2017-07-03 DIAGNOSIS — C7951 Secondary malignant neoplasm of bone: Secondary | ICD-10-CM | POA: Diagnosis not present

## 2017-07-07 ENCOUNTER — Inpatient Hospital Stay: Payer: Medicare Other

## 2017-07-07 ENCOUNTER — Telehealth: Payer: Self-pay | Admitting: Oncology

## 2017-07-07 ENCOUNTER — Inpatient Hospital Stay: Payer: Medicare Other | Admitting: Oncology

## 2017-07-07 NOTE — Telephone Encounter (Signed)
Called and spoke with Roger Sparks and he stated he had surgery on his back 4 weeks ago and cannot move. He is in a lot of pain and will call back to r\s his appts once he has healed which should be in 4 more weeks.

## 2017-07-10 DIAGNOSIS — Z483 Aftercare following surgery for neoplasm: Secondary | ICD-10-CM | POA: Diagnosis not present

## 2017-07-10 DIAGNOSIS — C7949 Secondary malignant neoplasm of other parts of nervous system: Secondary | ICD-10-CM | POA: Diagnosis not present

## 2017-07-17 DIAGNOSIS — C7949 Secondary malignant neoplasm of other parts of nervous system: Secondary | ICD-10-CM | POA: Diagnosis not present

## 2017-07-17 DIAGNOSIS — Z483 Aftercare following surgery for neoplasm: Secondary | ICD-10-CM | POA: Diagnosis not present

## 2017-07-27 ENCOUNTER — Telehealth: Payer: Self-pay | Admitting: *Deleted

## 2017-07-27 NOTE — Telephone Encounter (Signed)
Wife called and reports that patient is passing blood in urine after surgery at [name] to pace something in his spine.  Advised her to take him to ER for evaluaton

## 2017-07-31 ENCOUNTER — Other Ambulatory Visit: Payer: Self-pay | Admitting: *Deleted

## 2017-07-31 DIAGNOSIS — G9520 Unspecified cord compression: Secondary | ICD-10-CM | POA: Diagnosis not present

## 2017-07-31 DIAGNOSIS — C61 Malignant neoplasm of prostate: Secondary | ICD-10-CM

## 2017-07-31 DIAGNOSIS — G893 Neoplasm related pain (acute) (chronic): Secondary | ICD-10-CM | POA: Diagnosis not present

## 2017-07-31 DIAGNOSIS — C7951 Secondary malignant neoplasm of bone: Secondary | ICD-10-CM | POA: Diagnosis not present

## 2017-07-31 DIAGNOSIS — R319 Hematuria, unspecified: Secondary | ICD-10-CM | POA: Diagnosis not present

## 2017-07-31 DIAGNOSIS — Z515 Encounter for palliative care: Secondary | ICD-10-CM | POA: Diagnosis not present

## 2017-08-09 NOTE — Progress Notes (Deleted)
[place]  [phone]) [phone]  Fax:([phone]) [place] DOB: Jun 25, 1961  MR#: [id number]  DPO#:[id number]  Patient Care Team: Patient, No Pcp Per as PCP - General (General Practice) Clent Jacks, RN as Registered Nurse  CHIEF COMPLAINT:  Stage IV prostate cancer Gleason grade 8 with metastases to liver and bone.  INTERVAL HISTORY:  Patient returns to clinic today for further evaluation. He continues to have flank pain, but has significantly improved since completing XRT. He also continues to have numbness in his left side from his abdomen to his toes. He has no other neurologic complaints. He continues to tolerate Zytiga well although it is unclear if his compliance. He does not take prednisone regularly as prescribed. He denies any other pain. He has a good appetite and denies weight loss. He denies any recent fevers or illnesses. He denies any nausea, vomiting, constipation, or diarrhea. He does admit to urinary frequency especially at night, but no other urinary complaints. Patient otherwise feels well and offers no further specific complaints.   REVIEW OF SYSTEMS:   Review of Systems  Constitutional: Negative.  Negative for fever, malaise/fatigue and weight loss.  Respiratory: Negative.  Negative for cough and shortness of breath.   Cardiovascular: Negative.  Negative for chest pain and leg swelling.  Gastrointestinal: Negative.  Negative for abdominal pain.  Genitourinary: Positive for flank pain and frequency.  Skin: Negative.  Negative for rash.  Neurological: Positive for sensory change. Negative for weakness.  Psychiatric/Behavioral: Negative.  The patient is not nervous/anxious.     As per HPI. Otherwise, a complete review of systems is negative.  ONCOLOGY HISTORY: Oncology History   Carcinoma prostate adenocarcinoma in multiple fragments.  Gleason grade 8.  Involving neutral space.  Large duct and S cylinder-type.  Poorly  differentiated.  Lateral neck involvement.Marland Kitchen PSAs low 2.4.  CT scan of abdomen was abnormal with multiple liver lesions.  Diagnosis in April of 2016 Clinical staging T1 cN0 M1 stage IV disease 2, CT-guided liver biopsies consistent with metastases from the prostate cancer   3.  Patient refused any chemotherapy or Lupron Started on ZYTIGA and prednisone from May of 2016     Prostate cancer metastatic to bone [facility])   12/09/2014 Initial Diagnosis    Cancer of prostate       PAST MEDICAL HISTORY: Past Medical History:  Diagnosis Date  . Cancer of prostate ([place]) 12/09/2014  . Cerebral seizure   . Neuromuscular disorder ([place])    brain tumor at 57 years of age  . Stroke ([place])    at 57 years of age    PAST SURGICAL HISTORY: No past surgical history on file.  FAMILY HISTORY: Reviewed and unchanged. No reported history of malignancy or chronic disease.  ADVANCED DIRECTIVES:    HEALTH MAINTENANCE: Social History   Tobacco Use  . Smoking status: Current Every Day Smoker    Packs/day: 0.50    Types: Cigarettes  . Smokeless tobacco: Never Used  Substance Use Topics  . Alcohol use: No  . Drug use: No      No Known Allergies  Current Outpatient Medications  Medication Sig Dispense Refill  . abiraterone Acetate (ZYTIGA) 250 MG tablet Take 4 tablets (1,000 mg total) by mouth daily. 120 tablet 3  . gabapentin (NEURONTIN) 300 MG capsule Take 1 capsule (300 mg total) by mouth at bedtime. (Patient not taking: Reported on 03/25/2017) 30 capsule 2  . tamsulosin (FLOMAX) 0.4 MG CAPS  capsule Take 1 capsule (0.4 mg total) by mouth daily. (Patient not taking: Reported on 04/07/2017) 7 capsule 0   No current facility-administered medications for this visit.     OBJECTIVE: There were no vitals taken for this visit.   There is no height or weight on file to calculate BMI.    ECOG FS:0 - Asymptomatic  General: Well-developed, well-nourished, no acute distress. Lungs: Clear to auscultation  bilaterally. Heart: Regular rate and rhythm. No rubs, murmurs, or gallops.e. Abdomen: Soft, nontender, nondistended. No organomegaly noted, normoactive bowel sounds. Musculoskeletal: No edema, cyanosis, or clubbing. Mild palpation to tenderness on right flank. Neuro: Alert, answering all questions appropriately. Cranial nerves grossly intact. Skin: No rashes or petechiae noted. Psych: Normal affect. Lymphatics: No cervical, clavicular LAD.   LAB RESULTS:  CBC    Component Value Date/Time   WBC 12.5 (H) 05/26/2017 0745   RBC 4.88 05/26/2017 0745   HGB 14.9 05/26/2017 0745   HGB 15.1 11/08/2014 1009   HCT 44.5 05/26/2017 0745   HCT 44.5 11/08/2014 1009   PLT 280 05/26/2017 0745   PLT 250 11/08/2014 1009   MCV 91.2 05/26/2017 0745   MCV 92 11/08/2014 1009   MCH 30.5 05/26/2017 0745   MCHC 33.4 05/26/2017 0745   RDW 13.5 05/26/2017 0745   RDW 13.2 11/08/2014 1009   LYMPHSABS 1.0 05/26/2017 0745   LYMPHSABS 2.0 11/08/2014 1009   MONOABS 0.8 05/26/2017 0745   MONOABS 0.8 11/08/2014 1009   EOSABS 0.0 05/26/2017 0745   EOSABS 0.1 11/08/2014 1009   BASOSABS 0.0 05/26/2017 0745   BASOSABS 0.1 11/08/2014 1009   BMET    Component Value Date/Time   NA 135 05/26/2017 0745   NA 134 (L) 11/08/2014 1009   K 3.3 (L) 05/26/2017 0745   K 3.7 11/08/2014 1009   CL 97 (L) 05/26/2017 0745   CL 105 11/08/2014 1009   CO2 29 05/26/2017 0745   CO2 22 11/08/2014 1009   GLUCOSE 121 (H) 05/26/2017 0745   GLUCOSE 111 (H) 11/08/2014 1009   BUN 12 05/26/2017 0745   BUN 12 11/08/2014 1009   CREATININE 0.71 05/26/2017 0745   CREATININE 0.59 (L) 11/08/2014 1009   CALCIUM 9.6 05/26/2017 0745   CALCIUM 9.2 11/08/2014 1009   GFRNONAA >60 05/26/2017 0745   GFRNONAA >60 11/08/2014 1009   GFRAA >60 05/26/2017 0745   GFRAA >60 11/08/2014 1009   Lab Results  Component Value Date   PSA 3.90 12/16/2016     STUDIES: No results found.  ASSESSMENT:  Stage IV prostate cancer Gleason grade 8 with  metastases to liver and bone.  PLAN:    1. Stage IV prostate cancer Gleason grade 8 with metastases to liver and bone: Bone scan results from December 30, 2016 reviewed independently with slightly progressive disease particularly in his right ribs consistent with his current complaint of right flank pain. He has now completed palliative XRT with improvement of his pain. After lengthy discussion with the patient, he still does not wish to pursue chemotherapy. He also was scheduled for  [facility] Koller, but now is refusing this. He is currently on Zytiga, but is unclear his compliance and the actual dose he is taking. He only takes his prednisone occasionally. Agreed to Lupron today and received 22.5 mg. Return to clinic in 3 months with repeat laboratory work attenuation of Lupron, and further evaluation.  2. Bony lesions: Patient is refusing Xgeva or Zometa. XRT as above. 3. Hypokalemia: Patient's potassium remains persistently low. He  refuses to take oral or IV supplementation. 4. Urinary frequency: Likely secondary to enlarged prostate. Patient has Flomax listed in his medications, but is unclear his compliance. Continue follow-up with urology. 5. Flank pain: Improved. Continue current dose of oxycodone. 6. Neuropathy: Unclear etiology. Monitor. Does not appear related to his malignancy.  Patient expressed understanding and was in agreement with this plan. He also understands that He can call clinic at any time with any questions, concerns, or complaints.    Cancer of prostate [facility])   Staging form: Prostate, AJCC 7th Edition     Clinical: Stage IV (T1c, N0, M1, PSA: Less than 10, Gleason 8-10 - Poorly differentiated/undifferentiated (marked anaplasia)) - Signed by Forest Gleason, MD on 12/09/2014   [name], MD   08/09/2017 3:01 PM

## 2017-08-11 ENCOUNTER — Encounter: Admission: RE | Admit: 2017-08-11 | Payer: Medicare Other | Source: Ambulatory Visit

## 2017-08-13 ENCOUNTER — Ambulatory Visit
Admission: RE | Admit: 2017-08-13 | Discharge: 2017-08-13 | Disposition: A | Discharge status: Home / Self Care | Payer: Medicare Other | Source: Ambulatory Visit | Attending: Oncology | Admitting: Oncology

## 2017-08-13 DIAGNOSIS — I2510 Atherosclerotic heart disease of native coronary artery without angina pectoris: Secondary | ICD-10-CM | POA: Diagnosis not present

## 2017-08-13 DIAGNOSIS — R911 Solitary pulmonary nodule: Secondary | ICD-10-CM | POA: Insufficient documentation

## 2017-08-13 DIAGNOSIS — C7951 Secondary malignant neoplasm of bone: Secondary | ICD-10-CM | POA: Diagnosis not present

## 2017-08-13 DIAGNOSIS — C61 Malignant neoplasm of prostate: Secondary | ICD-10-CM | POA: Insufficient documentation

## 2017-08-13 DIAGNOSIS — I700 Atherosclerosis of aorta: Secondary | ICD-10-CM | POA: Insufficient documentation

## 2017-08-13 DIAGNOSIS — C787 Secondary malignant neoplasm of liver and intrahepatic bile duct: Secondary | ICD-10-CM | POA: Insufficient documentation

## 2017-08-14 ENCOUNTER — Ambulatory Visit: Payer: Medicare Other | Admitting: Oncology

## 2017-08-17 ENCOUNTER — Telehealth: Payer: Self-pay | Admitting: *Deleted

## 2017-08-17 NOTE — Telephone Encounter (Signed)
Wife called asking for [place] to refill his pain medicine which has not been filled since November for back pain that started 3 weeks ago. Patient not seen here since Sept as he was getting care at [facility] with surgery. He has no follow up appointment scheduled with [place], though the wife states [name] has released him back to our care. I explained to her that if he needs medicine tonight, he will need to go to the ER for care since we have not seen him in so long. I explained with the lateness of the day it would be tomorrow before I could get back to her with an answer. She verbalized understanding

## 2017-08-17 NOTE — Telephone Encounter (Signed)
[place], please schedule patient follow up appointment first available with Dr Grayland Ormond

## 2017-08-17 NOTE — Telephone Encounter (Signed)
He has missed multiple appointments recently.  I cannot prescribe him any medications, including narcotics, until he is evaluated in clinic.

## 2017-08-17 NOTE — Telephone Encounter (Signed)
I called patient and discussed the fact that Dr Grayland Ormond will not prescribe narcotics for him as he has not been seen and to go to Er or a walk in clinic for evaluation tonight and that our scheduler will call him for an appointment sometime tomorrow. He made remarks that ER won't do anything for him and I explained that he can go to Urgent care or some place else for care, but until he sees Dr Grayland Ormond, we cannot prescribe for him

## 2017-08-18 ENCOUNTER — Other Ambulatory Visit: Payer: Medicare Other

## 2017-08-18 ENCOUNTER — Ambulatory Visit: Payer: Medicare Other

## 2017-08-18 NOTE — Telephone Encounter (Signed)
Called and r\s appt with pt's wife. Instructed wife on the [facility]'s policy for continuous no show appts. She verbalized her understanding. Pt is scheduled for 08/21/17 @ 2:45.

## 2017-08-20 ENCOUNTER — Inpatient Hospital Stay: Payer: Medicare Other | Attending: Oncology | Admitting: Oncology

## 2017-08-20 ENCOUNTER — Ambulatory Visit: Payer: Medicare Other | Admitting: Oncology

## 2017-08-20 VITALS — BP 118/77 | HR 107 | Temp 98.7°F | Resp 18 | Wt 131.6 lb

## 2017-08-20 DIAGNOSIS — R350 Frequency of micturition: Secondary | ICD-10-CM

## 2017-08-20 DIAGNOSIS — C787 Secondary malignant neoplasm of liver and intrahepatic bile duct: Secondary | ICD-10-CM | POA: Diagnosis not present

## 2017-08-20 DIAGNOSIS — C7951 Secondary malignant neoplasm of bone: Secondary | ICD-10-CM | POA: Insufficient documentation

## 2017-08-20 DIAGNOSIS — Z720 Tobacco use: Secondary | ICD-10-CM | POA: Insufficient documentation

## 2017-08-20 DIAGNOSIS — R52 Pain, unspecified: Secondary | ICD-10-CM

## 2017-08-20 DIAGNOSIS — M899 Disorder of bone, unspecified: Secondary | ICD-10-CM | POA: Insufficient documentation

## 2017-08-20 DIAGNOSIS — C61 Malignant neoplasm of prostate: Secondary | ICD-10-CM | POA: Diagnosis not present

## 2017-08-20 DIAGNOSIS — E876 Hypokalemia: Secondary | ICD-10-CM | POA: Diagnosis not present

## 2017-08-20 MED ORDER — OXYCODONE HCL 10 MG PO TABS: 10.0000 mg | ORAL_TABLET | Freq: Three times a day (TID) | ORAL | 0 refills | Status: DC | PRN

## 2017-08-20 MED ORDER — MORPHINE SULFATE ER 30 MG PO TBCR: 30.0000 mg | EXTENDED_RELEASE_TABLET | Freq: Two times a day (BID) | ORAL | 0 refills | Status: DC

## 2017-08-21 NOTE — Progress Notes (Signed)
[place]  [phone]) [phone]  Fax:(336) [place] DOB: Jun 25, 1961  MR#: [id number]  HFW#:[id number]  Patient Care Team: Patient, No Pcp Per as PCP - General (General Practice) Clent Jacks, RN as Registered Nurse  CHIEF COMPLAINT:  Stage IV prostate cancer Gleason grade 8 with metastases to liver and bone.  INTERVAL HISTORY:  Patient was last evaluated in clinic in September 2018.  In fall 2018, patient presented with acute paralysis of his lower extremities secondary to vertebral fracture from a metastatic lesion and required emergent neurosurgical intervention and was transferred to [facility].  He has been seen multiple times at their oncology clinic as well as with palliative care.  Patient returns to clinic today to reestablish care at [facility].  He continues to have significant pain.  He has no new neurologic complaints.  He denies any recent fevers or illnesses.  He has a fair appetite and denies weight loss.  He has no chest pain or shortness of breath.  He denies any nausea, vomiting, constipation, or diarrhea. He does admit to urinary frequency and occasional hematuria. Patient offers no further specific complaints.   REVIEW OF SYSTEMS:   Review of Systems  Constitutional: Positive for malaise/fatigue. Negative for fever and weight loss.  Respiratory: Negative.  Negative for cough and shortness of breath.   Cardiovascular: Negative.  Negative for chest pain and leg swelling.  Gastrointestinal: Negative.  Negative for abdominal pain.  Genitourinary: Positive for flank pain, frequency and hematuria.  Musculoskeletal: Positive for back pain and neck pain.  Skin: Negative.  Negative for rash.  Neurological: Positive for sensory change, focal weakness and weakness. Negative for headaches.  Psychiatric/Behavioral: Negative.  The patient is not nervous/anxious.     As per HPI. Otherwise, a complete review of systems is  negative.  ONCOLOGY HISTORY: Oncology History   Carcinoma prostate adenocarcinoma in multiple fragments.  Gleason grade 8.  Involving neutral space.  Large duct and S cylinder-type.  Poorly differentiated.  Lateral neck involvement.Marland Kitchen PSAs low 2.4.  CT scan of abdomen was abnormal with multiple liver lesions.  Diagnosis in April of 2016 Clinical staging T1 cN0 M1 stage IV disease 2, CT-guided liver biopsies consistent with metastases from the prostate cancer   3.  Patient refused any chemotherapy or Lupron Started on ZYTIGA and prednisone from May of 2016     Prostate cancer metastatic to bone [facility])   12/09/2014 Initial Diagnosis    Cancer of prostate       PAST MEDICAL HISTORY: Past Medical History:  Diagnosis Date  . Cancer of prostate ([place]) 12/09/2014  . Cerebral seizure   . Neuromuscular disorder ([place])    brain tumor at 57 years of age  . Stroke ([place])    at 57 years of age    PAST SURGICAL HISTORY: No past surgical history on file.  FAMILY HISTORY: Reviewed and unchanged. No reported history of malignancy or chronic disease.  ADVANCED DIRECTIVES:    HEALTH MAINTENANCE: Social History   Tobacco Use  . Smoking status: Current Every Day Smoker    Packs/day: 0.50    Types: Cigarettes  . Smokeless tobacco: Never Used  Substance Use Topics  . Alcohol use: No  . Drug use: No      No Known Allergies  Current Outpatient Medications  Medication Sig Dispense Refill  . abiraterone Acetate (ZYTIGA) 250 MG tablet Take 4 tablets (1,000 mg total) by mouth daily. (Patient not taking:  Reported on 08/20/2017) 120 tablet 3  . gabapentin (NEURONTIN) 300 MG capsule Take 1 capsule (300 mg total) by mouth at bedtime. (Patient not taking: Reported on 03/25/2017) 30 capsule 2  . morphine (MS CONTIN) 30 MG 12 hr tablet Take 1 tablet (30 mg total) by mouth every 12 (twelve) hours. 60 tablet 0  . Oxycodone HCl 10 MG TABS Take 1 tablet (10 mg total) by mouth 3 (three) times daily as  needed. 90 tablet 0  . tamsulosin (FLOMAX) 0.4 MG CAPS capsule Take 1 capsule (0.4 mg total) by mouth daily. (Patient not taking: Reported on 04/07/2017) 7 capsule 0   No current facility-administered medications for this visit.     OBJECTIVE: BP 118/77 (BP Location: Left Arm, Patient Position: Sitting)   Pulse (!) 107   Temp 98.7 F (37.1 C) (Tympanic)   Resp 18   Wt 131 lb 9.6 oz (59.7 kg)   BMI 21.24 kg/m    Body mass index is 21.24 kg/m.    ECOG FS:0 - Asymptomatic  General: Well-developed, well-nourished, no acute distress. Lungs: Clear to auscultation bilaterally. Heart: Regular rate and rhythm. No rubs, murmurs, or gallops.e. Abdomen: Soft, nontender, nondistended. No organomegaly noted, normoactive bowel sounds. Musculoskeletal: No edema, cyanosis, or clubbing. Mild palpation to tenderness on right flank. Neuro: Alert, answering all questions appropriately. Cranial nerves grossly intact. Skin: No rashes or petechiae noted. Psych: Normal affect. Lymphatics: No cervical, clavicular LAD.   LAB RESULTS:  CBC    Component Value Date/Time   WBC 12.5 (H) 05/26/2017 0745   RBC 4.88 05/26/2017 0745   HGB 14.9 05/26/2017 0745   HGB 15.1 11/08/2014 1009   HCT 44.5 05/26/2017 0745   HCT 44.5 11/08/2014 1009   PLT 280 05/26/2017 0745   PLT 250 11/08/2014 1009   MCV 91.2 05/26/2017 0745   MCV 92 11/08/2014 1009   MCH 30.5 05/26/2017 0745   MCHC 33.4 05/26/2017 0745   RDW 13.5 05/26/2017 0745   RDW 13.2 11/08/2014 1009   LYMPHSABS 1.0 05/26/2017 0745   LYMPHSABS 2.0 11/08/2014 1009   MONOABS 0.8 05/26/2017 0745   MONOABS 0.8 11/08/2014 1009   EOSABS 0.0 05/26/2017 0745   EOSABS 0.1 11/08/2014 1009   BASOSABS 0.0 05/26/2017 0745   BASOSABS 0.1 11/08/2014 1009   BMET    Component Value Date/Time   NA 135 05/26/2017 0745   NA 134 (L) 11/08/2014 1009   K 3.3 (L) 05/26/2017 0745   K 3.7 11/08/2014 1009   CL 97 (L) 05/26/2017 0745   CL 105 11/08/2014 1009   CO2 29  05/26/2017 0745   CO2 22 11/08/2014 1009   GLUCOSE 121 (H) 05/26/2017 0745   GLUCOSE 111 (H) 11/08/2014 1009   BUN 12 05/26/2017 0745   BUN 12 11/08/2014 1009   CREATININE 0.71 05/26/2017 0745   CREATININE 0.59 (L) 11/08/2014 1009   CALCIUM 9.6 05/26/2017 0745   CALCIUM 9.2 11/08/2014 1009   GFRNONAA >60 05/26/2017 0745   GFRNONAA >60 11/08/2014 1009   GFRAA >60 05/26/2017 0745   GFRAA >60 11/08/2014 1009     STUDIES: No results found.  ASSESSMENT:  Stage IV prostate cancer Gleason grade 8 with metastases to liver and bone.  PLAN:    1. Stage IV prostate cancer Gleason grade 8 with metastases to liver and bone: Patient's CT scans from August 13, 2017 reviewed independently revealing significant progression of disease.  His most recent PSA completed [place] on July 31, 2017 was 19.5.  Patient continues to refuse treatment for his prostate cancer including palliative treatment using Zometa or Xgeva for his bony  disease.  Both hospice and outpatient palliative care consultation were offered, but patient states he does not want help and does not want people coming into his home.  No interventions are needed at this time.  Return to clinic in 4 weeks for further evaluation.  2. Bony lesions: Patient is refusing Xgeva or Zometa. 3. Hypokalemia: Patient's potassium remains persistently low. He refuses to take oral or IV supplementation. 4. Urinary frequency/hematuria: Likely secondary to enlarged prostate and invasive malignancy.  5.  Pain: Patient was given a prescription for 30 mg MS Contin every 12 hours as well as oxycodone 10 mg 3 times a day as needed.  Approximately 30 minutes was spent in discussion of which greater than 50% was consultation.  Patient expressed understanding and was in agreement with this plan. He also understands that He can call clinic at any time with any questions, concerns, or complaints.    Cancer of prostate [facility])   Staging form: Prostate, AJCC  7th Edition     Clinical: Stage IV (T1c, N0, M1, PSA: Less than 10, Gleason 8-10 - Poorly differentiated/undifferentiated (marked anaplasia)) - Signed by Forest Gleason, MD on 12/09/2014   [name], MD   08/21/2017 9:16 AM

## 2017-09-02 ENCOUNTER — Telehealth: Payer: Self-pay | Admitting: Urology

## 2017-09-02 NOTE — Telephone Encounter (Signed)
Pt is bleeding all the time, day and night.  His urine is orange looking.  His stomach has also been hurting.  Please give pt a call.  He saw Erlene Quan in 8/18.

## 2017-09-03 NOTE — Telephone Encounter (Signed)
Spoke with pt in reference to bleeding. Pt stated that he feels like he may have an infection. Offered pt nurse visit for today. Pt declined stating he didn't have a ride. Pt was added to the nurse schedule for tomorrow.

## 2017-09-04 ENCOUNTER — Ambulatory Visit (INDEPENDENT_AMBULATORY_CARE_PROVIDER_SITE_OTHER): Payer: Medicare Other

## 2017-09-04 VITALS — BP 109/76 | HR 116 | Ht 66.0 in | Wt 127.0 lb

## 2017-09-04 DIAGNOSIS — R310 Gross hematuria: Secondary | ICD-10-CM

## 2017-09-04 LAB — URINALYSIS, COMPLETE
Bilirubin, UA: NEGATIVE
Glucose, UA: NEGATIVE
Leukocytes, UA: NEGATIVE
NITRITE UA: NEGATIVE
PH UA: 6 (ref 5.0–7.5)
Specific Gravity, UA: 1.02 (ref 1.005–1.030)
UUROB: 0.2 mg/dL (ref 0.2–1.0)

## 2017-09-04 LAB — MICROSCOPIC EXAMINATION: EPITHELIAL CELLS (NON RENAL): NONE SEEN /HPF (ref 0–10)

## 2017-09-04 NOTE — Progress Notes (Signed)
Pt presents today with c/o gross hematuria, lower abd pain, night sweats, and chills. A clean catch was obtained for u/a and cx.  Blood pressure 109/76, pulse (!) 116, height 5\' 6"  (1.676 m), weight 127 lb (57.6 kg).

## 2017-09-08 ENCOUNTER — Telehealth: Payer: Self-pay

## 2017-09-08 LAB — CULTURE, URINE COMPREHENSIVE

## 2017-09-08 MED ORDER — SULFAMETHOXAZOLE-TRIMETHOPRIM 800-160 MG PO TABS: 1.0000 | ORAL_TABLET | Freq: Two times a day (BID) | ORAL | 0 refills | Status: AC

## 2017-09-08 NOTE — Telephone Encounter (Signed)
-----   Message from Hollice Espy, MD sent at 09/08/2017 12:51 PM EST ----- Roger Sparks did end up growing bacteria in  His urine.  This may be a contaminant but was treated like it is an infection as a precaution.  Bactrim DS twice daily times 7 days.  Hollice Espy, MD

## 2017-09-08 NOTE — Telephone Encounter (Signed)
Spoke with pt in reference to abx. Pt voiced understanding.  

## 2017-09-13 NOTE — Progress Notes (Signed)
[place]  Telephone:(336) [phone]  Fax:(336) [place] DOB: 1960-09-07  MR#: [id number]  JSE#:[id number]  Patient Care Team: Patient, No Pcp Per as PCP - General (General Practice) Clent Jacks, RN as Registered Nurse  CHIEF COMPLAINT:  Stage IV prostate cancer Gleason grade 8 with metastases to liver and bone.  INTERVAL HISTORY:  Patient returns to clinic for further evaluation and laboratory work.  Patient states that he is in constant pain, but at the same time the 30 mg MS Contin make him lethargic and sleepy.  He continues to have problems with constipation.  He continues to have hematuria.  He has no new neurologic complaints.  He denies any recent fevers or illnesses.  He has a fair appetite and denies weight loss.  He has no chest pain or shortness of breath.  He denies any nausea, vomiting, constipation, or diarrhea. He does admit to urinary frequency and occasional hematuria. Patient offers no further specific complaints.   REVIEW OF SYSTEMS:   Review of Systems  Constitutional: Positive for malaise/fatigue. Negative for fever and weight loss.  Respiratory: Negative.  Negative for cough and shortness of breath.   Cardiovascular: Negative.  Negative for chest pain and leg swelling.  Gastrointestinal: Negative.  Negative for abdominal pain.  Genitourinary: Positive for flank pain, frequency and hematuria.  Musculoskeletal: Positive for back pain and neck pain.  Skin: Negative.  Negative for rash.  Neurological: Positive for sensory change, focal weakness and weakness. Negative for headaches.  Psychiatric/Behavioral: Negative.  The patient is not nervous/anxious.     As per HPI. Otherwise, a complete review of systems is negative.  ONCOLOGY HISTORY: Oncology History   Carcinoma prostate adenocarcinoma in multiple fragments.  Gleason grade 8.  Involving neutral space.  Large duct and S cylinder-type.  Poorly differentiated.   Lateral neck involvement.Marland Kitchen PSAs low 2.4.  CT scan of abdomen was abnormal with multiple liver lesions.  Diagnosis in April of 2016 Clinical staging T1 cN0 M1 stage IV disease 2, CT-guided liver biopsies consistent with metastases from the prostate cancer   3.  Patient refused any chemotherapy or Lupron Started on ZYTIGA and prednisone from May of 2016     Prostate cancer metastatic to bone [facility])   12/09/2014 Initial Diagnosis    Cancer of prostate       PAST MEDICAL HISTORY: Past Medical History:  Diagnosis Date  . Cancer of prostate ([place]) 12/09/2014  . Cerebral seizure   . Neuromuscular disorder ([place])    brain tumor at 57 years of age  . Stroke ([place])    at 57 years of age    PAST SURGICAL HISTORY: No past surgical history on file.  FAMILY HISTORY: Reviewed and unchanged. No reported history of malignancy or chronic disease.  ADVANCED DIRECTIVES:    HEALTH MAINTENANCE: Social History   Tobacco Use  . Smoking status: Current Every Day Smoker    Packs/day: 0.50    Types: Cigarettes  . Smokeless tobacco: Never Used  Substance Use Topics  . Alcohol use: No  . Drug use: No      No Known Allergies  Current Outpatient Medications  Medication Sig Dispense Refill  . Oxycodone HCl 10 MG TABS Take 1 tablet (10 mg total) by mouth 3 (three) times daily as needed. 90 tablet 0  . abiraterone Acetate (ZYTIGA) 250 MG tablet Take 4 tablets (1,000 mg total) by mouth daily. (Patient not taking: Reported on 08/20/2017) 120 tablet  3  . gabapentin (NEURONTIN) 300 MG capsule Take 1 capsule (300 mg total) by mouth at bedtime. (Patient not taking: Reported on 03/25/2017) 30 capsule 2  . lactulose (CHRONULAC) 10 GM/15ML solution Take 15 mLs (10 g total) by mouth 2 (two) times daily as needed for mild constipation. 240 mL 0  . morphine (MS CONTIN) 15 MG 12 hr tablet Take 1 tablet (15 mg total) by mouth every 12 (twelve) hours. 60 tablet 0  . prochlorperazine (COMPAZINE) 10 MG tablet Take 1  tablet (10 mg total) by mouth every 6 (six) hours as needed for nausea or vomiting. 30 tablet 2  . senna-docusate (SENOKOT-S) 8.6-50 MG tablet Take by mouth.    . tamsulosin (FLOMAX) 0.4 MG CAPS capsule Take 1 capsule (0.4 mg total) by mouth daily. (Patient not taking: Reported on 04/07/2017) 7 capsule 0   No current facility-administered medications for this visit.     OBJECTIVE: BP 117/78 (BP Location: Left Arm, Patient Position: Sitting)   Pulse 65   Temp 97.8 F (36.6 C) (Tympanic)   Resp 20   Wt 124 lb 4.8 oz (56.4 kg)   BMI 20.06 kg/m    Body mass index is 20.06 kg/m.    ECOG FS:0 - Asymptomatic  General: Disheveled appearing, no acute distress.  Sitting in a wheelchair. Lungs: Clear to auscultation bilaterally. Heart: Regular rate and rhythm. No rubs, murmurs, or gallops. Abdomen: Soft, nontender, nondistended. No organomegaly noted, normoactive bowel sounds. Musculoskeletal: No edema, cyanosis, or clubbing. Mild palpation to tenderness on right flank. Neuro: Alert, answering all questions appropriately. Cranial nerves grossly intact. Skin: No rashes or petechiae noted. Psych: Normal affect.   LAB RESULTS:  CBC    Component Value Date/Time   WBC 12.2 (H) 09/17/2017 1451   RBC 3.87 (L) 09/17/2017 1451   HGB 9.9 (L) 09/17/2017 1451   HGB 15.1 11/08/2014 1009   HCT 30.8 (L) 09/17/2017 1451   HCT 44.5 11/08/2014 1009   PLT 668 (H) 09/17/2017 1451   PLT 250 11/08/2014 1009   MCV 79.5 (L) 09/17/2017 1451   MCV 92 11/08/2014 1009   MCH 25.5 (L) 09/17/2017 1451   MCHC 32.1 09/17/2017 1451   RDW 17.1 (H) 09/17/2017 1451   RDW 13.2 11/08/2014 1009   LYMPHSABS 1.0 09/17/2017 1451   LYMPHSABS 2.0 11/08/2014 1009   MONOABS 1.1 (H) 09/17/2017 1451   MONOABS 0.8 11/08/2014 1009   EOSABS 0.0 09/17/2017 1451   EOSABS 0.1 11/08/2014 1009   BASOSABS 0.1 09/17/2017 1451   BASOSABS 0.1 11/08/2014 1009   BMET    Component Value Date/Time   NA 129 (L) 09/17/2017 1451   NA  134 (L) 11/08/2014 1009   K 4.0 09/17/2017 1451   K 3.7 11/08/2014 1009   CL 94 (L) 09/17/2017 1451   CL 105 11/08/2014 1009   CO2 24 09/17/2017 1451   CO2 22 11/08/2014 1009   GLUCOSE 143 (H) 09/17/2017 1451   GLUCOSE 111 (H) 11/08/2014 1009   BUN 16 09/17/2017 1451   BUN 12 11/08/2014 1009   CREATININE 0.58 (L) 09/17/2017 1451   CREATININE 0.59 (L) 11/08/2014 1009   CALCIUM 9.2 09/17/2017 1451   CALCIUM 9.2 11/08/2014 1009   GFRNONAA >60 09/17/2017 1451   GFRNONAA >60 11/08/2014 1009   GFRAA >60 09/17/2017 1451   GFRAA >60 11/08/2014 1009     STUDIES: No results found.  ASSESSMENT:  Stage IV prostate cancer Gleason grade 8 with metastases to liver and bone.  PLAN:  1. Stage IV prostate cancer Gleason grade 8 with metastases to liver and bone: Patient's CT scans from August 13, 2017 reviewed independently revealing significant progression of disease.  His most recent PSA completed [place] on July 31, 2017 was 38.5, today's result is pending.  Patient continues to refuse outpatient palliative care or hospice stating he does not want help or people coming into his home.  He continues to refuse Zometa or Xgeva for his bony disease.  Patient states he has Zytiga at home and plans to reinitiate treatment.  If patient is compliant and his PSA trends down, will consider continuation of treatment in 1 month.  Return to clinic in 4 weeks for further evaluation. 2. Bony lesions: Patient is refusing Xgeva or Zometa. 3. Hypokalemia: Patient's potassium is within normal limits today.   4. Urinary frequency/hematuria: Likely secondary to enlarged prostate and invasive malignancy.  5.  Pain: Patient states 30 mg MS Contin was too strong, therefore he was given a prescription for 15 mg MS Contin every 12 hours.  He was also given his refill of oxycodone 10 mg 3 times a day as needed. 6.  Constipation: Patient states over-the-counter regimens do not work therefore he is given a  prescription for lactulose. 7.  Nausea: Patient was given a prescription for Compazine today.  Approximately 30 minutes was spent in discussion of which greater than 50% was consultation.  Patient expressed understanding and was in agreement with this plan. He also understands that He can call clinic at any time with any questions, concerns, or complaints.    Cancer of prostate [facility])   Staging form: Prostate, AJCC 7th Edition     Clinical: Stage IV (T1c, N0, M1, PSA: Less than 10, Gleason 8-10 - Poorly differentiated/undifferentiated (marked anaplasia)) - Signed by Forest Gleason, MD on 12/09/2014   [name], MD   09/17/2017 5:17 PM

## 2017-09-17 ENCOUNTER — Inpatient Hospital Stay: Payer: Medicare Other | Attending: Oncology

## 2017-09-17 ENCOUNTER — Inpatient Hospital Stay (HOSPITAL_BASED_OUTPATIENT_CLINIC_OR_DEPARTMENT_OTHER): Payer: Medicare Other | Admitting: Oncology

## 2017-09-17 ENCOUNTER — Other Ambulatory Visit: Payer: Self-pay

## 2017-09-17 VITALS — BP 117/78 | HR 65 | Temp 97.8°F | Resp 20 | Wt 124.3 lb

## 2017-09-17 DIAGNOSIS — M899 Disorder of bone, unspecified: Secondary | ICD-10-CM | POA: Diagnosis not present

## 2017-09-17 DIAGNOSIS — C787 Secondary malignant neoplasm of liver and intrahepatic bile duct: Secondary | ICD-10-CM | POA: Diagnosis not present

## 2017-09-17 DIAGNOSIS — C7951 Secondary malignant neoplasm of bone: Secondary | ICD-10-CM

## 2017-09-17 DIAGNOSIS — R350 Frequency of micturition: Secondary | ICD-10-CM | POA: Diagnosis not present

## 2017-09-17 DIAGNOSIS — R110 Nausea: Secondary | ICD-10-CM | POA: Diagnosis not present

## 2017-09-17 DIAGNOSIS — K5900 Constipation, unspecified: Secondary | ICD-10-CM | POA: Insufficient documentation

## 2017-09-17 DIAGNOSIS — R52 Pain, unspecified: Secondary | ICD-10-CM | POA: Insufficient documentation

## 2017-09-17 DIAGNOSIS — C61 Malignant neoplasm of prostate: Secondary | ICD-10-CM | POA: Diagnosis not present

## 2017-09-17 DIAGNOSIS — R319 Hematuria, unspecified: Secondary | ICD-10-CM | POA: Diagnosis not present

## 2017-09-17 LAB — COMPREHENSIVE METABOLIC PANEL
ALT: 23 U/L (ref 17–63)
ANION GAP: 11 (ref 5–15)
AST: 30 U/L (ref 15–41)
Albumin: 3.1 g/dL — ABNORMAL LOW (ref 3.5–5.0)
Alkaline Phosphatase: 335 U/L — ABNORMAL HIGH (ref 38–126)
BUN: 16 mg/dL (ref 6–20)
CHLORIDE: 94 mmol/L — AB (ref 101–111)
CO2: 24 mmol/L (ref 22–32)
CREATININE: 0.58 mg/dL — AB (ref 0.61–1.24)
Calcium: 9.2 mg/dL (ref 8.9–10.3)
Glucose, Bld: 143 mg/dL — ABNORMAL HIGH (ref 65–99)
Potassium: 4 mmol/L (ref 3.5–5.1)
Sodium: 129 mmol/L — ABNORMAL LOW (ref 135–145)
Total Bilirubin: 0.3 mg/dL (ref 0.3–1.2)
Total Protein: 8.5 g/dL — ABNORMAL HIGH (ref 6.5–8.1)

## 2017-09-17 LAB — CBC WITH DIFFERENTIAL/PLATELET
Basophils Absolute: 0.1 10*3/uL (ref 0–0.1)
Basophils Relative: 1 %
EOS PCT: 0 %
Eosinophils Absolute: 0 10*3/uL (ref 0–0.7)
HCT: 30.8 % — ABNORMAL LOW (ref 40.0–52.0)
Hemoglobin: 9.9 g/dL — ABNORMAL LOW (ref 13.0–18.0)
LYMPHS ABS: 1 10*3/uL (ref 1.0–3.6)
Lymphocytes Relative: 8 %
MCH: 25.5 pg — AB (ref 26.0–34.0)
MCHC: 32.1 g/dL (ref 32.0–36.0)
MCV: 79.5 fL — ABNORMAL LOW (ref 80.0–100.0)
MONO ABS: 1.1 10*3/uL — AB (ref 0.2–1.0)
MONOS PCT: 9 %
Neutro Abs: 10 10*3/uL — ABNORMAL HIGH (ref 1.4–6.5)
Neutrophils Relative %: 82 %
PLATELETS: 668 10*3/uL — AB (ref 150–440)
RBC: 3.87 MIL/uL — ABNORMAL LOW (ref 4.40–5.90)
RDW: 17.1 % — AB (ref 11.5–14.5)
WBC: 12.2 10*3/uL — ABNORMAL HIGH (ref 3.8–10.6)

## 2017-09-17 LAB — PSA: Prostatic Specific Antigen: 134.97 ng/mL — ABNORMAL HIGH (ref 0.00–4.00)

## 2017-09-17 MED ORDER — PROCHLORPERAZINE MALEATE 10 MG PO TABS: 10.0000 mg | ORAL_TABLET | Freq: Four times a day (QID) | ORAL | 2 refills | Status: AC | PRN

## 2017-09-17 MED ORDER — MORPHINE SULFATE ER 15 MG PO TBCR: 15.0000 mg | EXTENDED_RELEASE_TABLET | Freq: Two times a day (BID) | ORAL | 0 refills | Status: DC

## 2017-09-17 MED ORDER — LACTULOSE 10 GM/15ML PO SOLN: 10.0000 g | Freq: Two times a day (BID) | ORAL | 0 refills | Status: AC | PRN

## 2017-09-17 MED ORDER — OXYCODONE HCL 10 MG PO TABS: 10.0000 mg | ORAL_TABLET | Freq: Three times a day (TID) | ORAL | 0 refills | Status: DC | PRN

## 2017-09-17 NOTE — Progress Notes (Signed)
Pt here for follow up. On going back pain, ms contin " puts me to sleep " -no BM for 5 days . Has tried mag citrare and sennokot,and exlax-w no help.  Per pt and son blood in urine continues daily. No pain on urination.stated frequency- q hr to urinate. Dr Grayland Ormond informed.

## 2017-09-18 ENCOUNTER — Other Ambulatory Visit: Payer: Self-pay | Admitting: *Deleted

## 2017-09-30 ENCOUNTER — Telehealth: Payer: Self-pay | Admitting: *Deleted

## 2017-09-30 NOTE — Telephone Encounter (Addendum)
Wife called stating patient needs refill of his pain medicine. I asked how many he had left and she said 7 tabs. He was given prescription for #90 on 2/21 and per pharmacy he picked up #90 tabs on 2/22 after they got approval to fill this prescription early. Wife states no one has access to his medicine but her and him and that he is only taking ione three times a day. He is using more than  double what is prescribed. Please advise

## 2017-09-30 NOTE — Telephone Encounter (Signed)
Is he taking the MS contin prescribed?

## 2017-09-30 NOTE — Telephone Encounter (Signed)
I spoke with wife and she states he is only taking his MS 1 at bedtime. I told her that he needs to take it as prescribed every 12 hours and that if he takes it as directed he should not need as much Oxycodone and that Dr Grayland Ormond does not want to refill Oxy if he is not taking it as prescribed. I again reiterated that he is missing more than half of his Oxycodone and questioned her again about someone else having access to his medicine sand she states no one else has access to it. I told her that I spoke with pharmacy who reports that he was given # 90 tabs on 2/22 and this should last him until 3/24 if he takes as directed. She again states he told her he is only taking 1 pill and only 3 times a day. I told her to call back when he is completely out of medicine and we would see about refill oxy for only a 2 week supply since he keeps coming up short on his medications.

## 2017-10-07 ENCOUNTER — Encounter: Payer: Self-pay | Admitting: Emergency Medicine

## 2017-10-07 ENCOUNTER — Telehealth: Payer: Self-pay | Admitting: *Deleted

## 2017-10-07 ENCOUNTER — Emergency Department
Admission: EM | Admit: 2017-10-07 | Discharge: 2017-10-08 | Disposition: A | Discharge status: Home / Self Care | Payer: Medicare Other | Attending: Emergency Medicine | Admitting: Emergency Medicine

## 2017-10-07 DIAGNOSIS — Z79899 Other long term (current) drug therapy: Secondary | ICD-10-CM | POA: Insufficient documentation

## 2017-10-07 DIAGNOSIS — F17210 Nicotine dependence, cigarettes, uncomplicated: Secondary | ICD-10-CM | POA: Diagnosis not present

## 2017-10-07 DIAGNOSIS — C7951 Secondary malignant neoplasm of bone: Secondary | ICD-10-CM | POA: Diagnosis not present

## 2017-10-07 DIAGNOSIS — Z8546 Personal history of malignant neoplasm of prostate: Secondary | ICD-10-CM | POA: Insufficient documentation

## 2017-10-07 DIAGNOSIS — R1030 Lower abdominal pain, unspecified: Secondary | ICD-10-CM | POA: Insufficient documentation

## 2017-10-07 DIAGNOSIS — C719 Malignant neoplasm of brain, unspecified: Secondary | ICD-10-CM | POA: Insufficient documentation

## 2017-10-07 DIAGNOSIS — R39198 Other difficulties with micturition: Secondary | ICD-10-CM | POA: Diagnosis not present

## 2017-10-07 DIAGNOSIS — R319 Hematuria, unspecified: Secondary | ICD-10-CM | POA: Diagnosis not present

## 2017-10-07 DIAGNOSIS — R58 Hemorrhage, not elsewhere classified: Secondary | ICD-10-CM | POA: Diagnosis not present

## 2017-10-07 LAB — URINALYSIS, COMPLETE (UACMP) WITH MICROSCOPIC
BACTERIA UA: NONE SEEN
SQUAMOUS EPITHELIAL / LPF: NONE SEEN
Specific Gravity, Urine: 1.034 — ABNORMAL HIGH (ref 1.005–1.030)

## 2017-10-07 LAB — BASIC METABOLIC PANEL
ANION GAP: 16 — AB (ref 5–15)
BUN: 11 mg/dL (ref 6–20)
CALCIUM: 8.9 mg/dL (ref 8.9–10.3)
CO2: 22 mmol/L (ref 22–32)
Chloride: 98 mmol/L — ABNORMAL LOW (ref 101–111)
Creatinine, Ser: 0.55 mg/dL — ABNORMAL LOW (ref 0.61–1.24)
GFR calc non Af Amer: >60 mL/min (ref >60)
GLUCOSE: 110 mg/dL — AB (ref 65–99)
POTASSIUM: 3.6 mmol/L (ref 3.5–5.1)
SODIUM: 136 mmol/L (ref 135–145)

## 2017-10-07 LAB — CBC
HEMATOCRIT: 32.3 % — AB (ref 40.0–52.0)
HEMOGLOBIN: 10.3 g/dL — AB (ref 13.0–18.0)
MCH: 24.2 pg — ABNORMAL LOW (ref 26.0–34.0)
MCHC: 31.9 g/dL — ABNORMAL LOW (ref 32.0–36.0)
MCV: 76 fL — ABNORMAL LOW (ref 80.0–100.0)
Platelets: 859 10*3/uL — ABNORMAL HIGH (ref 150–440)
RBC: 4.25 MIL/uL — AB (ref 4.40–5.90)
RDW: 17.6 % — ABNORMAL HIGH (ref 11.5–14.5)
WBC: 14.3 10*3/uL — AB (ref 3.8–10.6)

## 2017-10-07 MED ORDER — MORPHINE SULFATE (PF) 4 MG/ML IV SOLN
INTRAVENOUS | Status: AC
  Administered 2017-10-07: 23:00:00
  Filled 2017-10-07: qty 1

## 2017-10-07 MED ORDER — LIDOCAINE HCL 2 % EX GEL
CUTANEOUS | Status: AC
Start: 2017-10-07 — End: 2017-10-07
  Administered 2017-10-07: 22:00:00
  Filled 2017-10-07: qty 10

## 2017-10-07 NOTE — ED Notes (Signed)
FIRST NURSE NOTE: pt comes into the ED via EMS from home with c/o bleeding from his penis. Pt has a hx of stroke and prostate cancer.Marland Kitchen

## 2017-10-07 NOTE — Telephone Encounter (Signed)
Patient requesting his Oxycodone early again, I explained to wife that this is a habit and that we will not fill it early as discussed with last refill She asked what he was to do for pain and I told her he needs to take his MS ER as ordered. She stated he is only taking it once a day. I told her he needs to take it twice a day as ordered. I told her the Oxycodone will not be due for another week

## 2017-10-07 NOTE — ED Notes (Signed)
Pt states that he has "been bleeding out his penis since 5:00pm". Wife at bedside.

## 2017-10-07 NOTE — ED Notes (Signed)
Pt had 53ml of urine per bladder scan

## 2017-10-07 NOTE — ED Triage Notes (Signed)
Pt comes into the ED via EMS from home c/o urinating blood.  Patient states this started around 5:00 today and patient has h/o prostate cancer.  Patient denies any blood thinner use.  Patient in NAD at this time.  H/o stroke.

## 2017-10-07 NOTE — ED Provider Notes (Addendum)
[facility] Provider Note  ____________________________________________   First MD Initiated Contact with Patient 10/07/17 2101     (approximate)  I have reviewed the triage vital signs and the nursing notes.   HISTORY  Chief Complaint Hematuria   HPI Roger Sparks. is a 57 y.o. male with a history of metastatic prostate cancer who is presenting to the emergency department with hematuria that worsened today at 5 PM.  He says he is now only able to pass a small amount of urine in his lower abdominal pain.  He says that he has had ongoing hematuria over the past 1-2 months and has required a catheter before for enlarged prostate.  He does not report any chills or fever.    Past Medical History:  Diagnosis Date  . Cancer of prostate ([name]) 12/09/2014  . Cerebral seizure   . Neuromuscular disorder ([name])    brain tumor at 58 years of age  . Stroke [name])    at 57 years of age    Patient Active Problem List   Diagnosis Date Noted  . Prostate cancer metastatic to bone ([name]) 12/09/2014  . Brain tumor, glioma ([name]) 07/16/2014  . Right arm weakness 07/16/2014  . Cerebral seizure 07/16/2014  . Other symptoms and signs involving the musculoskeletal system 07/16/2014  . Tear film insufficiency 11/10/2011  . Trichiasis of eyelid 11/10/2011    History reviewed. No pertinent surgical history.  Prior to Admission medications   Medication Sig Start Date End Date Taking? Authorizing Provider  abiraterone Acetate (ZYTIGA) 250 MG tablet Take 4 tablets (1,000 mg total) by mouth daily. Patient not taking: Reported on 08/20/2017 04/09/16   [name], MD  gabapentin (NEURONTIN) 300 MG capsule Take 1 capsule (300 mg total) by mouth at bedtime. Patient not taking: Reported on 03/25/2017 03/03/17   [name], MD  lactulose (CHRONULAC) 10 GM/15ML solution Take 15 mLs (10 g total) by mouth 2 (two) times daily as needed for mild  constipation. 09/17/17   [name], MD  morphine (MS CONTIN) 15 MG 12 hr tablet Take 1 tablet (15 mg total) by mouth every 12 (twelve) hours. 09/17/17   [name], MD  Oxycodone HCl 10 MG TABS Take 1 tablet (10 mg total) by mouth 3 (three) times daily as needed. 09/17/17   [name], MD  prochlorperazine (COMPAZINE) 10 MG tablet Take 1 tablet (10 mg total) by mouth every 6 (six) hours as needed for nausea or vomiting. 09/17/17   [name], MD  senna-docusate (SENOKOT-S) 8.6-50 MG tablet Take by mouth. 06/02/17   [provider]  tamsulosin (FLOMAX) 0.4 MG CAPS capsule Take 1 capsule (0.4 mg total) by mouth daily. Patient not taking: Reported on 04/07/2017 02/08/17   [name], MD    Allergies Patient has no known allergies.  No family history on file.  Social History Social History   Tobacco Use  . Smoking status: Current Every Day Smoker    Packs/day: 0.50    Types: Cigarettes  . Smokeless tobacco: Never Used  Substance Use Topics  . Alcohol use: No  . Drug use: No    Review of Systems  Constitutional: No fever/chills Eyes: No visual changes. ENT: No sore throat. Cardiovascular: Denies chest pain. Respiratory: Denies shortness of breath. Gastrointestinal: No nausea, no vomiting.  No diarrhea.  No constipation. Genitourinary: As above Musculoskeletal: Negative for back pain. Skin: Negative for rash. Neurological: Negative for headaches,  focal weakness or numbness.   ____________________________________________   PHYSICAL EXAM:  VITAL SIGNS: ED Triage Vitals  Enc Vitals Group     BP 10/07/17 1849 113/74     Pulse Rate 10/07/17 1849 (!) 119     Resp 10/07/17 1849 18     Temp 10/07/17 1849 97.9 F (36.6 C)     Temp Source 10/07/17 1849 Oral     SpO2 10/07/17 1849 100 %     Weight 10/07/17 1848 118 lb (53.5 kg)     Height 10/07/17 1848 5\' 6"  (1.676 m)     Head Circumference --      Peak Flow --      Pain Score  10/07/17 1857 9     Pain Loc --      Pain Edu? --      Excl. in [place]? --     Constitutional: Alert and oriented. Well appearing and in no acute distress. Eyes: Conjunctivae are normal.  Head: Atraumatic. Nose: No congestion/rhinnorhea. Mouth/Throat: Mucous membranes are moist.  Neck: No stridor.   Cardiovascular: Normal rate, regular rhythm. Grossly normal heart sounds.  Respiratory: Normal respiratory effort.  No retractions. Lungs CTAB. Gastrointestinal: Soft with mild diffuse tenderness which is at its worst over the suprapubic region. No distention. No CVA tenderness. Musculoskeletal: No lower extremity tenderness nor edema.  No joint effusions. Neurologic:  Normal speech and language. No gross focal neurologic deficits are appreciated. Skin:  Skin is warm, dry and intact. No rash noted. Psychiatric: Mood and affect are normal. Speech and behavior are normal.  ____________________________________________   LABS (all labs ordered are listed, but only abnormal results are displayed)  Labs Reviewed  URINALYSIS, COMPLETE (UACMP) WITH MICROSCOPIC - Abnormal; Notable for the following components:      Result Value   Color, Urine RED (*)    APPearance CLOUDY (*)    Specific Gravity, Urine 1.034 (*)    Glucose, UA   (*)    Value: TEST NOT REPORTED DUE TO COLOR INTERFERENCE OF URINE PIGMENT   Hgb urine dipstick   (*)    Value: TEST NOT REPORTED DUE TO COLOR INTERFERENCE OF URINE PIGMENT   Bilirubin Urine   (*)    Value: TEST NOT REPORTED DUE TO COLOR INTERFERENCE OF URINE PIGMENT   Ketones, ur   (*)    Value: TEST NOT REPORTED DUE TO COLOR INTERFERENCE OF URINE PIGMENT   Protein, ur   (*)    Value: TEST NOT REPORTED DUE TO COLOR INTERFERENCE OF URINE PIGMENT   Nitrite   (*)    Value: TEST NOT REPORTED DUE TO COLOR INTERFERENCE OF URINE PIGMENT   Leukocytes, UA   (*)    Value: TEST NOT REPORTED DUE TO COLOR INTERFERENCE OF URINE PIGMENT   All other components within normal limits   BASIC METABOLIC PANEL - Abnormal; Notable for the following components:   Chloride 98 (*)    Glucose, Bld 110 (*)    Creatinine, Ser 0.55 (*)    Anion gap 16 (*)    All other components within normal limits  CBC - Abnormal; Notable for the following components:   WBC 14.3 (*)    RBC 4.25 (*)    Hemoglobin 10.3 (*)    HCT 32.3 (*)    MCV 76.0 (*)    MCH 24.2 (*)    MCHC 31.9 (*)    RDW 17.6 (*)    Platelets 859 (*)    All other components within  normal limits   ____________________________________________  EKG   ____________________________________________  RADIOLOGY   ____________________________________________   PROCEDURES  Procedure(s) performed:   Procedures  Critical Care performed:   ____________________________________________   INITIAL IMPRESSION / ASSESSMENT AND PLAN / ED COURSE  Pertinent labs & imaging results that were available during my care of the patient were reviewed by me and considered in my medical decision making (see chart for details).  Differential diagnosis includes, but is not limited to, acute appendicitis, renal colic, testicular torsion, urinary tract infection/pyelonephritis, prostatitis,  epididymitis, diverticulitis, small bowel obstruction or ileus, colitis, abdominal aortic aneurysm, gastroenteritis, hernia, etc. As part of my medical decision making, I reviewed the following data within the electronic MEDICAL RECORD NUMBER Notes from prior ED visits Patient with decreased amount of urine being able to be passed as well as hematuria.  Decision made to insert Foley so patient can have free passage of urine from the bladder.  Feel there is likely an obstruction.   ----------------------------------------- 11:59 PM on 10/07/2017 -----------------------------------------  Foley placed but only a small amount of red urine returned.  Patient says he is now pain-free after morphine.  Nurse to flush the Foley to clear the urine.  Likely to  be discharged home with Foley catheter and follow-up with urology.  Signed out to Dr. Owens Shark.  Will cover with antibiotics because of new insertion of Foley and elevated white blood cell count. ____________________________________________   FINAL CLINICAL IMPRESSION(S) / ED DIAGNOSES  Hematuria.      NEW MEDICATIONS STARTED DURING THIS VISIT:  New Prescriptions   No medications on file     Note:  This document was prepared using Dragon voice recognition software and may include unintentional dictation errors.     Orbie Pyo, MD 10/07/17 2359    Orbie Pyo, MD 10/08/17 [phone]

## 2017-10-07 NOTE — Telephone Encounter (Signed)
Agreed.  Thank you

## 2017-10-08 MED ORDER — OXYCODONE-ACETAMINOPHEN 5-325 MG PO TABS
1.0000 | ORAL_TABLET | Freq: Once | ORAL | Status: AC
  Administered 2017-10-08: 1 via ORAL
  Filled 2017-10-08: qty 1

## 2017-10-08 MED ORDER — CEPHALEXIN 500 MG PO CAPS: 500.0000 mg | ORAL_CAPSULE | Freq: Two times a day (BID) | ORAL | 0 refills | Status: AC

## 2017-10-08 MED ORDER — TRAMADOL HCL 50 MG PO TABS
50.0000 mg | ORAL_TABLET | Freq: Once | ORAL | Status: DC
Start: 2017-10-08 — End: 2017-10-08

## 2017-10-08 MED ORDER — OXYCODONE-ACETAMINOPHEN 5-325 MG PO TABS: 1.0000 | ORAL_TABLET | ORAL | 0 refills | Status: DC | PRN

## 2017-10-08 NOTE — ED Notes (Signed)
Kendall RN attempted to flush and pull off any clots present. She reported there were no clots found

## 2017-10-08 NOTE — ED Notes (Signed)
Placed leg bag on pt.

## 2017-10-08 NOTE — ED Notes (Signed)
This RN and Matt flushed pt's Foley and attempted to collect any clots present. We did not find that there were any clots present.

## 2017-10-09 ENCOUNTER — Ambulatory Visit (INDEPENDENT_AMBULATORY_CARE_PROVIDER_SITE_OTHER): Payer: Medicare Other

## 2017-10-09 VITALS — BP 124/77 | HR 123 | Ht 67.0 in | Wt 124.0 lb

## 2017-10-09 DIAGNOSIS — R310 Gross hematuria: Secondary | ICD-10-CM

## 2017-10-09 NOTE — Progress Notes (Signed)
Pt presents today with c/o gross hematuria, dysuria, and leakage around foley.  Per Dr. Bernardo Heater foley was removed and replaced. Pt stated "That feels so much better!"  Blood pressure 124/77, pulse (!) 123, height 5\' 7"  (1.702 m), weight 124 lb (56.2 kg).

## 2017-10-11 NOTE — Progress Notes (Signed)
[facility]  [phone]  Fax:([phone]) [place] DOB: 17-Jan-1961  MR#: [id number]  MWU#:[id number]  Roger Sparks Care Team: Roger Sparks, No Pcp Per as PCP - General (General Practice) Clent Jacks, RN as Registered Nurse  CHIEF COMPLAINT:  Stage IV prostate cancer Gleason grade 8 with metastases to liver and bone.  INTERVAL HISTORY:  Roger Sparks returns to clinic for further evaluation and laboratory work.  He continues to have persistent pain, but is not taking his narcotic prescription as prescribed.  He continues to have problems with constipation.  He now has a urinary bladder catheter that Roger Sparks's wife reports frequently leaks.  He continues to have hematuria.  He has no new neurologic complaints.  He denies any recent fevers or illnesses. He has a fair appetite and denies weight loss.  He has no chest pain or shortness of breath.  He denies any nausea, vomiting, or diarrhea.  Roger Sparks offers no further specific complaints.  REVIEW OF SYSTEMS:   Review of Systems  Constitutional: Positive for malaise/fatigue. Negative for fever and weight loss.  Respiratory: Negative.  Negative for cough and shortness of breath.   Cardiovascular: Negative.  Negative for chest pain and leg swelling.  Gastrointestinal: Negative.  Negative for abdominal pain.  Genitourinary: Positive for flank pain, frequency and hematuria.  Musculoskeletal: Positive for back pain and neck pain.  Skin: Negative.  Negative for rash.  Neurological: Positive for sensory change, focal weakness and weakness. Negative for headaches.  Psychiatric/Behavioral: Negative.  The Roger Sparks is not nervous/anxious.     As per HPI. Otherwise, a complete review of systems is negative.  ONCOLOGY HISTORY: Oncology History   Carcinoma prostate adenocarcinoma in multiple fragments.  Gleason grade 8.  Involving neutral space.  Large duct and S cylinder-type.  Poorly differentiated.  Lateral neck  involvement.Marland Kitchen PSAs low 2.4.  CT scan of abdomen was abnormal with multiple liver lesions.  Diagnosis in April of 2016 Clinical staging T1 cN0 M1 stage IV disease 2, CT-guided liver biopsies consistent with metastases from the prostate cancer   3.  Roger Sparks refused any chemotherapy or Lupron Started on ZYTIGA and prednisone from May of 2016     Prostate cancer metastatic to bone [facility])   12/09/2014 Initial Diagnosis    Cancer of prostate       PAST MEDICAL HISTORY: Past Medical History:  Diagnosis Date  . Cancer of prostate ([place]) 12/09/2014  . Cerebral seizure   . Neuromuscular disorder ([place])    brain tumor at 57 years of age  . Stroke ([place])    at 57 years of age    PAST SURGICAL HISTORY: History reviewed. No pertinent surgical history.  FAMILY HISTORY: Reviewed and unchanged. No reported history of malignancy or chronic disease.  ADVANCED DIRECTIVES:    HEALTH MAINTENANCE: Social History   Tobacco Use  . Smoking status: Current Every Day Smoker    Packs/day: 0.50    Types: Cigarettes  . Smokeless tobacco: Never Used  Substance Use Topics  . Alcohol use: No  . Drug use: No      No Known Allergies  Current Outpatient Medications  Medication Sig Dispense Refill  . cephALEXin (KEFLEX) 500 MG capsule Take 1 capsule (500 mg total) by mouth 2 (two) times daily for 10 days. 10 capsule 0  . lactulose (CHRONULAC) 10 GM/15ML solution Take 15 mLs (10 g total) by mouth 2 (two) times daily as needed for mild constipation. 240 mL 0  .  Oxycodone HCl 10 MG TABS Take 1 tablet (10 mg total) by mouth every 6 (six) hours as needed. 60 tablet 0  . prochlorperazine (COMPAZINE) 10 MG tablet Take 1 tablet (10 mg total) by mouth every 6 (six) hours as needed for nausea or vomiting. 30 tablet 2  . senna-docusate (SENOKOT-S) 8.6-50 MG tablet Take by mouth.    . tamsulosin (FLOMAX) 0.4 MG CAPS capsule Take 1 capsule (0.4 mg total) by mouth daily. 7 capsule 0  . abiraterone Acetate (ZYTIGA)  250 MG tablet Take 4 tablets (1,000 mg total) by mouth daily. (Roger Sparks not taking: Reported on 08/20/2017) 120 tablet 3  . gabapentin (NEURONTIN) 300 MG capsule Take 1 capsule (300 mg total) by mouth at bedtime. (Roger Sparks not taking: Reported on 03/25/2017) 30 capsule 2   No current facility-administered medications for this visit.     OBJECTIVE: BP 108/75 (BP Location: Left Arm, Roger Sparks Position: Sitting)   Pulse (!) 106   Temp 98.3 F (36.8 C) (Tympanic)   Resp 20   Wt 121 lb (54.9 kg)   BMI 18.95 kg/m    Body mass index is 18.95 kg/m.    ECOG FS:0 - Asymptomatic  General: Disheveled appearing, no acute distress.  Sitting in a wheelchair. Lungs: Clear to auscultation bilaterally. Heart: Regular rate and rhythm. No rubs, murmurs, or gallops. Abdomen: Soft, nontender, nondistended. No organomegaly noted, normoactive bowel sounds. Musculoskeletal: No edema, cyanosis, or clubbing. Mild palpation to tenderness on right flank. Neuro: Alert, answering all questions appropriately. Cranial nerves grossly intact. Skin: No rashes or petechiae noted. Psych: Normal affect.   LAB RESULTS:  CBC    Component Value Date/Time   WBC 12.6 (H) 10/13/2017 1118   RBC 3.16 (L) 10/13/2017 1118   HGB 7.6 (L) 10/13/2017 1118   HGB 15.1 11/08/2014 1009   HCT 23.6 (L) 10/13/2017 1118   HCT 44.5 11/08/2014 1009   PLT 729 (H) 10/13/2017 1118   PLT 250 11/08/2014 1009   MCV 74.6 (L) 10/13/2017 1118   MCV 92 11/08/2014 1009   MCH 24.0 (L) 10/13/2017 1118   MCHC 32.1 10/13/2017 1118   RDW 18.1 (H) 10/13/2017 1118   RDW 13.2 11/08/2014 1009   LYMPHSABS 1.0 10/13/2017 1118   LYMPHSABS 2.0 11/08/2014 1009   MONOABS 0.9 10/13/2017 1118   MONOABS 0.8 11/08/2014 1009   EOSABS 0.0 10/13/2017 1118   EOSABS 0.1 11/08/2014 1009   BASOSABS 0.1 10/13/2017 1118   BASOSABS 0.1 11/08/2014 1009   BMET    Component Value Date/Time   NA 133 (L) 10/13/2017 1118   NA 134 (L) 11/08/2014 1009   K 3.2 (L)  10/13/2017 1118   K 3.7 11/08/2014 1009   CL 97 (L) 10/13/2017 1118   CL 105 11/08/2014 1009   CO2 22 10/13/2017 1118   CO2 22 11/08/2014 1009   GLUCOSE 112 (H) 10/13/2017 1118   GLUCOSE 111 (H) 11/08/2014 1009   BUN 10 10/13/2017 1118   BUN 12 11/08/2014 1009   CREATININE 0.46 (L) 10/13/2017 1118   CREATININE 0.59 (L) 11/08/2014 1009   CALCIUM 8.5 (L) 10/13/2017 1118   CALCIUM 9.2 11/08/2014 1009   GFRNONAA >60 10/13/2017 1118   GFRNONAA >60 11/08/2014 1009   GFRAA >60 10/13/2017 1118   GFRAA >60 11/08/2014 1009     STUDIES: No results found.  ASSESSMENT:  Stage IV prostate cancer Gleason grade 8 with metastases to liver and bone.  PLAN:    1. Stage IV prostate cancer Gleason grade 8  with metastases to liver and bone: Roger Sparks's CT scans from August 13, 2017 reviewed independently revealing significant progression of disease.  Roger Sparks's PSA continues to trend up significantly, now approximately 135.  Today's result is pending.  He continues to refuse Zometa or Xgeva for his bony disease.  Roger Sparks states he has Zytiga at home and plans to start taking it.  He finally has agreed to hospice and a referral has been sent.  Return to clinic in 1 month for further evaluation.  If Roger Sparks is enrolled in hospice, this appointment may be canceled.  2. Bony lesions: Roger Sparks is refusing Xgeva or Zometa. 3. Hypokalemia: Roger Sparks's potassium slightly low today.  He is noncompliant with his potassium supplementation. 4. Urinary frequency/hematuria: Likely secondary to enlarged prostate and invasive malignancy.  Roger Sparks now has a urinary bladder catheter in place.  Continue management and follow-up with urology. 5.  Pain: Roger Sparks states MS Contin was too strong and does not take it.  He was given a 2 week supply (60 tabs) of his oxycodone 10 mg 4 times a day.  Roger Sparks also signed a narcotic contract today. 6.  Constipation: Continue lactulose as needed. 7.  Nausea: Continue Compazine as  needed.  Approximately 30 minutes was spent in discussion of which greater than 50% was consultation.  Roger Sparks expressed understanding and was in agreement with this plan. He also understands that He can call clinic at any time with any questions, concerns, or complaints.    Cancer of prostate [facility])   Staging form: Prostate, AJCC 7th Edition     Clinical: Stage IV (T1c, N0, M1, PSA: Less than 10, Gleason 8-10 - Poorly differentiated/undifferentiated (marked anaplasia)) - Signed by Forest Gleason, MD on 12/09/2014   [name], MD   10/13/2017 1:31 PM

## 2017-10-12 ENCOUNTER — Telehealth: Payer: Self-pay | Admitting: Urology

## 2017-10-12 NOTE — Telephone Encounter (Signed)
Pt wife called office stating that pt has catheter and urine is leaking around the catheter, when pt urinates, it gets all over him.  Wife states this has gotten worse since hsi nurse visit last week, was told that it was normal, pt wife feels this is Not normal. Pt wife asking why is this happening and is this going to go away, wife, Clarise Cruz would like for someone to call her and advise. Thanks.

## 2017-10-12 NOTE — Telephone Encounter (Signed)
Pt wife calling again .Marland Kitchen [place] talked to wife.

## 2017-10-13 ENCOUNTER — Other Ambulatory Visit: Payer: Self-pay | Admitting: Oncology

## 2017-10-13 ENCOUNTER — Encounter: Payer: Self-pay | Admitting: Urology

## 2017-10-13 ENCOUNTER — Inpatient Hospital Stay: Payer: Medicare Other

## 2017-10-13 ENCOUNTER — Encounter: Payer: Self-pay | Admitting: Oncology

## 2017-10-13 ENCOUNTER — Ambulatory Visit (INDEPENDENT_AMBULATORY_CARE_PROVIDER_SITE_OTHER): Payer: Medicare Other | Admitting: Urology

## 2017-10-13 ENCOUNTER — Inpatient Hospital Stay: Payer: Medicare Other | Attending: Oncology | Admitting: Oncology

## 2017-10-13 VITALS — BP 134/77 | HR 16 | Ht 65.0 in | Wt 121.0 lb

## 2017-10-13 VITALS — BP 108/75 | HR 106 | Temp 98.3°F | Resp 20 | Wt 121.0 lb

## 2017-10-13 DIAGNOSIS — R319 Hematuria, unspecified: Secondary | ICD-10-CM

## 2017-10-13 DIAGNOSIS — M899 Disorder of bone, unspecified: Secondary | ICD-10-CM | POA: Insufficient documentation

## 2017-10-13 DIAGNOSIS — R310 Gross hematuria: Secondary | ICD-10-CM | POA: Diagnosis not present

## 2017-10-13 DIAGNOSIS — R350 Frequency of micturition: Secondary | ICD-10-CM | POA: Diagnosis not present

## 2017-10-13 DIAGNOSIS — K5900 Constipation, unspecified: Secondary | ICD-10-CM | POA: Diagnosis not present

## 2017-10-13 DIAGNOSIS — R110 Nausea: Secondary | ICD-10-CM | POA: Diagnosis not present

## 2017-10-13 DIAGNOSIS — C61 Malignant neoplasm of prostate: Secondary | ICD-10-CM | POA: Diagnosis not present

## 2017-10-13 DIAGNOSIS — C7951 Secondary malignant neoplasm of bone: Principal | ICD-10-CM

## 2017-10-13 DIAGNOSIS — C787 Secondary malignant neoplasm of liver and intrahepatic bile duct: Secondary | ICD-10-CM | POA: Diagnosis not present

## 2017-10-13 DIAGNOSIS — E876 Hypokalemia: Secondary | ICD-10-CM | POA: Diagnosis not present

## 2017-10-13 DIAGNOSIS — R52 Pain, unspecified: Secondary | ICD-10-CM | POA: Diagnosis not present

## 2017-10-13 DIAGNOSIS — Z720 Tobacco use: Secondary | ICD-10-CM | POA: Diagnosis not present

## 2017-10-13 LAB — CBC WITH DIFFERENTIAL/PLATELET
BASOS PCT: 1 %
Basophils Absolute: 0.1 10*3/uL (ref 0–0.1)
EOS PCT: 0 %
Eosinophils Absolute: 0 10*3/uL (ref 0–0.7)
HCT: 23.6 % — ABNORMAL LOW (ref 40.0–52.0)
HEMOGLOBIN: 7.6 g/dL — AB (ref 13.0–18.0)
Lymphocytes Relative: 8 %
Lymphs Abs: 1 10*3/uL (ref 1.0–3.6)
MCH: 24 pg — ABNORMAL LOW (ref 26.0–34.0)
MCHC: 32.1 g/dL (ref 32.0–36.0)
MCV: 74.6 fL — ABNORMAL LOW (ref 80.0–100.0)
Monocytes Absolute: 0.9 10*3/uL (ref 0.2–1.0)
Monocytes Relative: 8 %
NEUTROS PCT: 83 %
Neutro Abs: 10.5 10*3/uL — ABNORMAL HIGH (ref 1.4–6.5)
PLATELETS: 729 10*3/uL — AB (ref 150–440)
RBC: 3.16 MIL/uL — AB (ref 4.40–5.90)
RDW: 18.1 % — ABNORMAL HIGH (ref 11.5–14.5)
WBC: 12.6 10*3/uL — AB (ref 3.8–10.6)

## 2017-10-13 LAB — COMPREHENSIVE METABOLIC PANEL
ALBUMIN: 2.4 g/dL — AB (ref 3.5–5.0)
ALK PHOS: 262 U/L — AB (ref 38–126)
ALT: 15 U/L — AB (ref 17–63)
ANION GAP: 14 (ref 5–15)
AST: 22 U/L (ref 15–41)
BUN: 10 mg/dL (ref 6–20)
CALCIUM: 8.5 mg/dL — AB (ref 8.9–10.3)
CHLORIDE: 97 mmol/L — AB (ref 101–111)
CO2: 22 mmol/L (ref 22–32)
Creatinine, Ser: 0.46 mg/dL — ABNORMAL LOW (ref 0.61–1.24)
GFR calc non Af Amer: >60 mL/min (ref >60)
GLUCOSE: 112 mg/dL — AB (ref 65–99)
Potassium: 3.2 mmol/L — ABNORMAL LOW (ref 3.5–5.1)
Sodium: 133 mmol/L — ABNORMAL LOW (ref 135–145)
Total Bilirubin: 0.4 mg/dL (ref 0.3–1.2)
Total Protein: 7 g/dL (ref 6.5–8.1)

## 2017-10-13 LAB — PSA: Prostatic Specific Antigen: 170 ng/mL — ABNORMAL HIGH (ref 0.00–4.00)

## 2017-10-13 MED ORDER — OXYCODONE HCL 10 MG PO TABS: 10.0000 mg | ORAL_TABLET | Freq: Four times a day (QID) | ORAL | 0 refills | Status: DC | PRN

## 2017-10-13 NOTE — Telephone Encounter (Signed)
Spoke with pt wife in reference to gross hematuria and burning with foley. Inquired about pt water in take. Wife stated that pt drinks only 1 cup of water a day. Reinforced with wife the more water intake pt has the less blood that will be in foley bag. Reinforced with wife that the burning with foley is normal but could be less with more fluid in take. Reinforced with wife pt should have at least 1.5L of fluid daily. Wife voiced understanding of whole conversation.

## 2017-10-13 NOTE — Progress Notes (Signed)
Bladder Scan:0- foley in place Patient cannot void: Performed By: Toniann Fail, LPN

## 2017-10-13 NOTE — Progress Notes (Signed)
Pt in for follow up, states in pain "all the time".  States meds "help some. Reports needs refill on oxycodone.  Pt also reports urinary catheter is leaking. Instructed to call urologist office if continues.

## 2017-10-13 NOTE — Progress Notes (Signed)
10/13/2017 3:56 PM   Genevieve Norlander. 1960-08-08 [id number]  Referring provider: No referring provider defined for this encounter.  Chief Complaint  Patient presents with  . Hematuria    HPI: 57 year old male with stage IV high risk prostate cancer with metastasis to liver and bone.  He has recently had significant progression of his disease.  Last PSA was 135.  He has refused Zometa and or Xgeva.  He has also refused Lupron.  He has Zytiga but has not started taking it.  He presented to the ED on 10/07/2017 complaining of gross hematuria.  He was having lower abdominal pain and a Foley catheter was placed with resolution of his discomfort.  He states he continues to have hematuria and his biggest complaint is abdominal pain associated with leakage of urine around the catheter.  He denies fever or chills.  A bladder scan prior to Foley catheter placement was only 14 mL.  No clots were present on initial placement of the catheter.   PMH: Past Medical History:  Diagnosis Date  . Cancer of prostate ([place]) 12/09/2014  . Cerebral seizure   . Neuromuscular disorder ([place])    brain tumor at 58 years of age  . Stroke [facility])    at 57 years of age    Surgical History: No past surgical history on file.  Home Medications:  Allergies as of 10/13/2017   No Known Allergies     Medication List        Accurate as of 10/13/17  3:56 PM. Always use your most recent med list.          cephALEXin 500 MG capsule Commonly known as:  KEFLEX Take 1 capsule (500 mg total) by mouth 2 (two) times daily for 10 days.   lactulose 10 GM/15ML solution Commonly known as:  CHRONULAC Take 15 mLs (10 g total) by mouth 2 (two) times daily as needed for mild constipation.   Oxycodone HCl 10 MG Tabs Take 1 tablet (10 mg total) by mouth every 6 (six) hours as needed.   prochlorperazine 10 MG tablet Commonly known as:  COMPAZINE Take 1 tablet (10 mg total) by mouth every 6 (six) hours as needed for  nausea or vomiting.   senna-docusate 8.6-50 MG tablet Commonly known as:  Senokot-S Take by mouth.   tamsulosin 0.4 MG Caps capsule Commonly known as:  FLOMAX Take 1 capsule (0.4 mg total) by mouth daily.       Allergies: No Known Allergies  Family History: No family history on file.  Social History:  reports that he has been smoking cigarettes.  He has been smoking about 0.50 packs per day. he has never used smokeless tobacco. He reports that he does not drink alcohol or use drugs.  ROS: UROLOGY Frequent Urination?: No Hard to postpone urination?: No Burning/pain with urination?: Yes Get up at night to urinate?: No Leakage of urine?: Yes Urine stream starts and stops?: No Trouble starting stream?: No Do you have to strain to urinate?: No Blood in urine?: Yes Urinary tract infection?: No Sexually transmitted disease?: No Injury to kidneys or bladder?: No Painful intercourse?: No Weak stream?: No Erection problems?: No Penile pain?: No  Gastrointestinal Nausea?: Yes Vomiting?: No Indigestion/heartburn?: Yes Diarrhea?: No Constipation?: No  Constitutional Fever: No Night sweats?: No Weight loss?: No Fatigue?: Yes  Skin Skin rash/lesions?: No Itching?: No  Eyes Blurred vision?: No Double vision?: No  Ears/Nose/Throat Sore throat?: No Sinus problems?: No  Hematologic/Lymphatic Swollen glands?:  No Easy bruising?: No  Cardiovascular Leg swelling?: No Chest pain?: No  Respiratory Cough?: No Shortness of breath?: Yes  Endocrine Excessive thirst?: No  Musculoskeletal Back pain?: Yes Joint pain?: No  Neurological Headaches?: No Dizziness?: No  Psychologic Depression?: No Anxiety?: No  Physical Exam: BP 134/77   Pulse (!) 16   Ht 5\' 5"  (1.651 m)   Wt 121 lb (54.9 kg)   BMI 20.14 kg/m   Constitutional: Thin male, no acute distress. HEENT: [place] AT, moist mucus membranes.  Trachea midline, no masses. Cardiovascular: No clubbing,  cyanosis, or edema. Respiratory: Normal respiratory effort, no increased work of breathing. GI: Abdomen is soft, nontender, nondistended, no abdominal masses GU: No CVA tenderness.  2 French Foley catheter draining dark bloody urine. Lymph: No cervical or inguinal lymphadenopathy. Skin: No rashes, bruises or suspicious lesions. Neurologic: Grossly intact, no focal deficits, moving all 4 extremities. Psychiatric: Normal mood and affect.  Laboratory Data: Lab Results  Component Value Date   WBC 12.6 (H) 10/13/2017   HGB 7.6 (L) 10/13/2017   HCT 23.6 (L) 10/13/2017   MCV 74.6 (L) 10/13/2017   PLT 729 (H) 10/13/2017    Lab Results  Component Value Date   CREATININE 0.46 (L) 10/13/2017   His 14 French Foley catheter was removed and replaced with a 20 French catheter which was irrigated until light pink in color.    Assessment & Plan:   Patient with metastatic prostate cancer refusing treatment and hospice care.  Potential causes of hematuria were reviewed including prostate cancer, invasion of bladder secondary to prostate cancer and BPH.  Discussed conservative management with irrigation and Foley catheter drainage.  I also discussed cystoscopy.  He has elected conservative management initially.  He was instructed to call for worsening hematuria.  If his urine clears he will return within the next 5-7 days for catheter removal.    Abbie Sons, MD  [facility] [address], [place], [place] [address] [phone]

## 2017-10-13 NOTE — Progress Notes (Signed)
Cath Change/ Replacement  Patient is present today for a catheter change due to urinary retention.  26ml of water was removed from the balloon, a 14FR foley cath was removed with out difficulty.  Patient was cleaned and prepped in a sterile fashion with betadine and 2% lidocaine jelly was instilled into the urethra. A 20 FR foley cath was replaced into the bladder no complications were noted Urine return was noted minimal and urine was red in color. The balloon was filled with 89ml of sterile water. A leg bag was attached for drainage.  A night bag was also given to the patient and patient was given instruction on how to change from one bag to another. Patient was given proper instruction on catheter care.    Preformed by: Elberta Leatherwood, CMA  Bladder Irrigation  Due to Gross hematuria, blood clots patient is present today for a bladder irrigation. Patient was cleaned and prepped in a sterile fashion. 260 ml of saline/sterile water was instilled and irrigated into the bladder with a 65ml [place] syringe through the catheter in place.  After irrigation urine flow was noted no complications were noted catheter is now draining fine.  Catheter was reattached to the leg bag for drainage. Patient tolerated well.   Preformed by: Elberta Leatherwood, CMA

## 2017-10-14 ENCOUNTER — Encounter: Payer: Self-pay | Admitting: Urology

## 2017-10-15 ENCOUNTER — Other Ambulatory Visit: Payer: Medicare Other

## 2017-10-16 ENCOUNTER — Ambulatory Visit: Payer: Medicare Other | Admitting: Urology

## 2017-10-19 ENCOUNTER — Ambulatory Visit: Payer: Medicare Other | Admitting: Urology

## 2017-10-20 ENCOUNTER — Ambulatory Visit: Payer: Medicare Other

## 2017-10-20 VITALS — BP 126/76 | HR 109 | Resp 16 | Ht 66.0 in | Wt 125.0 lb

## 2017-10-20 DIAGNOSIS — R339 Retention of urine, unspecified: Secondary | ICD-10-CM

## 2017-10-20 NOTE — Progress Notes (Signed)
Catheter Removal  Patient is present today for a catheter removal.  56ml of water was drained from the balloon. A 20 FR foley cath was removed from the bladder no complications were noted . Patient tolerated well. No blood/blood clots were noted during this visit.   Preformed by: Cristie Hem, CMA   Follow up/ Additional notes: return this afternoon for PVR.

## 2017-10-21 ENCOUNTER — Telehealth: Payer: Self-pay | Admitting: *Deleted

## 2017-10-21 LAB — BLADDER SCAN AMB NON-IMAGING

## 2017-10-21 NOTE — Telephone Encounter (Signed)
Roger Sparks called asking for return call regarding medicine. She is asking for refill of his Zytiga. Tthen I got a call from Hospice that patient wants to continue with his chemotherapy and so does not want hospice at this time.THey are asking for Palliative Care order

## 2017-10-21 NOTE — Telephone Encounter (Signed)
[facility] and order outpatient palliative care.

## 2017-10-21 NOTE — Telephone Encounter (Signed)
What about Zytiga? No longer on his medicine list

## 2017-10-21 NOTE — Telephone Encounter (Signed)
He reported he had an entire bottle at home.

## 2017-10-22 ENCOUNTER — Telehealth: Payer: Self-pay | Admitting: Pharmacist

## 2017-10-22 ENCOUNTER — Other Ambulatory Visit: Payer: Self-pay | Admitting: Oncology

## 2017-10-22 DIAGNOSIS — C61 Malignant neoplasm of prostate: Secondary | ICD-10-CM

## 2017-10-22 DIAGNOSIS — C7951 Secondary malignant neoplasm of bone: Principal | ICD-10-CM

## 2017-10-22 MED ORDER — PREDNISONE 5 MG PO TABS: 5.0000 mg | ORAL_TABLET | Freq: Two times a day (BID) | ORAL | 1 refills | Status: DC

## 2017-10-22 MED ORDER — ABIRATERONE ACETATE 250 MG PO TABS: 1000.0000 mg | ORAL_TABLET | Freq: Every day | ORAL | 1 refills | Status: DC

## 2017-10-22 NOTE — Telephone Encounter (Signed)
Oral Oncology Pharmacist Encounter  Received new prescription for Zytiga (abiraterone) for the treatment of metastatic prostate cancer in conjunction with prednisone, planned duration until disease progression or unacceptable drug toxicity.  CMP from 10/13/17 and BP from 10/20/17 assessed, no relevant lab abnormalities. Prescription dose and frequency assessed.   Current medication list in Epic reviewed, one relevant DDIs with abiraterone identified: -Abiraterone may increase the concentrations of Flomax (tamsulosin). No dose adjustment needed at this time. Patient should be monitored for adverse events related to more tamsulosin exposure.   Prescription has been e-scribed to the [facility] for benefits analysis and approval.  Oral Oncology Clinic will continue to follow for insurance authorization, copayment issues, initial counseling and start date.  Darl Pikes, PharmD, BCPS Hematology/Oncology Clinical Pharmacist [facility] [phone]  10/22/2017 9:47 AM

## 2017-10-22 NOTE — Telephone Encounter (Signed)
Wife said Hospice nurse said it expired in 2017

## 2017-10-22 NOTE — Telephone Encounter (Signed)
Palliative [facility] referral partially filled out and given to [facility] for completion and to get physician signature

## 2017-10-22 NOTE — Telephone Encounter (Signed)
New script printed with prednisone and [facility] to [facility].  He needs to bring in the prescription bottle every visit for pill count to assess his compliance. If he is not taking as directed, I will not refill.  Thanks.

## 2017-10-22 NOTE — Telephone Encounter (Signed)
I spoke with wife Judson Roch and let her know that a new prescription is being sent to pharmacy and that he MUST bring his pill bottle in every time he comes to see physician. She asked if he could take the pills from 2018 and I told her no not if that was when they expired and she said they did expire in 2018 and if he could not take them that he needed more. I again explained to her that we were sending in new prescription for it and that Oral Chemotherapy Pharmacist will get in touch with them. I also instructed her again that he MUST bring his pill bottles to his appointment every time her comes in. She repeated this back to me

## 2017-10-27 DIAGNOSIS — Z515 Encounter for palliative care: Secondary | ICD-10-CM | POA: Diagnosis not present

## 2017-10-27 DIAGNOSIS — R52 Pain, unspecified: Secondary | ICD-10-CM | POA: Diagnosis not present

## 2017-10-27 DIAGNOSIS — K5900 Constipation, unspecified: Secondary | ICD-10-CM | POA: Diagnosis not present

## 2017-10-27 DIAGNOSIS — C61 Malignant neoplasm of prostate: Secondary | ICD-10-CM | POA: Diagnosis not present

## 2017-10-27 DIAGNOSIS — R110 Nausea: Secondary | ICD-10-CM | POA: Diagnosis not present

## 2017-10-28 ENCOUNTER — Telehealth: Payer: Self-pay | Admitting: *Deleted

## 2017-10-28 NOTE — Telephone Encounter (Signed)
Patient has decided that he wants to come off the oral chemotherapy and wants hospice services. They still have order from 14 days ago and con use that if in agreement. OK per Lorretta Harp, NP for D [facility] Ormond. Neoma Laming informed

## 2017-10-29 ENCOUNTER — Other Ambulatory Visit: Payer: Self-pay | Admitting: *Deleted

## 2017-10-29 DIAGNOSIS — R627 Adult failure to thrive: Secondary | ICD-10-CM | POA: Diagnosis not present

## 2017-10-29 DIAGNOSIS — M4800 Spinal stenosis, site unspecified: Secondary | ICD-10-CM | POA: Diagnosis not present

## 2017-10-29 DIAGNOSIS — C61 Malignant neoplasm of prostate: Secondary | ICD-10-CM | POA: Diagnosis not present

## 2017-10-29 DIAGNOSIS — Z681 Body mass index (BMI) 19 or less, adult: Secondary | ICD-10-CM | POA: Diagnosis not present

## 2017-10-29 DIAGNOSIS — R634 Abnormal weight loss: Secondary | ICD-10-CM | POA: Diagnosis not present

## 2017-10-29 DIAGNOSIS — C7951 Secondary malignant neoplasm of bone: Secondary | ICD-10-CM | POA: Diagnosis not present

## 2017-10-29 DIAGNOSIS — K5900 Constipation, unspecified: Secondary | ICD-10-CM | POA: Diagnosis not present

## 2017-10-29 DIAGNOSIS — I69352 Hemiplegia and hemiparesis following cerebral infarction affecting left dominant side: Secondary | ICD-10-CM | POA: Diagnosis not present

## 2017-10-29 DIAGNOSIS — C787 Secondary malignant neoplasm of liver and intrahepatic bile duct: Secondary | ICD-10-CM | POA: Diagnosis not present

## 2017-10-29 NOTE — Telephone Encounter (Signed)
Yes to refill, but please encourage the nurses to convince him to take a long acting narcotic.

## 2017-10-29 NOTE — Telephone Encounter (Signed)
Heber[place]Over[place], thanks.  Alyson, please note for your records.  Thanks!

## 2017-10-29 NOTE — Telephone Encounter (Signed)
Lattie Haw with hospice called and states patient needs refill of Oxycodone and is asking if dose can be increased patient rating pain 10/10 for past 3 days. He is not on long acting pain medicine. She also asks for sleep medicine possibly Trazodone. Please advise

## 2017-10-30 ENCOUNTER — Other Ambulatory Visit: Payer: Self-pay | Admitting: *Deleted

## 2017-10-30 DIAGNOSIS — R627 Adult failure to thrive: Secondary | ICD-10-CM | POA: Diagnosis not present

## 2017-10-30 DIAGNOSIS — C7951 Secondary malignant neoplasm of bone: Secondary | ICD-10-CM | POA: Diagnosis not present

## 2017-10-30 DIAGNOSIS — C61 Malignant neoplasm of prostate: Secondary | ICD-10-CM | POA: Diagnosis not present

## 2017-10-30 DIAGNOSIS — R634 Abnormal weight loss: Secondary | ICD-10-CM | POA: Diagnosis not present

## 2017-10-30 DIAGNOSIS — C787 Secondary malignant neoplasm of liver and intrahepatic bile duct: Secondary | ICD-10-CM | POA: Diagnosis not present

## 2017-10-30 DIAGNOSIS — Z681 Body mass index (BMI) 19 or less, adult: Secondary | ICD-10-CM | POA: Diagnosis not present

## 2017-10-30 MED ORDER — OXYCODONE HCL 10 MG PO TABS: 10.0000 mg | ORAL_TABLET | Freq: Four times a day (QID) | ORAL | 0 refills | Status: DC | PRN

## 2017-10-30 MED ORDER — TRAZODONE HCL 50 MG PO TABS: 50.0000 mg | ORAL_TABLET | Freq: Every day | ORAL | 0 refills | Status: AC

## 2017-10-30 NOTE — Telephone Encounter (Signed)
Patient refuses to take MS Contin stating it makes his pain worse. [facility] triage nurse will contact Lorriane Shire his nurses to discuss other long acting medicine options. What about the Trazodone? Dose 50 or 100 mg for insomnia?

## 2017-10-30 NOTE — Addendum Note (Signed)
Addended by: Betti Cruz on: 10/30/2017 09:19 AM   Modules accepted: Orders

## 2017-11-02 ENCOUNTER — Telehealth: Payer: Self-pay | Admitting: *Deleted

## 2017-11-02 DIAGNOSIS — R634 Abnormal weight loss: Secondary | ICD-10-CM | POA: Diagnosis not present

## 2017-11-02 DIAGNOSIS — C7951 Secondary malignant neoplasm of bone: Secondary | ICD-10-CM | POA: Diagnosis not present

## 2017-11-02 DIAGNOSIS — C787 Secondary malignant neoplasm of liver and intrahepatic bile duct: Secondary | ICD-10-CM | POA: Diagnosis not present

## 2017-11-02 DIAGNOSIS — C61 Malignant neoplasm of prostate: Secondary | ICD-10-CM | POA: Diagnosis not present

## 2017-11-02 DIAGNOSIS — Z681 Body mass index (BMI) 19 or less, adult: Secondary | ICD-10-CM | POA: Diagnosis not present

## 2017-11-02 DIAGNOSIS — R627 Adult failure to thrive: Secondary | ICD-10-CM | POA: Diagnosis not present

## 2017-11-02 NOTE — Telephone Encounter (Signed)
Per Hospice nurse Lorriane Shire, patient has agreed to take MS ER. He is currently taking Oxycodone 10 mg every 6 hours for pain (ordered as needed). Please advise how to proceed with MS ER

## 2017-11-02 NOTE — Telephone Encounter (Signed)
Per Dr Grayland Ormond ok to restart MS ER at most recent dose he was given 15 mg tabs. He has 6 tabs on hand and she does not want a refill at this time until it is determined that he can tolerate it as it was making him nauseated, But she had him to take with nausea medicine. She will call back when needs refill.

## 2017-11-03 DIAGNOSIS — C61 Malignant neoplasm of prostate: Secondary | ICD-10-CM | POA: Diagnosis not present

## 2017-11-03 DIAGNOSIS — R634 Abnormal weight loss: Secondary | ICD-10-CM | POA: Diagnosis not present

## 2017-11-03 DIAGNOSIS — C7951 Secondary malignant neoplasm of bone: Secondary | ICD-10-CM | POA: Diagnosis not present

## 2017-11-03 DIAGNOSIS — Z681 Body mass index (BMI) 19 or less, adult: Secondary | ICD-10-CM | POA: Diagnosis not present

## 2017-11-03 DIAGNOSIS — R627 Adult failure to thrive: Secondary | ICD-10-CM | POA: Diagnosis not present

## 2017-11-03 DIAGNOSIS — C787 Secondary malignant neoplasm of liver and intrahepatic bile duct: Secondary | ICD-10-CM | POA: Diagnosis not present

## 2017-11-04 ENCOUNTER — Telehealth: Payer: Self-pay | Admitting: Oncology

## 2017-11-04 NOTE — Telephone Encounter (Signed)
Appts cancelled

## 2017-11-05 ENCOUNTER — Other Ambulatory Visit: Payer: Self-pay | Admitting: *Deleted

## 2017-11-05 DIAGNOSIS — R634 Abnormal weight loss: Secondary | ICD-10-CM | POA: Diagnosis not present

## 2017-11-05 DIAGNOSIS — C787 Secondary malignant neoplasm of liver and intrahepatic bile duct: Secondary | ICD-10-CM | POA: Diagnosis not present

## 2017-11-05 DIAGNOSIS — Z681 Body mass index (BMI) 19 or less, adult: Secondary | ICD-10-CM | POA: Diagnosis not present

## 2017-11-05 DIAGNOSIS — R627 Adult failure to thrive: Secondary | ICD-10-CM | POA: Diagnosis not present

## 2017-11-05 DIAGNOSIS — C7951 Secondary malignant neoplasm of bone: Secondary | ICD-10-CM | POA: Diagnosis not present

## 2017-11-05 DIAGNOSIS — C61 Malignant neoplasm of prostate: Secondary | ICD-10-CM | POA: Diagnosis not present

## 2017-11-05 MED ORDER — MORPHINE SULFATE ER 15 MG PO TBCR: 15.0000 mg | EXTENDED_RELEASE_TABLET | Freq: Two times a day (BID) | ORAL | 0 refills | Status: AC

## 2017-11-05 NOTE — Telephone Encounter (Signed)
Lorriane Shire RN with Hospice called to report that patient is taking his MS ER as order and it is helping relieve his pain, it does make him a little sleepy, but he is continuing to take it and needs a refill will take last available dose tomorrow.morning.

## 2017-11-06 DIAGNOSIS — C61 Malignant neoplasm of prostate: Secondary | ICD-10-CM | POA: Diagnosis not present

## 2017-11-06 DIAGNOSIS — C787 Secondary malignant neoplasm of liver and intrahepatic bile duct: Secondary | ICD-10-CM | POA: Diagnosis not present

## 2017-11-06 DIAGNOSIS — R634 Abnormal weight loss: Secondary | ICD-10-CM | POA: Diagnosis not present

## 2017-11-06 DIAGNOSIS — Z681 Body mass index (BMI) 19 or less, adult: Secondary | ICD-10-CM | POA: Diagnosis not present

## 2017-11-06 DIAGNOSIS — R627 Adult failure to thrive: Secondary | ICD-10-CM | POA: Diagnosis not present

## 2017-11-06 DIAGNOSIS — C7951 Secondary malignant neoplasm of bone: Secondary | ICD-10-CM | POA: Diagnosis not present

## 2017-11-10 ENCOUNTER — Other Ambulatory Visit: Payer: Medicare Other

## 2017-11-10 ENCOUNTER — Ambulatory Visit: Payer: Medicare Other | Admitting: Oncology

## 2017-11-12 ENCOUNTER — Other Ambulatory Visit: Payer: Self-pay | Admitting: *Deleted

## 2017-11-12 DIAGNOSIS — C61 Malignant neoplasm of prostate: Secondary | ICD-10-CM | POA: Diagnosis not present

## 2017-11-12 DIAGNOSIS — R634 Abnormal weight loss: Secondary | ICD-10-CM | POA: Diagnosis not present

## 2017-11-12 DIAGNOSIS — C787 Secondary malignant neoplasm of liver and intrahepatic bile duct: Secondary | ICD-10-CM | POA: Diagnosis not present

## 2017-11-12 DIAGNOSIS — C7951 Secondary malignant neoplasm of bone: Secondary | ICD-10-CM | POA: Diagnosis not present

## 2017-11-12 DIAGNOSIS — Z681 Body mass index (BMI) 19 or less, adult: Secondary | ICD-10-CM | POA: Diagnosis not present

## 2017-11-12 DIAGNOSIS — R627 Adult failure to thrive: Secondary | ICD-10-CM | POA: Diagnosis not present

## 2017-11-12 MED ORDER — OXYCODONE HCL 10 MG PO TABS: 10.0000 mg | ORAL_TABLET | Freq: Four times a day (QID) | ORAL | 0 refills | Status: AC | PRN

## 2017-11-13 NOTE — Addendum Note (Signed)
Addended by: Darl Pikes on: 11/13/2017 03:05 PM   Modules accepted: Orders

## 2017-11-13 NOTE — Telephone Encounter (Signed)
Oral Chemotherapy Pharmacist Encounter   Patient decided to proceed with hospice. Zytiga no longer needed.   Darl Pikes, PharmD, BCPS Hematology/Oncology Clinical Pharmacist [facility] [phone]  11/13/2017 3:04 PM

## 2017-11-20 DIAGNOSIS — C787 Secondary malignant neoplasm of liver and intrahepatic bile duct: Secondary | ICD-10-CM | POA: Diagnosis not present

## 2017-11-20 DIAGNOSIS — C7951 Secondary malignant neoplasm of bone: Secondary | ICD-10-CM | POA: Diagnosis not present

## 2017-11-20 DIAGNOSIS — R634 Abnormal weight loss: Secondary | ICD-10-CM | POA: Diagnosis not present

## 2017-11-20 DIAGNOSIS — Z681 Body mass index (BMI) 19 or less, adult: Secondary | ICD-10-CM | POA: Diagnosis not present

## 2017-11-20 DIAGNOSIS — C61 Malignant neoplasm of prostate: Secondary | ICD-10-CM | POA: Diagnosis not present

## 2017-11-20 DIAGNOSIS — R627 Adult failure to thrive: Secondary | ICD-10-CM | POA: Diagnosis not present

## 2017-11-24 DIAGNOSIS — R627 Adult failure to thrive: Secondary | ICD-10-CM | POA: Diagnosis not present

## 2017-11-24 DIAGNOSIS — C61 Malignant neoplasm of prostate: Secondary | ICD-10-CM | POA: Diagnosis not present

## 2017-11-24 DIAGNOSIS — Z681 Body mass index (BMI) 19 or less, adult: Secondary | ICD-10-CM | POA: Diagnosis not present

## 2017-11-24 DIAGNOSIS — C7951 Secondary malignant neoplasm of bone: Secondary | ICD-10-CM | POA: Diagnosis not present

## 2017-11-24 DIAGNOSIS — R634 Abnormal weight loss: Secondary | ICD-10-CM | POA: Diagnosis not present

## 2017-11-24 DIAGNOSIS — C787 Secondary malignant neoplasm of liver and intrahepatic bile duct: Secondary | ICD-10-CM | POA: Diagnosis not present

## 2017-11-25 DIAGNOSIS — K5900 Constipation, unspecified: Secondary | ICD-10-CM | POA: Diagnosis not present

## 2017-11-25 DIAGNOSIS — R634 Abnormal weight loss: Secondary | ICD-10-CM | POA: Diagnosis not present

## 2017-11-25 DIAGNOSIS — C61 Malignant neoplasm of prostate: Secondary | ICD-10-CM | POA: Diagnosis not present

## 2017-11-25 DIAGNOSIS — C787 Secondary malignant neoplasm of liver and intrahepatic bile duct: Secondary | ICD-10-CM | POA: Diagnosis not present

## 2017-11-25 DIAGNOSIS — M4800 Spinal stenosis, site unspecified: Secondary | ICD-10-CM | POA: Diagnosis not present

## 2017-11-25 DIAGNOSIS — Z681 Body mass index (BMI) 19 or less, adult: Secondary | ICD-10-CM | POA: Diagnosis not present

## 2017-11-25 DIAGNOSIS — C7951 Secondary malignant neoplasm of bone: Secondary | ICD-10-CM | POA: Diagnosis not present

## 2017-11-25 DIAGNOSIS — I69352 Hemiplegia and hemiparesis following cerebral infarction affecting left dominant side: Secondary | ICD-10-CM | POA: Diagnosis not present

## 2017-11-25 DIAGNOSIS — R627 Adult failure to thrive: Secondary | ICD-10-CM | POA: Diagnosis not present

## 2017-11-26 DIAGNOSIS — C7951 Secondary malignant neoplasm of bone: Secondary | ICD-10-CM | POA: Diagnosis not present

## 2017-11-26 DIAGNOSIS — C61 Malignant neoplasm of prostate: Secondary | ICD-10-CM | POA: Diagnosis not present

## 2017-11-26 DIAGNOSIS — Z681 Body mass index (BMI) 19 or less, adult: Secondary | ICD-10-CM | POA: Diagnosis not present

## 2017-11-26 DIAGNOSIS — C787 Secondary malignant neoplasm of liver and intrahepatic bile duct: Secondary | ICD-10-CM | POA: Diagnosis not present

## 2017-11-26 DIAGNOSIS — R627 Adult failure to thrive: Secondary | ICD-10-CM | POA: Diagnosis not present

## 2017-11-26 DIAGNOSIS — R634 Abnormal weight loss: Secondary | ICD-10-CM | POA: Diagnosis not present

## 2017-11-27 DIAGNOSIS — C61 Malignant neoplasm of prostate: Secondary | ICD-10-CM | POA: Diagnosis not present

## 2017-11-27 DIAGNOSIS — Z681 Body mass index (BMI) 19 or less, adult: Secondary | ICD-10-CM | POA: Diagnosis not present

## 2017-11-27 DIAGNOSIS — R634 Abnormal weight loss: Secondary | ICD-10-CM | POA: Diagnosis not present

## 2017-11-27 DIAGNOSIS — C7951 Secondary malignant neoplasm of bone: Secondary | ICD-10-CM | POA: Diagnosis not present

## 2017-11-27 DIAGNOSIS — C787 Secondary malignant neoplasm of liver and intrahepatic bile duct: Secondary | ICD-10-CM | POA: Diagnosis not present

## 2017-11-27 DIAGNOSIS — R627 Adult failure to thrive: Secondary | ICD-10-CM | POA: Diagnosis not present

## 2017-11-30 DIAGNOSIS — C787 Secondary malignant neoplasm of liver and intrahepatic bile duct: Secondary | ICD-10-CM | POA: Diagnosis not present

## 2017-11-30 DIAGNOSIS — Z681 Body mass index (BMI) 19 or less, adult: Secondary | ICD-10-CM | POA: Diagnosis not present

## 2017-11-30 DIAGNOSIS — C61 Malignant neoplasm of prostate: Secondary | ICD-10-CM | POA: Diagnosis not present

## 2017-11-30 DIAGNOSIS — C7951 Secondary malignant neoplasm of bone: Secondary | ICD-10-CM | POA: Diagnosis not present

## 2017-11-30 DIAGNOSIS — R634 Abnormal weight loss: Secondary | ICD-10-CM | POA: Diagnosis not present

## 2017-11-30 DIAGNOSIS — R627 Adult failure to thrive: Secondary | ICD-10-CM | POA: Diagnosis not present

## 2017-12-04 DIAGNOSIS — R627 Adult failure to thrive: Secondary | ICD-10-CM | POA: Diagnosis not present

## 2017-12-04 DIAGNOSIS — C61 Malignant neoplasm of prostate: Secondary | ICD-10-CM | POA: Diagnosis not present

## 2017-12-04 DIAGNOSIS — Z681 Body mass index (BMI) 19 or less, adult: Secondary | ICD-10-CM | POA: Diagnosis not present

## 2017-12-04 DIAGNOSIS — R634 Abnormal weight loss: Secondary | ICD-10-CM | POA: Diagnosis not present

## 2017-12-04 DIAGNOSIS — C787 Secondary malignant neoplasm of liver and intrahepatic bile duct: Secondary | ICD-10-CM | POA: Diagnosis not present

## 2017-12-04 DIAGNOSIS — C7951 Secondary malignant neoplasm of bone: Secondary | ICD-10-CM | POA: Diagnosis not present

## 2017-12-05 DIAGNOSIS — C61 Malignant neoplasm of prostate: Secondary | ICD-10-CM | POA: Diagnosis not present

## 2017-12-05 DIAGNOSIS — C7951 Secondary malignant neoplasm of bone: Secondary | ICD-10-CM | POA: Diagnosis not present

## 2017-12-05 DIAGNOSIS — R634 Abnormal weight loss: Secondary | ICD-10-CM | POA: Diagnosis not present

## 2017-12-05 DIAGNOSIS — R627 Adult failure to thrive: Secondary | ICD-10-CM | POA: Diagnosis not present

## 2017-12-05 DIAGNOSIS — C787 Secondary malignant neoplasm of liver and intrahepatic bile duct: Secondary | ICD-10-CM | POA: Diagnosis not present

## 2017-12-05 DIAGNOSIS — Z681 Body mass index (BMI) 19 or less, adult: Secondary | ICD-10-CM | POA: Diagnosis not present

## 2017-12-07 DIAGNOSIS — C787 Secondary malignant neoplasm of liver and intrahepatic bile duct: Secondary | ICD-10-CM | POA: Diagnosis not present

## 2017-12-07 DIAGNOSIS — C7951 Secondary malignant neoplasm of bone: Secondary | ICD-10-CM | POA: Diagnosis not present

## 2017-12-07 DIAGNOSIS — C61 Malignant neoplasm of prostate: Secondary | ICD-10-CM | POA: Diagnosis not present

## 2017-12-07 DIAGNOSIS — R627 Adult failure to thrive: Secondary | ICD-10-CM | POA: Diagnosis not present

## 2017-12-07 DIAGNOSIS — R634 Abnormal weight loss: Secondary | ICD-10-CM | POA: Diagnosis not present

## 2017-12-07 DIAGNOSIS — Z681 Body mass index (BMI) 19 or less, adult: Secondary | ICD-10-CM | POA: Diagnosis not present

## 2017-12-10 DIAGNOSIS — C787 Secondary malignant neoplasm of liver and intrahepatic bile duct: Secondary | ICD-10-CM | POA: Diagnosis not present

## 2017-12-10 DIAGNOSIS — R627 Adult failure to thrive: Secondary | ICD-10-CM | POA: Diagnosis not present

## 2017-12-10 DIAGNOSIS — C61 Malignant neoplasm of prostate: Secondary | ICD-10-CM | POA: Diagnosis not present

## 2017-12-10 DIAGNOSIS — Z681 Body mass index (BMI) 19 or less, adult: Secondary | ICD-10-CM | POA: Diagnosis not present

## 2017-12-10 DIAGNOSIS — C7951 Secondary malignant neoplasm of bone: Secondary | ICD-10-CM | POA: Diagnosis not present

## 2017-12-10 DIAGNOSIS — R634 Abnormal weight loss: Secondary | ICD-10-CM | POA: Diagnosis not present

## 2017-12-16 DIAGNOSIS — C7951 Secondary malignant neoplasm of bone: Secondary | ICD-10-CM | POA: Diagnosis not present

## 2017-12-16 DIAGNOSIS — C787 Secondary malignant neoplasm of liver and intrahepatic bile duct: Secondary | ICD-10-CM | POA: Diagnosis not present

## 2017-12-16 DIAGNOSIS — Z681 Body mass index (BMI) 19 or less, adult: Secondary | ICD-10-CM | POA: Diagnosis not present

## 2017-12-16 DIAGNOSIS — C61 Malignant neoplasm of prostate: Secondary | ICD-10-CM | POA: Diagnosis not present

## 2017-12-16 DIAGNOSIS — R627 Adult failure to thrive: Secondary | ICD-10-CM | POA: Diagnosis not present

## 2017-12-16 DIAGNOSIS — R634 Abnormal weight loss: Secondary | ICD-10-CM | POA: Diagnosis not present

## 2017-12-18 DIAGNOSIS — C61 Malignant neoplasm of prostate: Secondary | ICD-10-CM | POA: Diagnosis not present

## 2017-12-18 DIAGNOSIS — R634 Abnormal weight loss: Secondary | ICD-10-CM | POA: Diagnosis not present

## 2017-12-18 DIAGNOSIS — R627 Adult failure to thrive: Secondary | ICD-10-CM | POA: Diagnosis not present

## 2017-12-18 DIAGNOSIS — C7951 Secondary malignant neoplasm of bone: Secondary | ICD-10-CM | POA: Diagnosis not present

## 2017-12-18 DIAGNOSIS — C787 Secondary malignant neoplasm of liver and intrahepatic bile duct: Secondary | ICD-10-CM | POA: Diagnosis not present

## 2017-12-18 DIAGNOSIS — Z681 Body mass index (BMI) 19 or less, adult: Secondary | ICD-10-CM | POA: Diagnosis not present

## 2017-12-22 DIAGNOSIS — R634 Abnormal weight loss: Secondary | ICD-10-CM | POA: Diagnosis not present

## 2017-12-22 DIAGNOSIS — C61 Malignant neoplasm of prostate: Secondary | ICD-10-CM | POA: Diagnosis not present

## 2017-12-22 DIAGNOSIS — R627 Adult failure to thrive: Secondary | ICD-10-CM | POA: Diagnosis not present

## 2017-12-22 DIAGNOSIS — C787 Secondary malignant neoplasm of liver and intrahepatic bile duct: Secondary | ICD-10-CM | POA: Diagnosis not present

## 2017-12-22 DIAGNOSIS — Z681 Body mass index (BMI) 19 or less, adult: Secondary | ICD-10-CM | POA: Diagnosis not present

## 2017-12-22 DIAGNOSIS — C7951 Secondary malignant neoplasm of bone: Secondary | ICD-10-CM | POA: Diagnosis not present

## 2017-12-25 DIAGNOSIS — R634 Abnormal weight loss: Secondary | ICD-10-CM | POA: Diagnosis not present

## 2017-12-25 DIAGNOSIS — R627 Adult failure to thrive: Secondary | ICD-10-CM | POA: Diagnosis not present

## 2017-12-25 DIAGNOSIS — C7951 Secondary malignant neoplasm of bone: Secondary | ICD-10-CM | POA: Diagnosis not present

## 2017-12-25 DIAGNOSIS — C61 Malignant neoplasm of prostate: Secondary | ICD-10-CM | POA: Diagnosis not present

## 2017-12-25 DIAGNOSIS — Z681 Body mass index (BMI) 19 or less, adult: Secondary | ICD-10-CM | POA: Diagnosis not present

## 2017-12-25 DIAGNOSIS — C787 Secondary malignant neoplasm of liver and intrahepatic bile duct: Secondary | ICD-10-CM | POA: Diagnosis not present

## 2017-12-26 DIAGNOSIS — I69352 Hemiplegia and hemiparesis following cerebral infarction affecting left dominant side: Secondary | ICD-10-CM | POA: Diagnosis not present

## 2017-12-26 DIAGNOSIS — L89152 Pressure ulcer of sacral region, stage 2: Secondary | ICD-10-CM | POA: Diagnosis not present

## 2017-12-26 DIAGNOSIS — R627 Adult failure to thrive: Secondary | ICD-10-CM | POA: Diagnosis not present

## 2017-12-26 DIAGNOSIS — Z681 Body mass index (BMI) 19 or less, adult: Secondary | ICD-10-CM | POA: Diagnosis not present

## 2017-12-26 DIAGNOSIS — M4800 Spinal stenosis, site unspecified: Secondary | ICD-10-CM | POA: Diagnosis not present

## 2017-12-26 DIAGNOSIS — C7951 Secondary malignant neoplasm of bone: Secondary | ICD-10-CM | POA: Diagnosis not present

## 2017-12-26 DIAGNOSIS — C61 Malignant neoplasm of prostate: Secondary | ICD-10-CM | POA: Diagnosis not present

## 2017-12-26 DIAGNOSIS — K5900 Constipation, unspecified: Secondary | ICD-10-CM | POA: Diagnosis not present

## 2017-12-26 DIAGNOSIS — R634 Abnormal weight loss: Secondary | ICD-10-CM | POA: Diagnosis not present

## 2017-12-26 DIAGNOSIS — C787 Secondary malignant neoplasm of liver and intrahepatic bile duct: Secondary | ICD-10-CM | POA: Diagnosis not present

## 2017-12-29 DIAGNOSIS — L89152 Pressure ulcer of sacral region, stage 2: Secondary | ICD-10-CM | POA: Diagnosis not present

## 2017-12-29 DIAGNOSIS — R634 Abnormal weight loss: Secondary | ICD-10-CM | POA: Diagnosis not present

## 2017-12-29 DIAGNOSIS — C61 Malignant neoplasm of prostate: Secondary | ICD-10-CM | POA: Diagnosis not present

## 2017-12-29 DIAGNOSIS — R627 Adult failure to thrive: Secondary | ICD-10-CM | POA: Diagnosis not present

## 2017-12-29 DIAGNOSIS — C787 Secondary malignant neoplasm of liver and intrahepatic bile duct: Secondary | ICD-10-CM | POA: Diagnosis not present

## 2017-12-29 DIAGNOSIS — C7951 Secondary malignant neoplasm of bone: Secondary | ICD-10-CM | POA: Diagnosis not present

## 2018-01-01 DIAGNOSIS — R634 Abnormal weight loss: Secondary | ICD-10-CM | POA: Diagnosis not present

## 2018-01-01 DIAGNOSIS — R627 Adult failure to thrive: Secondary | ICD-10-CM | POA: Diagnosis not present

## 2018-01-01 DIAGNOSIS — L89152 Pressure ulcer of sacral region, stage 2: Secondary | ICD-10-CM | POA: Diagnosis not present

## 2018-01-01 DIAGNOSIS — C7951 Secondary malignant neoplasm of bone: Secondary | ICD-10-CM | POA: Diagnosis not present

## 2018-01-01 DIAGNOSIS — C61 Malignant neoplasm of prostate: Secondary | ICD-10-CM | POA: Diagnosis not present

## 2018-01-01 DIAGNOSIS — C787 Secondary malignant neoplasm of liver and intrahepatic bile duct: Secondary | ICD-10-CM | POA: Diagnosis not present

## 2018-01-07 DIAGNOSIS — R627 Adult failure to thrive: Secondary | ICD-10-CM | POA: Diagnosis not present

## 2018-01-07 DIAGNOSIS — C787 Secondary malignant neoplasm of liver and intrahepatic bile duct: Secondary | ICD-10-CM | POA: Diagnosis not present

## 2018-01-07 DIAGNOSIS — C61 Malignant neoplasm of prostate: Secondary | ICD-10-CM | POA: Diagnosis not present

## 2018-01-07 DIAGNOSIS — C7951 Secondary malignant neoplasm of bone: Secondary | ICD-10-CM | POA: Diagnosis not present

## 2018-01-07 DIAGNOSIS — L89152 Pressure ulcer of sacral region, stage 2: Secondary | ICD-10-CM | POA: Diagnosis not present

## 2018-01-07 DIAGNOSIS — R634 Abnormal weight loss: Secondary | ICD-10-CM | POA: Diagnosis not present

## 2018-01-14 DIAGNOSIS — C787 Secondary malignant neoplasm of liver and intrahepatic bile duct: Secondary | ICD-10-CM | POA: Diagnosis not present

## 2018-01-14 DIAGNOSIS — R634 Abnormal weight loss: Secondary | ICD-10-CM | POA: Diagnosis not present

## 2018-01-14 DIAGNOSIS — C7951 Secondary malignant neoplasm of bone: Secondary | ICD-10-CM | POA: Diagnosis not present

## 2018-01-14 DIAGNOSIS — R627 Adult failure to thrive: Secondary | ICD-10-CM | POA: Diagnosis not present

## 2018-01-14 DIAGNOSIS — L89152 Pressure ulcer of sacral region, stage 2: Secondary | ICD-10-CM | POA: Diagnosis not present

## 2018-01-14 DIAGNOSIS — C61 Malignant neoplasm of prostate: Secondary | ICD-10-CM | POA: Diagnosis not present

## 2018-01-21 DIAGNOSIS — R627 Adult failure to thrive: Secondary | ICD-10-CM | POA: Diagnosis not present

## 2018-01-21 DIAGNOSIS — R634 Abnormal weight loss: Secondary | ICD-10-CM | POA: Diagnosis not present

## 2018-01-21 DIAGNOSIS — C787 Secondary malignant neoplasm of liver and intrahepatic bile duct: Secondary | ICD-10-CM | POA: Diagnosis not present

## 2018-01-21 DIAGNOSIS — C61 Malignant neoplasm of prostate: Secondary | ICD-10-CM | POA: Diagnosis not present

## 2018-01-21 DIAGNOSIS — C7951 Secondary malignant neoplasm of bone: Secondary | ICD-10-CM | POA: Diagnosis not present

## 2018-01-21 DIAGNOSIS — L89152 Pressure ulcer of sacral region, stage 2: Secondary | ICD-10-CM | POA: Diagnosis not present

## 2018-01-25 DIAGNOSIS — M4800 Spinal stenosis, site unspecified: Secondary | ICD-10-CM | POA: Diagnosis not present

## 2018-01-25 DIAGNOSIS — L89152 Pressure ulcer of sacral region, stage 2: Secondary | ICD-10-CM | POA: Diagnosis not present

## 2018-01-25 DIAGNOSIS — K5900 Constipation, unspecified: Secondary | ICD-10-CM | POA: Diagnosis not present

## 2018-01-25 DIAGNOSIS — Z681 Body mass index (BMI) 19 or less, adult: Secondary | ICD-10-CM | POA: Diagnosis not present

## 2018-01-25 DIAGNOSIS — C787 Secondary malignant neoplasm of liver and intrahepatic bile duct: Secondary | ICD-10-CM | POA: Diagnosis not present

## 2018-01-25 DIAGNOSIS — R634 Abnormal weight loss: Secondary | ICD-10-CM | POA: Diagnosis not present

## 2018-01-25 DIAGNOSIS — C61 Malignant neoplasm of prostate: Secondary | ICD-10-CM | POA: Diagnosis not present

## 2018-01-25 DIAGNOSIS — I69352 Hemiplegia and hemiparesis following cerebral infarction affecting left dominant side: Secondary | ICD-10-CM | POA: Diagnosis not present

## 2018-01-25 DIAGNOSIS — C7951 Secondary malignant neoplasm of bone: Secondary | ICD-10-CM | POA: Diagnosis not present

## 2018-01-25 DIAGNOSIS — R627 Adult failure to thrive: Secondary | ICD-10-CM | POA: Diagnosis not present

## 2018-02-04 DIAGNOSIS — L89152 Pressure ulcer of sacral region, stage 2: Secondary | ICD-10-CM | POA: Diagnosis not present

## 2018-02-04 DIAGNOSIS — R634 Abnormal weight loss: Secondary | ICD-10-CM | POA: Diagnosis not present

## 2018-02-04 DIAGNOSIS — R627 Adult failure to thrive: Secondary | ICD-10-CM | POA: Diagnosis not present

## 2018-02-04 DIAGNOSIS — C61 Malignant neoplasm of prostate: Secondary | ICD-10-CM | POA: Diagnosis not present

## 2018-02-04 DIAGNOSIS — C7951 Secondary malignant neoplasm of bone: Secondary | ICD-10-CM | POA: Diagnosis not present

## 2018-02-04 DIAGNOSIS — C787 Secondary malignant neoplasm of liver and intrahepatic bile duct: Secondary | ICD-10-CM | POA: Diagnosis not present

## 2018-02-08 DIAGNOSIS — C7951 Secondary malignant neoplasm of bone: Secondary | ICD-10-CM | POA: Diagnosis not present

## 2018-02-08 DIAGNOSIS — C787 Secondary malignant neoplasm of liver and intrahepatic bile duct: Secondary | ICD-10-CM | POA: Diagnosis not present

## 2018-02-08 DIAGNOSIS — R627 Adult failure to thrive: Secondary | ICD-10-CM | POA: Diagnosis not present

## 2018-02-08 DIAGNOSIS — L89152 Pressure ulcer of sacral region, stage 2: Secondary | ICD-10-CM | POA: Diagnosis not present

## 2018-02-08 DIAGNOSIS — R634 Abnormal weight loss: Secondary | ICD-10-CM | POA: Diagnosis not present

## 2018-02-08 DIAGNOSIS — C61 Malignant neoplasm of prostate: Secondary | ICD-10-CM | POA: Diagnosis not present

## 2018-02-09 DIAGNOSIS — R634 Abnormal weight loss: Secondary | ICD-10-CM | POA: Diagnosis not present

## 2018-02-09 DIAGNOSIS — C7951 Secondary malignant neoplasm of bone: Secondary | ICD-10-CM | POA: Diagnosis not present

## 2018-02-09 DIAGNOSIS — C787 Secondary malignant neoplasm of liver and intrahepatic bile duct: Secondary | ICD-10-CM | POA: Diagnosis not present

## 2018-02-09 DIAGNOSIS — R627 Adult failure to thrive: Secondary | ICD-10-CM | POA: Diagnosis not present

## 2018-02-09 DIAGNOSIS — C61 Malignant neoplasm of prostate: Secondary | ICD-10-CM | POA: Diagnosis not present

## 2018-02-09 DIAGNOSIS — L89152 Pressure ulcer of sacral region, stage 2: Secondary | ICD-10-CM | POA: Diagnosis not present

## 2018-02-11 DIAGNOSIS — R627 Adult failure to thrive: Secondary | ICD-10-CM | POA: Diagnosis not present

## 2018-02-11 DIAGNOSIS — C7951 Secondary malignant neoplasm of bone: Secondary | ICD-10-CM | POA: Diagnosis not present

## 2018-02-11 DIAGNOSIS — R634 Abnormal weight loss: Secondary | ICD-10-CM | POA: Diagnosis not present

## 2018-02-11 DIAGNOSIS — C787 Secondary malignant neoplasm of liver and intrahepatic bile duct: Secondary | ICD-10-CM | POA: Diagnosis not present

## 2018-02-11 DIAGNOSIS — L89152 Pressure ulcer of sacral region, stage 2: Secondary | ICD-10-CM | POA: Diagnosis not present

## 2018-02-11 DIAGNOSIS — C61 Malignant neoplasm of prostate: Secondary | ICD-10-CM | POA: Diagnosis not present

## 2018-02-12 IMAGING — NM NM BONE WHOLE BODY
1 series · 8 of 8 positions shown · non-contrast
Comparison: Bone scan 03/11/2016.

CLINICAL DATA: Prostate cancer with metastasis.

EXAM:
NUCLEAR MEDICINE WHOLE BODY BONE SCAN
TECHNIQUE: Whole body anterior and posterior images were obtained approximately
3 hours after intravenous injection of radiopharmaceutical.
RADIOPHARMACEUTICALS:  23.5 millicuries mCi Cechnetium-EEm MDP IV

[Series 1000: statics (reformatted series) · 2.40mm/px · 4 acquisitions, 8 frames shown]
[im 1/4]
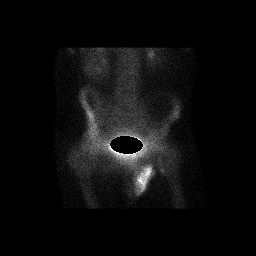
[im 1/4]
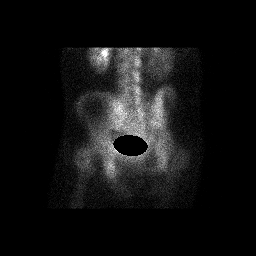
[im 2/4]
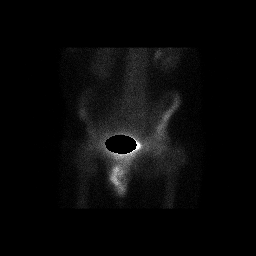
[im 2/4]
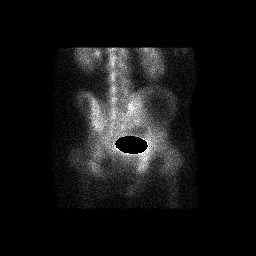
[im 3/4]
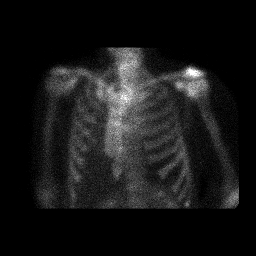
[im 3/4]
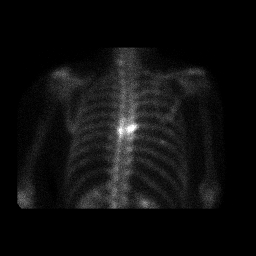
[im 4/4]
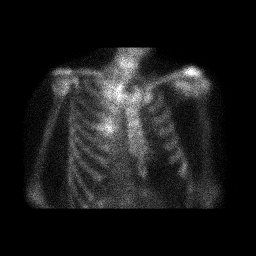
[im 4/4]
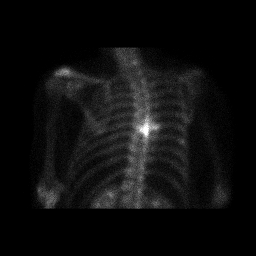

[8 of 8 positions shown; findings below may reference images not displayed]

FINDINGS: Bilateral renal function excretion. Intense increased activity noted
over a mid thoracic vertebral body and adjacent right posterior rib
in the region of prior abnormality. Findings are approximately the
T7 level . Activity has increased from prior exam. What appears to
be increased activity in the mid right posterior eighth rib noted on
today's exam. Progressive metastatic disease could present this
fashion .
IMPRESSION: Progressive abnormality noted mid thoracic spine and right posterior
rib at approximately the T7 level . Punctate area of increased
activity noted over a mid posterior right rib, most likely the
eighth rib. Findings consistent with progressive metastatic disease.

## 2018-02-15 DIAGNOSIS — C61 Malignant neoplasm of prostate: Secondary | ICD-10-CM | POA: Diagnosis not present

## 2018-02-15 DIAGNOSIS — L89152 Pressure ulcer of sacral region, stage 2: Secondary | ICD-10-CM | POA: Diagnosis not present

## 2018-02-15 DIAGNOSIS — C7951 Secondary malignant neoplasm of bone: Secondary | ICD-10-CM | POA: Diagnosis not present

## 2018-02-15 DIAGNOSIS — C787 Secondary malignant neoplasm of liver and intrahepatic bile duct: Secondary | ICD-10-CM | POA: Diagnosis not present

## 2018-02-15 DIAGNOSIS — R627 Adult failure to thrive: Secondary | ICD-10-CM | POA: Diagnosis not present

## 2018-02-15 DIAGNOSIS — R634 Abnormal weight loss: Secondary | ICD-10-CM | POA: Diagnosis not present

## 2018-02-16 DIAGNOSIS — L89152 Pressure ulcer of sacral region, stage 2: Secondary | ICD-10-CM | POA: Diagnosis not present

## 2018-02-16 DIAGNOSIS — R634 Abnormal weight loss: Secondary | ICD-10-CM | POA: Diagnosis not present

## 2018-02-16 DIAGNOSIS — R627 Adult failure to thrive: Secondary | ICD-10-CM | POA: Diagnosis not present

## 2018-02-16 DIAGNOSIS — C787 Secondary malignant neoplasm of liver and intrahepatic bile duct: Secondary | ICD-10-CM | POA: Diagnosis not present

## 2018-02-16 DIAGNOSIS — C61 Malignant neoplasm of prostate: Secondary | ICD-10-CM | POA: Diagnosis not present

## 2018-02-16 DIAGNOSIS — C7951 Secondary malignant neoplasm of bone: Secondary | ICD-10-CM | POA: Diagnosis not present

## 2018-02-19 DIAGNOSIS — C61 Malignant neoplasm of prostate: Secondary | ICD-10-CM | POA: Diagnosis not present

## 2018-02-19 DIAGNOSIS — L89152 Pressure ulcer of sacral region, stage 2: Secondary | ICD-10-CM | POA: Diagnosis not present

## 2018-02-19 DIAGNOSIS — C787 Secondary malignant neoplasm of liver and intrahepatic bile duct: Secondary | ICD-10-CM | POA: Diagnosis not present

## 2018-02-19 DIAGNOSIS — C7951 Secondary malignant neoplasm of bone: Secondary | ICD-10-CM | POA: Diagnosis not present

## 2018-02-19 DIAGNOSIS — R627 Adult failure to thrive: Secondary | ICD-10-CM | POA: Diagnosis not present

## 2018-02-19 DIAGNOSIS — R634 Abnormal weight loss: Secondary | ICD-10-CM | POA: Diagnosis not present

## 2018-02-25 DIAGNOSIS — L89152 Pressure ulcer of sacral region, stage 2: Secondary | ICD-10-CM | POA: Diagnosis not present

## 2018-02-25 DIAGNOSIS — M4800 Spinal stenosis, site unspecified: Secondary | ICD-10-CM | POA: Diagnosis not present

## 2018-02-25 DIAGNOSIS — R634 Abnormal weight loss: Secondary | ICD-10-CM | POA: Diagnosis not present

## 2018-02-25 DIAGNOSIS — K5900 Constipation, unspecified: Secondary | ICD-10-CM | POA: Diagnosis not present

## 2018-02-25 DIAGNOSIS — C7951 Secondary malignant neoplasm of bone: Secondary | ICD-10-CM | POA: Diagnosis not present

## 2018-02-25 DIAGNOSIS — I69352 Hemiplegia and hemiparesis following cerebral infarction affecting left dominant side: Secondary | ICD-10-CM | POA: Diagnosis not present

## 2018-02-25 DIAGNOSIS — R627 Adult failure to thrive: Secondary | ICD-10-CM | POA: Diagnosis not present

## 2018-02-25 DIAGNOSIS — Z681 Body mass index (BMI) 19 or less, adult: Secondary | ICD-10-CM | POA: Diagnosis not present

## 2018-02-25 DIAGNOSIS — C61 Malignant neoplasm of prostate: Secondary | ICD-10-CM | POA: Diagnosis not present

## 2018-02-25 DIAGNOSIS — C787 Secondary malignant neoplasm of liver and intrahepatic bile duct: Secondary | ICD-10-CM | POA: Diagnosis not present

## 2018-02-26 DIAGNOSIS — C61 Malignant neoplasm of prostate: Secondary | ICD-10-CM | POA: Diagnosis not present

## 2018-02-26 DIAGNOSIS — C787 Secondary malignant neoplasm of liver and intrahepatic bile duct: Secondary | ICD-10-CM | POA: Diagnosis not present

## 2018-02-26 DIAGNOSIS — L89152 Pressure ulcer of sacral region, stage 2: Secondary | ICD-10-CM | POA: Diagnosis not present

## 2018-02-26 DIAGNOSIS — C7951 Secondary malignant neoplasm of bone: Secondary | ICD-10-CM | POA: Diagnosis not present

## 2018-02-26 DIAGNOSIS — R627 Adult failure to thrive: Secondary | ICD-10-CM | POA: Diagnosis not present

## 2018-02-26 DIAGNOSIS — R634 Abnormal weight loss: Secondary | ICD-10-CM | POA: Diagnosis not present

## 2018-03-02 DIAGNOSIS — C787 Secondary malignant neoplasm of liver and intrahepatic bile duct: Secondary | ICD-10-CM | POA: Diagnosis not present

## 2018-03-02 DIAGNOSIS — R627 Adult failure to thrive: Secondary | ICD-10-CM | POA: Diagnosis not present

## 2018-03-02 DIAGNOSIS — C61 Malignant neoplasm of prostate: Secondary | ICD-10-CM | POA: Diagnosis not present

## 2018-03-02 DIAGNOSIS — R634 Abnormal weight loss: Secondary | ICD-10-CM | POA: Diagnosis not present

## 2018-03-02 DIAGNOSIS — L89152 Pressure ulcer of sacral region, stage 2: Secondary | ICD-10-CM | POA: Diagnosis not present

## 2018-03-02 DIAGNOSIS — C7951 Secondary malignant neoplasm of bone: Secondary | ICD-10-CM | POA: Diagnosis not present

## 2018-03-05 DIAGNOSIS — L89152 Pressure ulcer of sacral region, stage 2: Secondary | ICD-10-CM | POA: Diagnosis not present

## 2018-03-05 DIAGNOSIS — C787 Secondary malignant neoplasm of liver and intrahepatic bile duct: Secondary | ICD-10-CM | POA: Diagnosis not present

## 2018-03-05 DIAGNOSIS — R634 Abnormal weight loss: Secondary | ICD-10-CM | POA: Diagnosis not present

## 2018-03-05 DIAGNOSIS — C61 Malignant neoplasm of prostate: Secondary | ICD-10-CM | POA: Diagnosis not present

## 2018-03-05 DIAGNOSIS — C7951 Secondary malignant neoplasm of bone: Secondary | ICD-10-CM | POA: Diagnosis not present

## 2018-03-05 DIAGNOSIS — R627 Adult failure to thrive: Secondary | ICD-10-CM | POA: Diagnosis not present

## 2018-03-08 DIAGNOSIS — C61 Malignant neoplasm of prostate: Secondary | ICD-10-CM | POA: Diagnosis not present

## 2018-03-08 DIAGNOSIS — C7951 Secondary malignant neoplasm of bone: Secondary | ICD-10-CM | POA: Diagnosis not present

## 2018-03-08 DIAGNOSIS — R634 Abnormal weight loss: Secondary | ICD-10-CM | POA: Diagnosis not present

## 2018-03-08 DIAGNOSIS — R627 Adult failure to thrive: Secondary | ICD-10-CM | POA: Diagnosis not present

## 2018-03-08 DIAGNOSIS — C787 Secondary malignant neoplasm of liver and intrahepatic bile duct: Secondary | ICD-10-CM | POA: Diagnosis not present

## 2018-03-08 DIAGNOSIS — L89152 Pressure ulcer of sacral region, stage 2: Secondary | ICD-10-CM | POA: Diagnosis not present

## 2018-03-09 DIAGNOSIS — L89152 Pressure ulcer of sacral region, stage 2: Secondary | ICD-10-CM | POA: Diagnosis not present

## 2018-03-09 DIAGNOSIS — C7951 Secondary malignant neoplasm of bone: Secondary | ICD-10-CM | POA: Diagnosis not present

## 2018-03-09 DIAGNOSIS — C787 Secondary malignant neoplasm of liver and intrahepatic bile duct: Secondary | ICD-10-CM | POA: Diagnosis not present

## 2018-03-09 DIAGNOSIS — R627 Adult failure to thrive: Secondary | ICD-10-CM | POA: Diagnosis not present

## 2018-03-09 DIAGNOSIS — C61 Malignant neoplasm of prostate: Secondary | ICD-10-CM | POA: Diagnosis not present

## 2018-03-09 DIAGNOSIS — R634 Abnormal weight loss: Secondary | ICD-10-CM | POA: Diagnosis not present

## 2018-03-11 DIAGNOSIS — C787 Secondary malignant neoplasm of liver and intrahepatic bile duct: Secondary | ICD-10-CM | POA: Diagnosis not present

## 2018-03-11 DIAGNOSIS — C61 Malignant neoplasm of prostate: Secondary | ICD-10-CM | POA: Diagnosis not present

## 2018-03-11 DIAGNOSIS — L89152 Pressure ulcer of sacral region, stage 2: Secondary | ICD-10-CM | POA: Diagnosis not present

## 2018-03-11 DIAGNOSIS — R627 Adult failure to thrive: Secondary | ICD-10-CM | POA: Diagnosis not present

## 2018-03-11 DIAGNOSIS — C7951 Secondary malignant neoplasm of bone: Secondary | ICD-10-CM | POA: Diagnosis not present

## 2018-03-11 DIAGNOSIS — R634 Abnormal weight loss: Secondary | ICD-10-CM | POA: Diagnosis not present

## 2018-03-12 DIAGNOSIS — C7951 Secondary malignant neoplasm of bone: Secondary | ICD-10-CM | POA: Diagnosis not present

## 2018-03-12 DIAGNOSIS — C61 Malignant neoplasm of prostate: Secondary | ICD-10-CM | POA: Diagnosis not present

## 2018-03-12 DIAGNOSIS — R627 Adult failure to thrive: Secondary | ICD-10-CM | POA: Diagnosis not present

## 2018-03-12 DIAGNOSIS — L89152 Pressure ulcer of sacral region, stage 2: Secondary | ICD-10-CM | POA: Diagnosis not present

## 2018-03-12 DIAGNOSIS — C787 Secondary malignant neoplasm of liver and intrahepatic bile duct: Secondary | ICD-10-CM | POA: Diagnosis not present

## 2018-03-12 DIAGNOSIS — R634 Abnormal weight loss: Secondary | ICD-10-CM | POA: Diagnosis not present

## 2018-03-13 DIAGNOSIS — C61 Malignant neoplasm of prostate: Secondary | ICD-10-CM | POA: Diagnosis not present

## 2018-03-13 DIAGNOSIS — C7951 Secondary malignant neoplasm of bone: Secondary | ICD-10-CM | POA: Diagnosis not present

## 2018-03-13 DIAGNOSIS — C787 Secondary malignant neoplasm of liver and intrahepatic bile duct: Secondary | ICD-10-CM | POA: Diagnosis not present

## 2018-03-13 DIAGNOSIS — R627 Adult failure to thrive: Secondary | ICD-10-CM | POA: Diagnosis not present

## 2018-03-13 DIAGNOSIS — R634 Abnormal weight loss: Secondary | ICD-10-CM | POA: Diagnosis not present

## 2018-03-13 DIAGNOSIS — L89152 Pressure ulcer of sacral region, stage 2: Secondary | ICD-10-CM | POA: Diagnosis not present

## 2018-03-14 DIAGNOSIS — R634 Abnormal weight loss: Secondary | ICD-10-CM | POA: Diagnosis not present

## 2018-03-14 DIAGNOSIS — C7951 Secondary malignant neoplasm of bone: Secondary | ICD-10-CM | POA: Diagnosis not present

## 2018-03-14 DIAGNOSIS — L89152 Pressure ulcer of sacral region, stage 2: Secondary | ICD-10-CM | POA: Diagnosis not present

## 2018-03-14 DIAGNOSIS — C61 Malignant neoplasm of prostate: Secondary | ICD-10-CM | POA: Diagnosis not present

## 2018-03-14 DIAGNOSIS — R627 Adult failure to thrive: Secondary | ICD-10-CM | POA: Diagnosis not present

## 2018-03-14 DIAGNOSIS — C787 Secondary malignant neoplasm of liver and intrahepatic bile duct: Secondary | ICD-10-CM | POA: Diagnosis not present

## 2018-03-16 DIAGNOSIS — R634 Abnormal weight loss: Secondary | ICD-10-CM | POA: Diagnosis not present

## 2018-03-16 DIAGNOSIS — C61 Malignant neoplasm of prostate: Secondary | ICD-10-CM | POA: Diagnosis not present

## 2018-03-16 DIAGNOSIS — C7951 Secondary malignant neoplasm of bone: Secondary | ICD-10-CM | POA: Diagnosis not present

## 2018-03-16 DIAGNOSIS — L89152 Pressure ulcer of sacral region, stage 2: Secondary | ICD-10-CM | POA: Diagnosis not present

## 2018-03-16 DIAGNOSIS — R627 Adult failure to thrive: Secondary | ICD-10-CM | POA: Diagnosis not present

## 2018-03-16 DIAGNOSIS — C787 Secondary malignant neoplasm of liver and intrahepatic bile duct: Secondary | ICD-10-CM | POA: Diagnosis not present

## 2018-03-16 NOTE — Progress Notes (Deleted)
03/17/2018 7:21 PM   Genevieve Norlander. Apr 21, 1961 [id number]  Referring provider: No referring provider defined for this encounter.  No chief complaint on file.   HPI: Patient is a 57 -year-old Caucasian male who presents today for incontinence.  He has stage IV high risk prostate cancer with metastasis to liver and bone and is currently in hospice ***  Last PSA 170.    He has a history urinary retention and was instructed in [facility].    Patient states that he has had urinary incontinence for ***.  Patient has incontinence with ***.   She is experiencing *** incontinent episodes during the day. She is experiencing *** incontinent episodes during the night.  Her incontinence volume is ***.   She is wearing *** pads/depends daily.    She is having associated urinary frequency, urgency, dysuria, nocturia, intermittency, hesitancy, straining to urinate and weak urinary stream.   ***  Patient denies any gross hematuria, dysuria or suprapubic/flank pain.  Patient denies any fevers, chills, nausea or vomiting.   She admits to/denies constipation and/or diarrhea. ***  She is having/ not having pain with bladder filling.  ***  She has/not had any recent imaging studies.  ***  She is drinking *** of water daily.   She is drinking *** caffeinated beverages daily.  She is drinking *** alcoholic beverages daily.      PMH: Past Medical History:  Diagnosis Date  . Cancer of prostate ([place]) 12/09/2014  . Cerebral seizure   . Neuromuscular disorder ([place])    brain tumor at 57 years of age  . Stroke [facility])    at 57 years of age    Surgical History: No past surgical history on file.  Home Medications:  Allergies as of 03/17/2018   No Known Allergies     Medication List        Accurate as of 03/16/18  7:21 PM. Always use your most recent med list.          lactulose 10 GM/15ML solution Commonly known as:  CHRONULAC Take 15 mLs (10 g total) by mouth 2 (two) times daily as  needed for mild constipation.   morphine 15 MG 12 hr tablet Commonly known as:  MS CONTIN Take 1 tablet (15 mg total) by mouth every 12 (twelve) hours.   Oxycodone HCl 10 MG Tabs Take 1 tablet (10 mg total) by mouth every 6 (six) hours as needed.   prochlorperazine 10 MG tablet Commonly known as:  COMPAZINE Take 1 tablet (10 mg total) by mouth every 6 (six) hours as needed for nausea or vomiting.   senna-docusate 8.6-50 MG tablet Commonly known as:  Senokot-S Take by mouth.   tamsulosin 0.4 MG Caps capsule Commonly known as:  FLOMAX Take 1 capsule (0.4 mg total) by mouth daily.   traZODone 50 MG tablet Commonly known as:  DESYREL Take 1 tablet (50 mg total) by mouth at bedtime.       Allergies: No Known Allergies  Family History: No family history on file.  Social History:  reports that he has been smoking cigarettes. He has been smoking about 0.50 packs per day. He has never used smokeless tobacco. He reports that he does not drink alcohol or use drugs.  ROS:  Physical Exam: There were no vitals taken for this visit.  Constitutional: Well nourished. Alert and oriented, No acute distress. HEENT: [place] AT, moist mucus membranes. Trachea midline, no masses. Cardiovascular: No clubbing, cyanosis, or edema. Respiratory: Normal respiratory effort, no increased work of breathing. GI: Abdomen is soft, non tender, non distended, no abdominal masses. Liver and spleen not palpable.  No hernias appreciated.  Stool sample for occult testing is not indicated.   GU: No CVA tenderness.  No bladder fullness or masses.  GU: No CVA tenderness.  No bladder fullness or masses.  Patient with circumcised/uncircumcised phallus. ***Foreskin easily retracted***  Urethral meatus is patent.  No penile discharge. No penile lesions or rashes. Scrotum without lesions, cysts, rashes and/or edema.  Testicles are located scrotally bilaterally.  No masses are appreciated in the testicles. Left and right epididymis are normal. Rectal: Patient with  normal sphincter tone. Anus and perineum without scarring or rashes. No rectal masses are appreciated. Prostate is approximately *** grams, *** nodules are appreciated. Seminal vesicles are normal. Skin: No rashes, bruises or suspicious lesions. Lymph: No cervical or inguinal adenopathy. Neurologic: Grossly intact, no focal deficits, moving all 4 extremities. Psychiatric: Normal mood and affect.  Laboratory Data: Lab Results  Component Value Date   WBC 12.6 (H) 10/13/2017   HGB 7.6 (L) 10/13/2017   HCT 23.6 (L) 10/13/2017   MCV 74.6 (L) 10/13/2017   PLT 729 (H) 10/13/2017    Lab Results  Component Value Date   CREATININE 0.46 (L) 10/13/2017    Lab Results  Component Value Date   PSA 3.90 12/16/2016   PSA 1.17 09/18/2016   PSA 0.47 04/09/2016    No results found for: TESTOSTERONE  No results found for: HGBA1C  No results found for: TSH  No results found for: CHOL, HDL, CHOLHDL, VLDL, LDLCALC  Lab Results  Component Value Date   AST 22 10/13/2017   Lab Results  Component Value Date   ALT 15 (L) 10/13/2017   No components found for: ALKALINEPHOPHATASE No components found for: BILIRUBINTOTAL  No results found for: ESTRADIOL  Urinalysis    Component Value Date/Time   COLORURINE RED (A) 10/07/2017 1851   APPEARANCEUR CLOUDY (A) 10/07/2017 1851   APPEARANCEUR Clear 09/04/2017 1103   LABSPEC 1.034 (H) 10/07/2017 1851   LABSPEC 1.025 06/27/2014 0103   PHURINE  10/07/2017 1851    TEST NOT REPORTED DUE TO COLOR INTERFERENCE OF URINE PIGMENT   GLUCOSEU (A) 10/07/2017 1851    TEST NOT REPORTED DUE TO COLOR INTERFERENCE OF URINE PIGMENT   GLUCOSEU 50 mg/dL 06/27/2014 0103   HGBUR (A) 10/07/2017 1851    TEST NOT REPORTED DUE TO COLOR INTERFERENCE OF URINE PIGMENT   BILIRUBINUR (A) 10/07/2017 1851    TEST NOT REPORTED DUE TO COLOR INTERFERENCE OF URINE PIGMENT    BILIRUBINUR Negative 09/04/2017 1103   BILIRUBINUR Negative 06/27/2014 0103   KETONESUR (A) 10/07/2017 1851    TEST NOT REPORTED DUE TO COLOR INTERFERENCE OF URINE PIGMENT   PROTEINUR (A) 10/07/2017 1851    TEST NOT REPORTED DUE TO COLOR INTERFERENCE OF URINE PIGMENT   NITRITE (A) 10/07/2017 1851    TEST NOT REPORTED DUE TO COLOR INTERFERENCE OF URINE PIGMENT   LEUKOCYTESUR (A) 10/07/2017 1851    TEST NOT REPORTED DUE TO COLOR INTERFERENCE OF URINE PIGMENT   LEUKOCYTESUR Negative 09/04/2017 1103   LEUKOCYTESUR Negative 06/27/2014 0103    I have reviewed the labs.   Pertinent Imaging: ***    Assessment &  Plan:  ***  1. Incontinence ***  2. Metastatic prostate cancer ***     No follow-ups on file.  These notes generated with voice recognition software. I apologize for typographical errors.  Zara Council, [facility] [address], [place], [place] [address] [phone]

## 2018-03-17 ENCOUNTER — Ambulatory Visit: Payer: Medicare Other | Admitting: Urology

## 2018-03-17 DIAGNOSIS — C787 Secondary malignant neoplasm of liver and intrahepatic bile duct: Secondary | ICD-10-CM | POA: Diagnosis not present

## 2018-03-17 DIAGNOSIS — R634 Abnormal weight loss: Secondary | ICD-10-CM | POA: Diagnosis not present

## 2018-03-17 DIAGNOSIS — C61 Malignant neoplasm of prostate: Secondary | ICD-10-CM | POA: Diagnosis not present

## 2018-03-17 DIAGNOSIS — C7951 Secondary malignant neoplasm of bone: Secondary | ICD-10-CM | POA: Diagnosis not present

## 2018-03-17 DIAGNOSIS — R627 Adult failure to thrive: Secondary | ICD-10-CM | POA: Diagnosis not present

## 2018-03-17 DIAGNOSIS — L89152 Pressure ulcer of sacral region, stage 2: Secondary | ICD-10-CM | POA: Diagnosis not present

## 2018-03-18 DIAGNOSIS — L89152 Pressure ulcer of sacral region, stage 2: Secondary | ICD-10-CM | POA: Diagnosis not present

## 2018-03-18 DIAGNOSIS — R634 Abnormal weight loss: Secondary | ICD-10-CM | POA: Diagnosis not present

## 2018-03-18 DIAGNOSIS — C7951 Secondary malignant neoplasm of bone: Secondary | ICD-10-CM | POA: Diagnosis not present

## 2018-03-18 DIAGNOSIS — R627 Adult failure to thrive: Secondary | ICD-10-CM | POA: Diagnosis not present

## 2018-03-18 DIAGNOSIS — C787 Secondary malignant neoplasm of liver and intrahepatic bile duct: Secondary | ICD-10-CM | POA: Diagnosis not present

## 2018-03-18 DIAGNOSIS — C61 Malignant neoplasm of prostate: Secondary | ICD-10-CM | POA: Diagnosis not present

## 2018-03-19 DIAGNOSIS — R634 Abnormal weight loss: Secondary | ICD-10-CM | POA: Diagnosis not present

## 2018-03-19 DIAGNOSIS — C787 Secondary malignant neoplasm of liver and intrahepatic bile duct: Secondary | ICD-10-CM | POA: Diagnosis not present

## 2018-03-19 DIAGNOSIS — C7951 Secondary malignant neoplasm of bone: Secondary | ICD-10-CM | POA: Diagnosis not present

## 2018-03-19 DIAGNOSIS — L89152 Pressure ulcer of sacral region, stage 2: Secondary | ICD-10-CM | POA: Diagnosis not present

## 2018-03-19 DIAGNOSIS — R627 Adult failure to thrive: Secondary | ICD-10-CM | POA: Diagnosis not present

## 2018-03-19 DIAGNOSIS — C61 Malignant neoplasm of prostate: Secondary | ICD-10-CM | POA: Diagnosis not present

## 2018-03-20 ENCOUNTER — Telehealth: Payer: Self-pay | Admitting: Oncology

## 2018-03-20 DIAGNOSIS — C61 Malignant neoplasm of prostate: Secondary | ICD-10-CM | POA: Diagnosis not present

## 2018-03-20 DIAGNOSIS — R627 Adult failure to thrive: Secondary | ICD-10-CM | POA: Diagnosis not present

## 2018-03-20 DIAGNOSIS — C787 Secondary malignant neoplasm of liver and intrahepatic bile duct: Secondary | ICD-10-CM | POA: Diagnosis not present

## 2018-03-20 DIAGNOSIS — L89152 Pressure ulcer of sacral region, stage 2: Secondary | ICD-10-CM | POA: Diagnosis not present

## 2018-03-20 DIAGNOSIS — C7951 Secondary malignant neoplasm of bone: Secondary | ICD-10-CM | POA: Diagnosis not present

## 2018-03-20 DIAGNOSIS — R634 Abnormal weight loss: Secondary | ICD-10-CM | POA: Diagnosis not present

## 2018-03-20 NOTE — Telephone Encounter (Signed)
Hospice nurse called and report that patient had urinary symptoms and bladder spasm sensations.  UA was checked and per RN was positive.  Cultures pending.  Asked if okay to treat. Verbal order was given for Cipro 500 mg twice daily for 7 days, awaiting culture. RN to call if resistant to current antibiotics. Also give a verbal order for oxybutynin 5 mg as needed bladder spasm.

## 2018-03-21 DIAGNOSIS — L89152 Pressure ulcer of sacral region, stage 2: Secondary | ICD-10-CM | POA: Diagnosis not present

## 2018-03-21 DIAGNOSIS — C787 Secondary malignant neoplasm of liver and intrahepatic bile duct: Secondary | ICD-10-CM | POA: Diagnosis not present

## 2018-03-21 DIAGNOSIS — C61 Malignant neoplasm of prostate: Secondary | ICD-10-CM | POA: Diagnosis not present

## 2018-03-21 DIAGNOSIS — C7951 Secondary malignant neoplasm of bone: Secondary | ICD-10-CM | POA: Diagnosis not present

## 2018-03-21 DIAGNOSIS — R634 Abnormal weight loss: Secondary | ICD-10-CM | POA: Diagnosis not present

## 2018-03-21 DIAGNOSIS — R627 Adult failure to thrive: Secondary | ICD-10-CM | POA: Diagnosis not present

## 2018-03-22 DIAGNOSIS — R627 Adult failure to thrive: Secondary | ICD-10-CM | POA: Diagnosis not present

## 2018-03-22 DIAGNOSIS — C7951 Secondary malignant neoplasm of bone: Secondary | ICD-10-CM | POA: Diagnosis not present

## 2018-03-22 DIAGNOSIS — C787 Secondary malignant neoplasm of liver and intrahepatic bile duct: Secondary | ICD-10-CM | POA: Diagnosis not present

## 2018-03-22 DIAGNOSIS — C61 Malignant neoplasm of prostate: Secondary | ICD-10-CM | POA: Diagnosis not present

## 2018-03-22 DIAGNOSIS — R634 Abnormal weight loss: Secondary | ICD-10-CM | POA: Diagnosis not present

## 2018-03-22 DIAGNOSIS — L89152 Pressure ulcer of sacral region, stage 2: Secondary | ICD-10-CM | POA: Diagnosis not present

## 2018-03-23 DIAGNOSIS — R627 Adult failure to thrive: Secondary | ICD-10-CM | POA: Diagnosis not present

## 2018-03-23 DIAGNOSIS — C787 Secondary malignant neoplasm of liver and intrahepatic bile duct: Secondary | ICD-10-CM | POA: Diagnosis not present

## 2018-03-23 DIAGNOSIS — L89152 Pressure ulcer of sacral region, stage 2: Secondary | ICD-10-CM | POA: Diagnosis not present

## 2018-03-23 DIAGNOSIS — R634 Abnormal weight loss: Secondary | ICD-10-CM | POA: Diagnosis not present

## 2018-03-23 DIAGNOSIS — C7951 Secondary malignant neoplasm of bone: Secondary | ICD-10-CM | POA: Diagnosis not present

## 2018-03-23 DIAGNOSIS — C61 Malignant neoplasm of prostate: Secondary | ICD-10-CM | POA: Diagnosis not present

## 2018-03-24 DIAGNOSIS — R634 Abnormal weight loss: Secondary | ICD-10-CM | POA: Diagnosis not present

## 2018-03-24 DIAGNOSIS — C61 Malignant neoplasm of prostate: Secondary | ICD-10-CM | POA: Diagnosis not present

## 2018-03-24 DIAGNOSIS — R627 Adult failure to thrive: Secondary | ICD-10-CM | POA: Diagnosis not present

## 2018-03-24 DIAGNOSIS — C7951 Secondary malignant neoplasm of bone: Secondary | ICD-10-CM | POA: Diagnosis not present

## 2018-03-24 DIAGNOSIS — C787 Secondary malignant neoplasm of liver and intrahepatic bile duct: Secondary | ICD-10-CM | POA: Diagnosis not present

## 2018-03-24 DIAGNOSIS — L89152 Pressure ulcer of sacral region, stage 2: Secondary | ICD-10-CM | POA: Diagnosis not present

## 2018-03-28 DEATH — deceased
